# Patient Record
Sex: Male | Born: 1955 | Race: White | Hispanic: No | Marital: Married | Smoking: Never smoker
Health system: Southern US, Community
[De-identification: ages and names within clinical notes are randomized; demographics above are authoritative.]

## PROBLEM LIST (undated history)

## (undated) DIAGNOSIS — I10 Essential (primary) hypertension: Secondary | ICD-10-CM

## (undated) DIAGNOSIS — K219 Gastro-esophageal reflux disease without esophagitis: Secondary | ICD-10-CM

## (undated) DIAGNOSIS — Z978 Presence of other specified devices: Secondary | ICD-10-CM

## (undated) DIAGNOSIS — R339 Retention of urine, unspecified: Secondary | ICD-10-CM

## (undated) DIAGNOSIS — Z87442 Personal history of urinary calculi: Secondary | ICD-10-CM

## (undated) DIAGNOSIS — C61 Malignant neoplasm of prostate: Secondary | ICD-10-CM

## (undated) HISTORY — PX: OTHER SURGICAL HISTORY: SHX169

---

## 2001-03-06 ENCOUNTER — Emergency Department (HOSPITAL_COMMUNITY): Admission: EM | Admit: 2001-03-06 | Discharge: 2001-03-06 | Payer: Self-pay | Admitting: *Deleted

## 2005-05-19 ENCOUNTER — Ambulatory Visit (HOSPITAL_COMMUNITY): Admission: RE | Admit: 2005-05-19 | Discharge: 2005-05-19 | Payer: Self-pay | Admitting: Orthopedic Surgery

## 2005-05-19 ENCOUNTER — Ambulatory Visit (HOSPITAL_BASED_OUTPATIENT_CLINIC_OR_DEPARTMENT_OTHER): Admission: RE | Admit: 2005-05-19 | Discharge: 2005-05-19 | Payer: Self-pay | Admitting: Orthopedic Surgery

## 2007-12-20 ENCOUNTER — Encounter: Admission: RE | Admit: 2007-12-20 | Discharge: 2007-12-20 | Payer: Self-pay | Admitting: Family Medicine

## 2010-04-16 ENCOUNTER — Ambulatory Visit: Payer: Self-pay | Admitting: Diagnostic Radiology

## 2010-04-16 ENCOUNTER — Ambulatory Visit (HOSPITAL_BASED_OUTPATIENT_CLINIC_OR_DEPARTMENT_OTHER): Admission: RE | Admit: 2010-04-16 | Discharge: 2010-04-16 | Payer: Self-pay | Admitting: Family Medicine

## 2011-03-21 NOTE — Op Note (Signed)
NAME:  [name] [name], [name] [name]               ACCOUNT NO.:  [id number]   MEDICAL RECORD NO.:  [id number]          PATIENT TYPE:  AMB   LOCATION:  DSC                          FACILITY:  [facility]   PHYSICIAN:  Harvie Junior, M.D.   DATE OF BIRTH:  Mar 15, 1956   DATE OF PROCEDURE:  05/19/2005  DATE OF DISCHARGE:                                 OPERATIVE REPORT   PREOPERATIVE DIAGNOSIS:  Biceps tendon tear, complete, left.   POSTOPERATIVE DIAGNOSIS:  Biceps tendon tear, complete, left.   PRINCIPAL PROCEDURE:  Repair of left distal biceps tendon with an  EndoButton.   SURGEON:  Harvie Junior, M.D.   ASSISTANT:  Marshia Ly, P.A.   ANESTHESIA:  General.   BRIEF HISTORY:  Mr. [name] [name] is a 55 year old male with a long history of  having torn his left biceps tendon at work.  He ultimately was evaluated and  noted to have a distal biceps tendon tear.  He was seen by an outside  physician, who ordered an MRI which confirmed this.  We had a long talk  about treatment options and ultimately felt this needed to be addressed  soon.  We evaluated him under Workers Comp and told the Workers Insurance underwriter.  We wrote in our note that this needed to be done within a week  to 10 days, but Workers Comp decided to drag their feet for a month and then  ultimately he was brought to the operating room almost five weeks after his  injury.  At that point we knew that there was going to be significant  increased risk of dissection, significant increased risk of neurovascular  compromise and injury, but did feel that his results should be superior with  fixation versus leaving this alone.  He was brought to the operating room  for this procedure.   PROCEDURE:  The patient brought to the operating room and after adequate  anesthesia obtained with a general anesthetic, the patient was positioned  supine on the operating table.  The left arm was prepped and draped the  usual sterile fashion.  Following this, a  curved incision was made through  the armpit to allow easy and full access and exposure of the scarred area so  that we could identify the distal biceps well.  At this point a large scar  mass was identified as well as pseudotendon tracking down to the radial  tuberosity.  There was a portion of this scar tissue which tracked medially  and obviously was of some concern as to exactly what the structure was  tracking medially.  At this point the scar mass was opened slightly and as  we did this, identified the normal tendinous structure which was at this  level.  The scar was then dissected free with a pair of scissors to identify  the tendinous portion as it attached to the biceps, and a gloved finger was  used to free up the biceps and muscle approximately where it had also begun  to scar in.  This pseudotendon was very dense and very much concerning for  having neurovascular structures, although I felt that there were no  identifiable neurovascular structures in this large scar mass.  This portion  of the case was an additional 45 minutes of dissection and concern because  of the delay to surgical intervention, making this case much more difficult  with a much higher risk of neurovascular injury.  At any rate, once we were  able to free the tendon up and as best we could identify the structures,  although it was very difficult in the scar tissue that was there because of  the delay to surgery, we ultimately did a [place]-style stitch, which  addressed the biceps tendon very nicely, no bunching of the tendon, and was  able to pull that down quite well.  At this point, this #2 Fibrewire stitch  was passed through an EndoButton and then back up into the tendon and then  tied distally in the tendon so that there was no knot that needed to go down  into the tunnel.  Attention was turned to the radial tuberosity, where a  quarter-inch drill was used and two holes were made, and the bone bridge  was  connected with a rongeur.  All excess bone was irrigated out thoroughly at  this point.  At this point, the Beath needle puller was used to identify the  location of the radial tuberosity.  At this point, the Beath needle was  advanced through this hole in the radius and out the back and through the  skin.  This was overdrilled with the cannulated drill and then the  EndoButton was advanced with different-colored sutures through this hole  under direct fluoroscopic imaging with the arm flexed up.  The tendon was  advanced through and then the toggle sutures were toggled for the EndoButton  to fit down on the posterior radius.  Once this had been accomplished, the  arm was put through a range of motion.  Easy full extension could be  achieved, and flexion was without any concern.  At this point the wound was  copiously irrigated and suctioned dry.  A final check was made for this  area, but the scar tissue that was there was debrided and obviously at this  point, the skin was closed with 0 and 2-0 Vicryl and a 3-0 Maxon pull-out  suture.  Benzoin and Steri-Strips were applied and the patient was taken to  the recovery room, where he was noted to be in satisfactory condition with a  90 degree splint with the arm in slight supination.  At this point, the  estimated blood loss for the procedure was none.  The complications were  none identified, although again there was obvious concern for difficult  visualization and significant dissection of scar tissue and concern for  neurovascular structures in the front half of the forearm.       JLG/MEDQ  D:  05/19/2005  T:  05/20/2005  Job:  [id number]

## 2013-11-03 DIAGNOSIS — C61 Malignant neoplasm of prostate: Secondary | ICD-10-CM

## 2013-11-03 HISTORY — DX: Malignant neoplasm of prostate: C61

## 2014-05-29 ENCOUNTER — Other Ambulatory Visit: Payer: Self-pay | Admitting: Urology

## 2014-06-30 ENCOUNTER — Encounter (HOSPITAL_COMMUNITY): Payer: Self-pay | Admitting: Pharmacy Technician

## 2014-07-03 NOTE — Patient Instructions (Signed)
[name] [name]  07/03/2014   Your procedure is scheduled on:  07/12/2014    Report to [facility].  Follow the Signs to [place] at    1045    am  Call this number if you have problems the morning of surgery: [phone]   Remember:   Do not eat food or drink liquids after midnight.   Take these medicines the morning of surgery with A SIP OF WATER:    Do not wear jewelry,   Do not wear lotions, powders, or perfumes. , deodorant    Men may shave face and neck.  Do not bring valuables to the hospital.  Contacts, dentures or bridgework may not be worn into surgery.  Leave suitcase in the car. After surgery it may be brought to your room.  For patients admitted to the hospital, checkout time is 11:00 AM the day of  discharge.          Please read over the following fact sheets that you were given: [facility] - Preparing for Surgery Before surgery, you can play an important role.  Because skin is not sterile, your skin needs to be as free of germs as possible.  You can reduce the number of germs on your skin by washing with CHG (chlorahexidine gluconate) soap before surgery.  CHG is an antiseptic cleaner which kills germs and bonds with the skin to continue killing germs even after washing. Please DO NOT use if you have an allergy to CHG or antibacterial soaps.  If your skin becomes reddened/irritated stop using the CHG and inform your nurse when you arrive at Short Stay. Do not shave (including legs and underarms) for at least 48 hours prior to the first CHG shower.  You may shave your face/neck. Please follow these instructions carefully:  1.  Shower with CHG Soap the night before surgery and the  morning of Surgery.  2.  If you choose to wash your hair, wash your hair first as usual with your  normal  shampoo.  3.  After you shampoo, rinse your hair and body thoroughly to remove the  shampoo.                           4.  Use CHG as you would any other liquid soap.   You can apply chg directly  to the skin and wash                       Gently with a scrungie or clean washcloth.  5.  Apply the CHG Soap to your body ONLY FROM THE NECK DOWN.   Do not use on face/ open                           Wound or open sores. Avoid contact with eyes, ears mouth and genitals (private parts).                       Wash face,  Genitals (private parts) with your normal soap.             6.  Wash thoroughly, paying special attention to the area where your surgery  will be performed.  7.  Thoroughly rinse your body with warm water from the neck down.  8.  DO NOT shower/wash with your normal soap after using and  rinsing off  the CHG Soap.                9.  Pat yourself dry with a clean towel.            10.  Wear clean pajamas.            11.  Place clean sheets on your bed the night of your first shower and do not  sleep with pets. Day of Surgery : Do not apply any lotions/deodorants the morning of surgery.  Please wear clean clothes to the hospital/surgery center.  FAILURE TO FOLLOW THESE INSTRUCTIONS MAY RESULT IN THE CANCELLATION OF YOUR SURGERY PATIENT SIGNATURE_________________________________  NURSE SIGNATURE__________________________________  ________________________________________________________________________  WHAT IS A BLOOD TRANSFUSION? Blood Transfusion Information  A transfusion is the replacement of blood or some of its parts. Blood is made up of multiple cells which provide different functions.  Red blood cells carry oxygen and are used for blood loss replacement.  White blood cells fight against infection.  Platelets control bleeding.  Plasma helps clot blood.  Other blood products are available for specialized needs, such as hemophilia or other clotting disorders. BEFORE THE TRANSFUSION  Who gives blood for transfusions?   Healthy volunteers who are fully evaluated to make sure their blood is safe. This is blood bank blood. Transfusion  therapy is the safest it has ever been in the practice of medicine. Before blood is taken from a donor, a complete history is taken to make sure that person has no history of diseases nor engages in risky social behavior (examples are intravenous drug use or sexual activity with multiple partners). The donor's travel history is screened to minimize risk of transmitting infections, such as malaria. The donated blood is tested for signs of infectious diseases, such as HIV and hepatitis. The blood is then tested to be sure it is compatible with you in order to minimize the chance of a transfusion reaction. If you or a relative donates blood, this is often done in anticipation of surgery and is not appropriate for emergency situations. It takes many days to process the donated blood. RISKS AND COMPLICATIONS Although transfusion therapy is very safe and saves many lives, the main dangers of transfusion include:   Getting an infectious disease.  Developing a transfusion reaction. This is an allergic reaction to something in the blood you were given. Every precaution is taken to prevent this. The decision to have a blood transfusion has been considered carefully by your caregiver before blood is given. Blood is not given unless the benefits outweigh the risks. AFTER THE TRANSFUSION  Right after receiving a blood transfusion, you will usually feel much better and more energetic. This is especially true if your red blood cells have gotten low (anemic). The transfusion raises the level of the red blood cells which carry oxygen, and this usually causes an energy increase.  The nurse administering the transfusion will monitor you carefully for complications. HOME CARE INSTRUCTIONS  No special instructions are needed after a transfusion. You may find your energy is better. Speak with your caregiver about any limitations on activity for underlying diseases you may have. SEEK MEDICAL CARE IF:   Your condition is  not improving after your transfusion.  You develop redness or irritation at the intravenous (IV) site. SEEK IMMEDIATE MEDICAL CARE IF:  Any of the following symptoms occur over the next 12 hours:  Shaking chills.  You have a temperature by mouth above 102 F (38.9 C),  not controlled by medicine.  Chest, back, or muscle pain.  People around you feel you are not acting correctly or are confused.  Shortness of breath or difficulty breathing.  Dizziness and fainting.  You get a rash or develop hives.  You have a decrease in urine output.  Your urine turns a dark color or changes to pink, red, or brown. Any of the following symptoms occur over the next 10 days:  You have a temperature by mouth above 102 F (38.9 C), not controlled by medicine.  Shortness of breath.  Weakness after normal activity.  The white part of the eye turns yellow (jaundice).  You have a decrease in the amount of urine or are urinating less often.  Your urine turns a dark color or changes to pink, red, or brown. Document Released: 10/17/2000 Document Revised: 01/12/2012 Document Reviewed: 06/05/2008 ExitCare Patient Information 2014 [place].  _______________________________________________________________________, coughing and deep breathing exercises, leg exercises

## 2014-07-04 ENCOUNTER — Encounter (INDEPENDENT_AMBULATORY_CARE_PROVIDER_SITE_OTHER): Payer: Self-pay

## 2014-07-04 ENCOUNTER — Encounter (HOSPITAL_COMMUNITY): Payer: Self-pay

## 2014-07-04 ENCOUNTER — Encounter (HOSPITAL_COMMUNITY)
Admission: RE | Admit: 2014-07-04 | Discharge: 2014-07-04 | Disposition: A | Discharge status: Home / Self Care | Payer: Federal, State, Local not specified - PPO | Source: Ambulatory Visit | Attending: Urology | Admitting: Urology

## 2014-07-04 DIAGNOSIS — Z01818 Encounter for other preprocedural examination: Secondary | ICD-10-CM | POA: Insufficient documentation

## 2014-07-04 DIAGNOSIS — C61 Malignant neoplasm of prostate: Secondary | ICD-10-CM | POA: Insufficient documentation

## 2014-07-04 HISTORY — DX: Gastro-esophageal reflux disease without esophagitis: K21.9

## 2014-07-04 LAB — BASIC METABOLIC PANEL
ANION GAP: 14 (ref 5–15)
BUN: 13 mg/dL (ref 6–23)
CALCIUM: 9.7 mg/dL (ref 8.4–10.5)
CHLORIDE: 98 meq/L (ref 96–112)
CO2: 26 meq/L (ref 19–32)
CREATININE: 0.91 mg/dL (ref 0.50–1.35)
GFR calc Af Amer: >90 mL/min (ref >90)
GFR calc non Af Amer: >90 mL/min (ref >90)
Glucose, Bld: 105 mg/dL — ABNORMAL HIGH (ref 70–99)
Potassium: 4.4 mEq/L (ref 3.7–5.3)
SODIUM: 138 meq/L (ref 137–147)

## 2014-07-04 LAB — CBC
HEMATOCRIT: 48.9 % (ref 39.0–52.0)
HEMOGLOBIN: 17.6 g/dL — AB (ref 13.0–17.0)
MCH: 32.4 pg (ref 26.0–34.0)
MCHC: 36 g/dL (ref 30.0–36.0)
MCV: 90.1 fL (ref 78.0–100.0)
Platelets: 213 10*3/uL (ref 150–400)
RBC: 5.43 MIL/uL (ref 4.22–5.81)
RDW: 12.6 % (ref 11.5–15.5)
WBC: 5.6 10*3/uL (ref 4.0–10.5)

## 2014-07-04 NOTE — Progress Notes (Signed)
Pt scored 5 on stop bang apnea score tool on preadmit appt on 07/04/2014 [facility]

## 2014-07-12 ENCOUNTER — Inpatient Hospital Stay (HOSPITAL_COMMUNITY): Payer: Federal, State, Local not specified - PPO | Admitting: Anesthesiology

## 2014-07-12 ENCOUNTER — Encounter (HOSPITAL_COMMUNITY): Payer: Federal, State, Local not specified - PPO | Admitting: Anesthesiology

## 2014-07-12 ENCOUNTER — Encounter (HOSPITAL_COMMUNITY)
Admission: RE | Disposition: A | Discharge status: Home / Self Care | Payer: Self-pay | Source: Ambulatory Visit | Attending: Urology

## 2014-07-12 ENCOUNTER — Inpatient Hospital Stay (HOSPITAL_COMMUNITY)
Admission: RE | Admit: 2014-07-12 | Discharge: 2014-07-13 | DRG: 708 | Disposition: A | Discharge status: Home / Self Care | Payer: Federal, State, Local not specified - PPO | Source: Ambulatory Visit | Attending: Urology | Admitting: Urology

## 2014-07-12 ENCOUNTER — Encounter (HOSPITAL_COMMUNITY): Payer: Self-pay | Admitting: Registered Nurse

## 2014-07-12 DIAGNOSIS — Z860100 Personal history of colon polyps, unspecified: Secondary | ICD-10-CM

## 2014-07-12 DIAGNOSIS — C61 Malignant neoplasm of prostate: Principal | ICD-10-CM | POA: Diagnosis present

## 2014-07-12 DIAGNOSIS — K219 Gastro-esophageal reflux disease without esophagitis: Secondary | ICD-10-CM | POA: Diagnosis present

## 2014-07-12 DIAGNOSIS — Z87891 Personal history of nicotine dependence: Secondary | ICD-10-CM | POA: Diagnosis not present

## 2014-07-12 DIAGNOSIS — Z01812 Encounter for preprocedural laboratory examination: Secondary | ICD-10-CM | POA: Diagnosis not present

## 2014-07-12 DIAGNOSIS — N529 Male erectile dysfunction, unspecified: Secondary | ICD-10-CM | POA: Diagnosis present

## 2014-07-12 HISTORY — PX: ROBOT ASSISTED LAPAROSCOPIC RADICAL PROSTATECTOMY: SHX5141

## 2014-07-12 HISTORY — PX: LYMPHADENECTOMY: SHX5960

## 2014-07-12 LAB — TYPE AND SCREEN
ABO/RH(D): O POS
Antibody Screen: NEGATIVE

## 2014-07-12 LAB — ABO/RH: ABO/RH(D): O POS

## 2014-07-12 LAB — HEMOGLOBIN AND HEMATOCRIT, BLOOD
HCT: 43.8 % (ref 39.0–52.0)
Hemoglobin: 15.5 g/dL (ref 13.0–17.0)

## 2014-07-12 SURGERY — ROBOTIC ASSISTED LAPAROSCOPIC RADICAL PROSTATECTOMY
Anesthesia: General

## 2014-07-12 MED ORDER — ROCURONIUM BROMIDE 100 MG/10ML IV SOLN
INTRAVENOUS | Status: DC | PRN
  Administered 2014-07-12: 5 mg via INTRAVENOUS
  Administered 2014-07-12: 20 mg via INTRAVENOUS
  Administered 2014-07-12: 5 mg via INTRAVENOUS
  Administered 2014-07-12: 55 mg via INTRAVENOUS

## 2014-07-12 MED ORDER — HYDROMORPHONE HCL PF 1 MG/ML IJ SOLN
INTRAMUSCULAR | Status: DC | PRN
  Administered 2014-07-12: 0.5 mg via INTRAVENOUS
  Administered 2014-07-12: 1 mg via INTRAVENOUS
  Administered 2014-07-12: 0.5 mg via INTRAVENOUS

## 2014-07-12 MED ORDER — NEOSTIGMINE METHYLSULFATE 10 MG/10ML IV SOLN
INTRAVENOUS | Status: AC
  Filled 2014-07-12: qty 1

## 2014-07-12 MED ORDER — GLYCOPYRROLATE 0.2 MG/ML IJ SOLN
INTRAMUSCULAR | Status: AC
  Filled 2014-07-12: qty 3

## 2014-07-12 MED ORDER — SODIUM CHLORIDE 0.9 % IJ SOLN
INTRAMUSCULAR | Status: AC
  Filled 2014-07-12: qty 10

## 2014-07-12 MED ORDER — NEOSTIGMINE METHYLSULFATE 10 MG/10ML IV SOLN
INTRAVENOUS | Status: DC | PRN
  Administered 2014-07-12: 4 mg via INTRAVENOUS

## 2014-07-12 MED ORDER — STERILE WATER FOR IRRIGATION IR SOLN
Status: DC | PRN
  Administered 2014-07-12: 3000 mL

## 2014-07-12 MED ORDER — SUFENTANIL CITRATE 50 MCG/ML IV SOLN
INTRAVENOUS | Status: AC
  Filled 2014-07-12: qty 1

## 2014-07-12 MED ORDER — ONDANSETRON HCL 4 MG/2ML IJ SOLN
INTRAMUSCULAR | Status: DC | PRN
  Administered 2014-07-12: 4 mg via INTRAVENOUS

## 2014-07-12 MED ORDER — MEPERIDINE HCL 50 MG/ML IJ SOLN: 6.2500 mg | INTRAMUSCULAR | Status: DC | PRN

## 2014-07-12 MED ORDER — HYDROMORPHONE HCL PF 1 MG/ML IJ SOLN
0.2500 mg | INTRAMUSCULAR | Status: DC | PRN
  Administered 2014-07-12 (×2): 0.5 mg via INTRAVENOUS

## 2014-07-12 MED ORDER — MIDAZOLAM HCL 5 MG/5ML IJ SOLN
INTRAMUSCULAR | Status: DC | PRN
  Administered 2014-07-12: 2 mg via INTRAVENOUS

## 2014-07-12 MED ORDER — ACETAMINOPHEN 500 MG PO TABS
1000.0000 mg | ORAL_TABLET | Freq: Four times a day (QID) | ORAL | Status: AC
  Administered 2014-07-12 – 2014-07-13 (×4): 1000 mg via ORAL
  Filled 2014-07-12 (×4): qty 2

## 2014-07-12 MED ORDER — LACTATED RINGERS IV SOLN
INTRAVENOUS | Status: DC
Start: 2014-07-12 — End: 2014-07-12
  Administered 2014-07-12: 1000 mL via INTRAVENOUS

## 2014-07-12 MED ORDER — BUPIVACAINE LIPOSOME 1.3 % IJ SUSP
20.0000 mL | Freq: Once | INTRAMUSCULAR | Status: AC
  Administered 2014-07-12: 20 mL
  Filled 2014-07-12: qty 20

## 2014-07-12 MED ORDER — GLYCOPYRROLATE 0.2 MG/ML IJ SOLN
INTRAMUSCULAR | Status: DC | PRN
  Administered 2014-07-12: 0.6 mg via INTRAVENOUS

## 2014-07-12 MED ORDER — PANTOPRAZOLE SODIUM 40 MG PO TBEC
40.0000 mg | DELAYED_RELEASE_TABLET | Freq: Every day | ORAL | Status: DC
  Administered 2014-07-13: 40 mg via ORAL
  Filled 2014-07-12: qty 1

## 2014-07-12 MED ORDER — CEFAZOLIN SODIUM-DEXTROSE 2-3 GM-% IV SOLR
INTRAVENOUS | Status: AC
  Filled 2014-07-12: qty 50

## 2014-07-12 MED ORDER — LABETALOL HCL 5 MG/ML IV SOLN
INTRAVENOUS | Status: AC
  Filled 2014-07-12: qty 4

## 2014-07-12 MED ORDER — SODIUM CHLORIDE 0.9 % IV BOLUS (SEPSIS)
1000.0000 mL | Freq: Once | INTRAVENOUS | Status: AC
  Administered 2014-07-12: 1000 mL via INTRAVENOUS

## 2014-07-12 MED ORDER — FENTANYL CITRATE 0.05 MG/ML IJ SOLN: INTRAMUSCULAR | Status: DC | PRN

## 2014-07-12 MED ORDER — ONDANSETRON HCL 4 MG/2ML IJ SOLN
INTRAMUSCULAR | Status: AC
  Filled 2014-07-12: qty 2

## 2014-07-12 MED ORDER — HYDROMORPHONE HCL PF 1 MG/ML IJ SOLN: 0.5000 mg | INTRAMUSCULAR | Status: DC | PRN

## 2014-07-12 MED ORDER — SUFENTANIL CITRATE 50 MCG/ML IV SOLN
INTRAVENOUS | Status: DC | PRN
  Administered 2014-07-12 (×4): 10 ug via INTRAVENOUS
  Administered 2014-07-12: 5 ug via INTRAVENOUS
  Administered 2014-07-12: 10 ug via INTRAVENOUS
  Administered 2014-07-12: 15 ug via INTRAVENOUS

## 2014-07-12 MED ORDER — PROPOFOL 10 MG/ML IV BOLUS
INTRAVENOUS | Status: AC
  Filled 2014-07-12: qty 20

## 2014-07-12 MED ORDER — HYDROMORPHONE HCL PF 1 MG/ML IJ SOLN
INTRAMUSCULAR | Status: AC
  Filled 2014-07-12: qty 1

## 2014-07-12 MED ORDER — CIPROFLOXACIN HCL 500 MG PO TABS: 500.0000 mg | ORAL_TABLET | Freq: Two times a day (BID) | ORAL | Status: DC

## 2014-07-12 MED ORDER — DEXAMETHASONE SODIUM PHOSPHATE 10 MG/ML IJ SOLN
INTRAMUSCULAR | Status: DC | PRN
  Administered 2014-07-12: 10 mg via INTRAVENOUS

## 2014-07-12 MED ORDER — PROPOFOL 10 MG/ML IV BOLUS
INTRAVENOUS | Status: DC | PRN
  Administered 2014-07-12: 200 mg via INTRAVENOUS

## 2014-07-12 MED ORDER — LACTATED RINGERS IR SOLN
Status: DC | PRN
  Administered 2014-07-12: 1000 mL

## 2014-07-12 MED ORDER — PROMETHAZINE HCL 25 MG/ML IJ SOLN: 6.2500 mg | INTRAMUSCULAR | Status: DC | PRN

## 2014-07-12 MED ORDER — MIDAZOLAM HCL 2 MG/2ML IJ SOLN
INTRAMUSCULAR | Status: AC
  Filled 2014-07-12: qty 2

## 2014-07-12 MED ORDER — DEXAMETHASONE SODIUM PHOSPHATE 10 MG/ML IJ SOLN
INTRAMUSCULAR | Status: AC
  Filled 2014-07-12: qty 1

## 2014-07-12 MED ORDER — LACTATED RINGERS IV SOLN: INTRAVENOUS | Status: DC

## 2014-07-12 MED ORDER — LABETALOL HCL 5 MG/ML IV SOLN
INTRAVENOUS | Status: DC | PRN
  Administered 2014-07-12 (×2): 5 mg via INTRAVENOUS

## 2014-07-12 MED ORDER — SODIUM CHLORIDE 0.9 % IJ SOLN
INTRAMUSCULAR | Status: DC | PRN
  Administered 2014-07-12: 20 mL via INTRAVENOUS

## 2014-07-12 MED ORDER — LIDOCAINE HCL (CARDIAC) 20 MG/ML IV SOLN
INTRAVENOUS | Status: DC | PRN
  Administered 2014-07-12: 75 mg via INTRAVENOUS
  Administered 2014-07-12: 25 mg via INTRATRACHEAL

## 2014-07-12 MED ORDER — DEXTROSE-NACL 5-0.45 % IV SOLN
INTRAVENOUS | Status: DC
  Administered 2014-07-12 – 2014-07-13 (×2): via INTRAVENOUS

## 2014-07-12 MED ORDER — ROCURONIUM BROMIDE 100 MG/10ML IV SOLN
INTRAVENOUS | Status: AC
  Filled 2014-07-12: qty 1

## 2014-07-12 MED ORDER — LACTATED RINGERS IV SOLN
INTRAVENOUS | Status: DC | PRN
  Administered 2014-07-12 (×2): via INTRAVENOUS

## 2014-07-12 MED ORDER — OMEPRAZOLE MAGNESIUM 20 MG PO TBEC: 20.0000 mg | DELAYED_RELEASE_TABLET | Freq: Every day | ORAL | Status: DC

## 2014-07-12 MED ORDER — HYDROMORPHONE HCL PF 2 MG/ML IJ SOLN
INTRAMUSCULAR | Status: AC
  Filled 2014-07-12: qty 1

## 2014-07-12 MED ORDER — LIDOCAINE HCL (CARDIAC) 20 MG/ML IV SOLN
INTRAVENOUS | Status: AC
  Filled 2014-07-12: qty 5

## 2014-07-12 MED ORDER — OXYCODONE-ACETAMINOPHEN 5-325 MG PO TABS
1.0000 | ORAL_TABLET | ORAL | Status: DC | PRN
  Filled 2014-07-12: qty 1

## 2014-07-12 MED ORDER — SUCCINYLCHOLINE CHLORIDE 20 MG/ML IJ SOLN
INTRAMUSCULAR | Status: DC | PRN
  Administered 2014-07-12: 100 mg via INTRAVENOUS

## 2014-07-12 MED ORDER — HYDROCODONE-ACETAMINOPHEN 5-325 MG PO TABS: 1.0000 | ORAL_TABLET | Freq: Four times a day (QID) | ORAL | Status: DC | PRN

## 2014-07-12 MED ORDER — CEFAZOLIN SODIUM-DEXTROSE 2-3 GM-% IV SOLR
2.0000 g | INTRAVENOUS | Status: AC
  Administered 2014-07-12: 2 g via INTRAVENOUS

## 2014-07-12 SURGICAL SUPPLY — 55 items
ADH SKN CLS APL DERMABOND .7 (GAUZE/BANDAGES/DRESSINGS) ×2
CABLE HIGH FREQUENCY MONO STRZ (ELECTRODE) ×4 IMPLANT
CANISTER SUCTION 2500CC (MISCELLANEOUS) ×2 IMPLANT
CATH FOLEY 2WAY SLVR 18FR 30CC (CATHETERS) ×4 IMPLANT
CATH TIEMANN FOLEY 18FR 5CC (CATHETERS) ×4 IMPLANT
CHLORAPREP W/TINT 26ML (MISCELLANEOUS) ×4 IMPLANT
CLIP LIGATING HEM O LOK PURPLE (MISCELLANEOUS) ×14 IMPLANT
CLOTH BEACON ORANGE TIMEOUT ST (SAFETY) ×4 IMPLANT
CONT SPEC 4OZ CLIKSEAL STRL BL (MISCELLANEOUS) ×2 IMPLANT
COVER SURGICAL LIGHT HANDLE (MISCELLANEOUS) ×4 IMPLANT
COVER TIP SHEARS 8 DVNC (MISCELLANEOUS) ×2 IMPLANT
COVER TIP SHEARS 8MM DA VINCI (MISCELLANEOUS) ×2
CUTTER ECHEON FLEX ENDO 45 340 (ENDOMECHANICALS) ×4 IMPLANT
DECANTER SPIKE VIAL GLASS SM (MISCELLANEOUS) ×2 IMPLANT
DERMABOND ADVANCED (GAUZE/BANDAGES/DRESSINGS) ×2
DERMABOND ADVANCED .7 DNX12 (GAUZE/BANDAGES/DRESSINGS) ×2 IMPLANT
DRSG TEGADERM 4X4.75 (GAUZE/BANDAGES/DRESSINGS) ×6 IMPLANT
DRSG TEGADERM 6X8 (GAUZE/BANDAGES/DRESSINGS) ×4 IMPLANT
ELECT REM PT RETURN 9FT ADLT (ELECTROSURGICAL) ×4
ELECTRODE REM PT RTRN 9FT ADLT (ELECTROSURGICAL) ×2 IMPLANT
GAUZE SPONGE 2X2 8PLY STRL LF (GAUZE/BANDAGES/DRESSINGS) ×2 IMPLANT
GLOVE BIO SURGEON STRL SZ 6.5 (GLOVE) ×3 IMPLANT
GLOVE BIO SURGEONS STRL SZ 6.5 (GLOVE) ×1
GLOVE BIOGEL M STRL SZ7.5 (GLOVE) ×8 IMPLANT
GLOVE BIOGEL PI IND STRL 7.5 (GLOVE) IMPLANT
GLOVE BIOGEL PI INDICATOR 7.5 (GLOVE) ×2
GOWN STRL REUS W/TWL LRG LVL4 (GOWN DISPOSABLE) ×12 IMPLANT
HOLDER FOLEY CATH W/STRAP (MISCELLANEOUS) ×4 IMPLANT
IV LACTATED RINGERS 1000ML (IV SOLUTION) ×2 IMPLANT
KIT ACCESSORY DA VINCI DISP (KITS) ×2
KIT ACCESSORY DVNC DISP (KITS) IMPLANT
KIT PROCEDURE DA VINCI SI (MISCELLANEOUS) ×2
KIT PROCEDURE DVNC SI (MISCELLANEOUS) ×2 IMPLANT
NDL INSUFFLATION 14GA 120MM (NEEDLE) ×2 IMPLANT
NEEDLE INSUFFLATION 14GA 120MM (NEEDLE) ×4 IMPLANT
NEEDLE SPNL 22GX7 SPINOC (NEEDLE) IMPLANT
PACK ROBOT UROLOGY CUSTOM (CUSTOM PROCEDURE TRAY) ×4 IMPLANT
RELOAD GREEN ECHELON 45 (STAPLE) ×4 IMPLANT
SET TUBE IRRIG SUCTION NO TIP (IRRIGATION / IRRIGATOR) ×4 IMPLANT
SOLUTION ELECTROLUBE (MISCELLANEOUS) ×4 IMPLANT
SPONGE GAUZE 2X2 STER 10/PKG (GAUZE/BANDAGES/DRESSINGS) ×2
SPONGE LAP 4X18 X RAY DECT (DISPOSABLE) ×4 IMPLANT
SUT ETHILON 3 0 PS 1 (SUTURE) ×4 IMPLANT
SUT MNCRL AB 4-0 PS2 18 (SUTURE) ×8 IMPLANT
SUT PDS AB 1 CT1 27 (SUTURE) ×8 IMPLANT
SUT VIC AB 2-0 SH 27 (SUTURE) ×4
SUT VIC AB 2-0 SH 27X BRD (SUTURE) ×2 IMPLANT
SUT VICRYL 0 UR6 27IN ABS (SUTURE) ×4 IMPLANT
SUT VLOC BARB 180 ABS3/0GR12 (SUTURE) ×24
SUTURE VLOC BRB 180 ABS3/0GR12 (SUTURE) ×6 IMPLANT
SYR 27GX1/2 1ML LL SAFETY (SYRINGE) ×4 IMPLANT
TOWEL OR 17X26 10 PK STRL BLUE (TOWEL DISPOSABLE) ×4 IMPLANT
TOWEL OR NON WOVEN STRL DISP B (DISPOSABLE) ×4 IMPLANT
TROCAR 12M 150ML BLUNT (TROCAR) ×4 IMPLANT
WATER STERILE IRR 1500ML POUR (IV SOLUTION) ×4 IMPLANT

## 2014-07-12 NOTE — Brief Op Note (Signed)
07/12/2014  4:11 PM  PATIENT:  [name] [name]  58 y.o. male  PRE-OPERATIVE DIAGNOSIS:  PROSTATE CANCER  POST-OPERATIVE DIAGNOSIS:  PROSTATE CANCER  PROCEDURE:  Procedure(s) with comments: ROBOTIC ASSISTED LAPAROSCOPIC RADICAL PROSTATECTOMY , INDOCYANINE GREEN DYE INJECTION (N/A) - 3.5 HRS  PELVIC LYMPH NODE DISSECTION (Bilateral)  SURGEON:  Surgeon(s) and Role:    * Alexis Frock, MD - Primary  PHYSICIAN ASSISTANT:   ASSISTANTS: Felipa Furnace, PA   ANESTHESIA:   general  EBL:  Total I/O In: 1000 [I.V.:1000] Out: 250 [Blood:250]  BLOOD ADMINISTERED:none  DRAINS: 1 - foley, 2 - JP to bulb   LOCAL MEDICATIONS USED:  MARCAINE     SPECIMEN:  Source of Specimen:  1 - prostatectomy, 2 - revised bladder neck margin, 3 - bilateral pelvic lymph nodes, 4 - periprostatic fat, 5 - left pervesical lymph nodes sentinal  DISPOSITION OF SPECIMEN:  PATHOLOGY  COUNTS:  YES  TOURNIQUET:  * No tourniquets in log *  DICTATION: .Other Dictation: Dictation Number [id number]  PLAN OF CARE: Admit to inpatient   PATIENT DISPOSITION:  PACU - hemodynamically stable.   Delay start of Pharmacological VTE agent (>24hrs) due to surgical blood loss or risk of bleeding: yes

## 2014-07-12 NOTE — Anesthesia Postprocedure Evaluation (Signed)
  Anesthesia Post-op Note  Patient: [name] [name]  Procedure(s) Performed: Procedure(s) (LRB): ROBOTIC ASSISTED LAPAROSCOPIC RADICAL PROSTATECTOMY , INDOCYANINE GREEN DYE INJECTION (N/A) PELVIC LYMPH NODE DISSECTION (Bilateral)  Patient Location: PACU  Anesthesia Type: General  Level of Consciousness: awake and alert   Airway and Oxygen Therapy: Patient Spontanous Breathing  Post-op Pain: mild  Post-op Assessment: Post-op Vital signs reviewed, Patient's Cardiovascular Status Stable, Respiratory Function Stable, Patent Airway and No signs of Nausea or vomiting  Last Vitals:  Filed Vitals:   07/12/14 1700  BP: 140/79  Pulse: 67  Temp: 37.1 C  Resp: 13    Post-op Vital Signs: stable   Complications: No apparent anesthesia complications

## 2014-07-12 NOTE — Discharge Instructions (Signed)

## 2014-07-12 NOTE — Anesthesia Preprocedure Evaluation (Signed)

## 2014-07-12 NOTE — H&P (Signed)
[name] [name] is an 58 y.o. male.    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 TRUS 46mL. Biopsy 05/2014 with 4+3=LLB, LLM; 3+4=7 RLB, RMM; 3+3=6 RMB, RMM, LMM. [facility] nomogram predicts 75% chance ECE, 49% 5 year progression free, 22% chance + nodes.  2 - Erectile Dysfunction -  Presently manages wtih Levtira 10mg  prn per PCP with satisfaciton.  PMH sig for colon polyps, GERD. No CV disease. No prior chest / abd surgeries.  Today [name] [name] is seen in f/u above and discuss new prostate cancer diagnosis.   Past Medical History  Diagnosis Date  . GERD (gastroesophageal reflux disease)   . Cancer     prostate    Past Surgical History  Procedure Laterality Date  . Left bicep surgery      reattached  . Mucous seal      removed from lip    No family history on file. Social History:  reports that he has never smoked. He quit smokeless tobacco use about 20 years ago. His smokeless tobacco use included Chew. He reports that he drinks about 1.2 ounces of alcohol per week. He reports that he does not use illicit drugs.  Allergies:  Allergies  Allergen Reactions  . Tetracyclines & Related Rash    No prescriptions prior to admission    No results found for this or any previous visit (from the past 48 hour(s)). No results found.  Review of Systems  Constitutional: Negative.  Negative for fever and chills.  HENT: Negative.   Eyes: Negative.   Respiratory: Negative.   Cardiovascular: Negative.   Gastrointestinal: Negative.  Negative for nausea and vomiting.  Genitourinary: Negative.   Musculoskeletal: Negative.   Skin: Negative.   Neurological: Negative.   Endo/Heme/Allergies: Negative.   Psychiatric/Behavioral: Negative.     There were no vitals taken for this visit. Physical Exam  Constitutional: He is oriented to person, place, and time. He  appears well-developed.  HENT:  Head: Normocephalic.  Eyes: Pupils are equal, round, and reactive to light.  Neck: Normal range of motion.  Cardiovascular: Normal rate.   Respiratory: Effort normal.  GI: Soft.  Genitourinary:  No CVAT  Musculoskeletal: Normal range of motion.  Neurological: He is alert and oriented to person, place, and time.  Skin: Skin is warm and dry.  Psychiatric: He has a normal mood and affect. His behavior is normal. Judgment and thought content normal.     Assessment/Plan  1 - Large Volume Moderate Risk Prostate Cancer -  We rediscussed prostatectomy and specifically robotic prostatectomy with bilateral pelvic lymphadenectomy being the technique that I most commonly perform. I showed the patient on their abdomen the approximately 6 small incision (trocar) sites as well as presumed extraction sites with robotic approach as well as possible open incision sites should open conversion be necessary. We rediscussed peri-operative risks including bleeding, infection, deep vein thrombosis, pulmonary embolism, compartment syndrome, nuropathy / neuropraxia, heart attack, stroke, death, as well as long-term risks such as non-cure / need for additional therapy. We specifically readdressed that the procedure would compromise urinary control leading to stress incontinence which typically resolves with time and pelvic rehabilitation (Kegel's, etc..), but can sometimes be permanent and require additional therapy including surgery. We also specifically readdressed sexual sequellae including significant erectile dysfunction which typically partially resolves with time but can also be permanent and require  additional therapy including surgery.   We rediscussed the typical hospital course including usual 1-2 night hospitalization, discharge with foley catheter in place usually for 1-2 weeks before voiding trial as well as usually 2 week recovery until able to perform most non-strenuous  activity and 6 weeks until able to return to most jobs and more strenuous activity such as exercise.    2 - Erectile Dysfunction - Continue Levitra. We briefly discussed generic sildenefil as an option as well. We also mentioned that ANY active treatment of his prostate cancer will worsen this some.     Milon Dethloff 07/12/2014, 6:42 AM

## 2014-07-12 NOTE — Transfer of Care (Signed)
Immediate Anesthesia Transfer of Care Note  Patient: [name] [name]  Procedure(s) Performed: Procedure(s) with comments: ROBOTIC ASSISTED LAPAROSCOPIC RADICAL PROSTATECTOMY , INDOCYANINE GREEN DYE INJECTION (N/A) - 3.5 HRS  PELVIC LYMPH NODE DISSECTION (Bilateral)  Patient Location: PACU  Anesthesia Type:General  Level of Consciousness: awake, alert , oriented and patient cooperative  Airway & Oxygen Therapy: Patient Spontanous Breathing and Patient connected to face mask oxygen  Post-op Assessment: Report given to PACU RN and Post -op Vital signs reviewed and stable  Post vital signs: Reviewed and stable  Complications: No apparent anesthesia complications

## 2014-07-13 ENCOUNTER — Encounter (HOSPITAL_COMMUNITY): Payer: Self-pay | Admitting: Urology

## 2014-07-13 LAB — HEMOGLOBIN AND HEMATOCRIT, BLOOD
HEMATOCRIT: 40.4 % (ref 39.0–52.0)
Hemoglobin: 14.1 g/dL (ref 13.0–17.0)

## 2014-07-13 LAB — BASIC METABOLIC PANEL
Anion gap: 11 (ref 5–15)
BUN: 10 mg/dL (ref 6–23)
CO2: 23 meq/L (ref 19–32)
CREATININE: 0.89 mg/dL (ref 0.50–1.35)
Calcium: 8.5 mg/dL (ref 8.4–10.5)
Chloride: 97 mEq/L (ref 96–112)
GFR calc non Af Amer: >90 mL/min (ref >90)
GLUCOSE: 147 mg/dL — AB (ref 70–99)
POTASSIUM: 4.1 meq/L (ref 3.7–5.3)
Sodium: 131 mEq/L — ABNORMAL LOW (ref 137–147)

## 2014-07-13 MED ORDER — ONDANSETRON HCL 4 MG/2ML IJ SOLN
4.0000 mg | Freq: Four times a day (QID) | INTRAMUSCULAR | Status: DC | PRN
  Administered 2014-07-13: 4 mg via INTRAVENOUS
  Filled 2014-07-13: qty 2

## 2014-07-13 MED ORDER — SENNOSIDES-DOCUSATE SODIUM 8.6-50 MG PO TABS: 1.0000 | ORAL_TABLET | Freq: Two times a day (BID) | ORAL | Status: DC

## 2014-07-13 NOTE — Progress Notes (Signed)
Patient discharged home with wife, discharge instructions given and explained to patient/wife, demonstrated foley/leg bag management/care at home and they verbalized understanding, patient denies any pain/distress. Surgical incision intact, no sign of infection noted, no other wound noted. Accompanied home by son and wife.

## 2014-07-13 NOTE — Discharge Summary (Signed)
Physician Discharge Summary  Patient ID: [name] [name] MRN: [id number] DOB/AGE: 12-08-1955 58 y.o.  Admit date: 07/12/2014 Discharge date: 07/13/2014  Admission Diagnoses: Prostate Cancer  Discharge Diagnoses:  Active Problems:   Malignant neoplasm of prostate   Discharged Condition: good  Hospital Course:   1 - Moderate Risk Prostate Cancer - s/p robotic prostatectomy 07/12/2014. Path pending. By the afternoon of 9/10 the patient is ambulatory, pain controlled with PO meds, tollerating PO intake, and felt to be adequate for discharge. JP removed prior to discharge as output scant.    Consults: None  Significant Diagnostic Studies: labs: Hgb >12.  Treatments: surgery:  robotic prostatectomy 07/12/2014  Discharge Exam: Blood pressure 148/61, pulse 76, temperature 98.4 F (36.9 C), temperature source Oral, resp. rate 19, height 5\' 8"  (1.727 m), weight 88.451 kg (195 lb), SpO2 100.00%. General appearance: alert, cooperative, appears stated age and wife at bedside Head: Normocephalic, without obvious abnormality, atraumatic Throat: lips, mucosa, and tongue normal; teeth and gums normal Neck: supple, symmetrical, trachea midline Back: symmetric, no curvature. ROM normal. No CVA tenderness. Resp: non-labored on room air Cardio: Nl rate GI: soft, non-tender; bowel sounds normal; no masses,  no organomegaly Male genitalia: normal, foley c/d/i with clear urine Extremities: extremities normal, atraumatic, no cyanosis or edema Pulses: 2+ and symmetric Skin: Skin color, texture, turgor normal. No rashes or lesions Lymph nodes: Cervical, supraclavicular, and axillary nodes normal. Neurologic: Grossly normal Incision/Wound: recent port sites, extraction site c/d/i. JP removed and dry dressing applied.   Disposition: Final discharge disposition not confirmed     Medication List         ciprofloxacin 500 MG tablet  Commonly known as:  CIPRO  Take 1 tablet (500 mg total) by mouth 2  (two) times daily. Start day prior to office visit for foley removal     HYDROcodone-acetaminophen 5-325 MG per tablet  Commonly known as:  NORCO  Take 1-2 tablets by mouth every 6 (six) hours as needed.     omeprazole 20 MG tablet  Commonly known as:  PRILOSEC OTC  Take 20 mg by mouth daily.     senna-docusate 8.6-50 MG per tablet  Commonly known as:  Senokot-S  Take 1 tablet by mouth 2 (two) times daily. While taking pain meds to prevent constipation           Follow-up Information   Follow up with Alexis Frock, MD On 07/20/2014. (9:15)    Specialty:  Urology   Contact information:   [address] [place] [address] [phone]       Signed: Alexis Frock 07/13/2014, 4:12 PM

## 2014-07-13 NOTE — Care Management Note (Signed)
    Page 1 of 1   07/13/2014     2:24:07 PM CARE MANAGEMENT NOTE 07/13/2014  Patient:  [name] [name], [name] [name]   Account Number:  [id number]  Date Initiated:  07/13/2014  Documentation initiated by:  [facility]  Subjective/Objective Assessment:   [address] [place]     Action/Plan:   FROM HOME.   Anticipated DC Date:  07/14/2014   Anticipated DC Plan:  [place]  CM consult      Choice offered to / List presented to:             Status of service:  In process, will continue to follow Medicare Important Message given?   (If response is "NO", the following Medicare IM given date fields will be blank) Date Medicare IM given:   Medicare IM given by:   Date Additional Medicare IM given:   Additional Medicare IM given by:    Discharge Disposition:    Per UR Regulation:  Reviewed for med. necessity/level of care/duration of stay  If discussed at [place] of Stay Meetings, dates discussed:    Comments:  07/13/14 Webster Patrone RN,BSN [facility] [phone] POD#1 LAP RAD PROSTATECTOMY.NO ANTICIPATED D/C NEEDS.

## 2014-07-13 NOTE — Op Note (Signed)
NAMEHARPER, SMOKER NO.:  [id number]  MEDICAL RECORD NO.:  [id number]  LOCATION:  2992                         FACILITY:  [facility]  PHYSICIAN:  Alexis Frock, MD     DATE OF BIRTH:  14-Mar-1956  DATE OF PROCEDURE:  07/12/2014 DATE OF DISCHARGE:                              OPERATIVE REPORT   DIAGNOSIS:  Large volume moderate risk prostate cancer.  PROCEDURES: 1. Robotic-assisted laparoscopic radical prostatectomy. 2. Bilateral pelvic lymphadenectomy. 3. Injection of Indocyanine dye for sentinel lymphangiography.  SURGEON:  Alexis Frock, MD  ASSISTANT:  Leta Baptist, PA-C  ESTIMATED BLOOD LOSS:  250 mL.  SPECIMENS: 1. Radical prostatectomy. 2. Periprostatic fat. 3. Right external iliac lymph nodes. 4. Right obturator lymph nodes. 5. Left external iliac lymph nodes. 6. Left obturator lymph nodes. 7. Left perivesical lymph nodes, sentinel.  FINDINGS: 1. Area of hyperfluorescence lymphatic channels and questionable nodal     material in the left perivesical location, although pelvic and     perivesical lymph nodes afluorescent. 2. Small prostate median lobe.  Bladder neck not requiring     reconstruction.  INDICATION:  Mr. Lichtman is a pleasant 58 year old gentleman with history of elevated PSA.  He was found on workup of this to have a very large volume moderate risk prostate cancer with Gleason 7 disease, has multifocal including a base and lateral orientations.  He also is known to have an 80 g prostate.  Options were discussed for definitive therapy including ablated therapies versus surgical extirpation versus surveillance protocols, and he wished to proceed with surgery with minimally invasive approach.  Informed consent was obtained and placed in the medical record.  PROCEDURE IN DETAIL:  The patient being Dearl Rudden, procedure being robotic prostatectomy was confirmed.  Procedure was carried out.  Time- out was performed.   Intravenous antibiotics were administered.  General endotracheal anesthesia was introduced.  The patient was placed into a low lithotomy position and sterile field was created by prepping and draping the patient's infra-xiphoid abdomen using chlorhexidine gluconate in his penis, perineum, and proximal thighs using iodine x3.  All was performed after clipper shaving and employing the patient stabilization device for steep Trendelenberg repositioning.  He was then placed into a steep Trendelenburg position and found to be stable on the operative table and was suitably draped after protecting the arms.  Next, the high-flow low pressure pneumoperitoneum was obtained using Veress technique in the infraumbilical midline having passed the aspiration and drop test. After Foley catheter had been placed, a 12-mm robotic camera port was placed in this location.  Laparoscopic examination of peritoneal cavity revealed no significant adhesions and no visceral injury.  Additional ports were then placed as follows; right paramedian 8-mm robotic port, right far lateral 12-mm assist port, left paramedian 8-mm robotic port, left far lateral 8-mm robotic port, and a right 5-mm paramedian suction port.  Robot was docked and passed through electronic checks.  Initial attention was directed to the space of Retzius.  Incision was made lateral to the right medial umbilical ligament from the midline towards the area of the internal ring and coursing along the iliac vessels, towards the area of the ureter which  was positively identified. Dissection did not proceed medial to this.  The right lateral bladder was then swept away from the pelvic sidewall towards the area of the endopelvic fascia and mirror image dissection was performed on the left side and anterior attechemnts were taken down using cautery scissors.  This exposed the anterior base of the prostate which was defatted to better expose the bladder neck and  prostate junction.  This fatty tissue was set aside for permanent pathology, labeled periprostatic fat.  An 18-gauge Chiba needle was introduced in the suprapubic location and robotically guided, such that 0.2 mL of Indocyanine green dye were injected into each lobe of the prostate and intervening, suctioning, coagulation to avoid spillage which did not occur.  Next, the endopelvic fascia was carefully incised bilaterally in the lateral process away from the pelvic musculature in a base to apex orientation.  This exposed the area of the dorsal venous complex, which was controlled using vascular stapler, taking great care to avoid membranous urethral injury which did not occur.  At this point, it had been approximately 15 minutes post injection and the pelvis was interrogated under near infrared fluorescence.  Sentinel lymph angiography revealed no sentinel lymph nodes  packets such as the iliacs or obturator lymph nodes.  However, there was a serosal lymphatic channels noted coursing over the left perivesicular area with a questionable node and this was dissected and set aside for permanent pathology, labeled left perivesical lymph node, sentinel. Attention was directed at standard template left pelvic lymphadenectomy, first on the right side.  All fiber fatty tissue in the confines of the right external iliac artery vein and pelvic side wall were carefully mobilized.  Lymphostasis was achieved with cold clips.  This fiber fatty packet was set aside, labeled right external iliac lymph nodes.  Next, all fiber fatty tissue in the confines of the right external iliac vein, obturator nerve, pelvic sidewall was carefully mobilized and lymphostasis was achieved with cold clips.  This set aside, labeled right obturator lymph nodes.  The obturator nerve was inspected following these maneuvers, as of the ureter and found to be uninjured. Similarly, a mirror image lymphadenectomy was performed on  the left side, again at the left external iliac lymph nodes and the left obturator lymph nodes; and the obturator nerve and ureter were inspected following these maneuvers and found to be uninjured.  Attention was then directed to the bladder neck resection.  The bladder neck was identified by moving the Foley catheter back and forth, and this was carefully separated in anterior to posterior direction, trying to avoid excessive caliber of bladder neck, a small median lobe was noted as expected. This was placed on gentle superior traction allowing posterior dissection by incising approximately 7 mm ineriorly and posteriorly of the median lobe and the plane of the Dennonviler was entered and this was carefully dissected towards the area of the vas deferens which identified and dissected for distance of 4 cm ligated, and placed on gentle superior traction.  The bilateral seminal vesicles were also dissected towards the tip and was placed on gentle superior traction.  The plane of Dennonviler was further developed in base to apex orientation towards the apex of the prostate.  This plane was very adherent and required some sharp dissection which was performed very carefully.  This exposed the area of the vascular pedicles bilaterally, first on the left side. Vascular pedicles were controlled by creating columns of tissue and sequentially clipping with cold clips in the  base of the apex orientation.  This set up remained quite thick all the way towards the apex.  Thus, nerve sparing not performed whatsoever on the left side. On the right side, similar technique was used to provide excellent hemostasis to the right vascular pedicle towards the area of the apex. The tissue was much more mobile and partial nerve sparing was performed on the right.  Next, the membranous urethra was identified in the anterior plane and this was coldly transected keeping what appeared to be an adequate membranous  urethral stump, this completely free of the prostatic specimen which was placed into an EndoCatch bag for later retrieval.  Next, digital rectal exam was performed using indicator glove.  Using laparoscopic vision, no evidence of the rectal violation was seen.  Next, posterior resection was performed using a 3-0 V-Loc suture bringing the posterior urethral plate and posterior bladder neck tissue into tension-free apposition.  There was a questionable nodular area on the posterior bladder neck consistent with possible and residual prostate tissue.  Thus, this area was revised with a separate revised bladder neck margin and set aside for permanent Pathology.  Next, mucosa anastomosis was performed using double-armed V-Loc suture from the the 6 o'clock to the 12 o'clock position.  The ureteral orifices were well away from the area of anastomosis and they were positively identified.  A new Foley catheter was then placed per urethra to straight drain, which irrigated quantitatively.  Anterior reconstruction was performed by anchoring the previous anastomotic suture to the previously ligated puboprostatic ligaments.  All sponge and needle counts were correct.  Hemostasis appeared excellent.  Closed suction drain was brought through the previous left lateral most robotic port into the retroperitoneum cavity under laparoscopic vision and the right 12 mm system port was closed with fascia using a Carter-Thomason suture passer and Vicryl under laparoscopic vision.  Robot was then undocked.  Specimen was retrieved by extending the camera port site for total distance approximately 4 cm removing the prostatectomy specimen and setting it aside for permanent Pathology.  This retrieval site was closed with fascia using figure-of-eight PDS x3 followed by reapproximation of Scarpa using Vicryl.  All incision sites were infiltrated with dilute lyophilized Marcaine and closed level of skin using  subcuticular Monocryl followed by Dermabond.  Procedure was then terminated.  The patient tolerated the procedure well.  There were no immediate periprocedural complications.  The patient was taken to postanesthesia care unit in stable condition.          ______________________________ Alexis Frock, MD     TM/MEDQ  D:  07/12/2014  T:  07/12/2014  Job:  [id number]

## 2014-07-13 NOTE — Progress Notes (Signed)
1 Day Post-Op  Subjective:  1 - Moderate Risk Prostate Cancer - s/p robotic prostatectomy 07/12/2014. Path pending.  Today [name] [name] c/o abd soreness and some nausea after percocet on empty stomach. Cr and Hgb excellent. Minimal JP output.   Objective: Vital signs in last 24 hours: Temp:  [97.7 F (36.5 C)-98.8 F (37.1 C)] 98 F (36.7 C) (09/10 0637) Pulse Rate:  [59-73] 73 (09/10 0637) Resp:  [13-20] 20 (09/10 0637) BP: (123-167)/(68-83) 167/79 mmHg (09/10 0637) SpO2:  [95 %-100 %] 100 % (09/10 0637) Weight:  [88.451 kg (195 lb)] 88.451 kg (195 lb) (09/09 1054) Last BM Date: 07/12/14  Intake/Output from previous day: 09/09 0701 - 09/10 0700 In: 5710 [P.O.:960; I.V.:3750; IV Piggyback:1000] Out: 1395 [Urine:975; Drains:170; Blood:250] Intake/Output this shift:    General appearance: alert, cooperative and appears stated age Head: Normocephalic, without obvious abnormality, atraumatic Throat: lips, mucosa, and tongue normal; teeth and gums normal Neck: supple, symmetrical, trachea midline Back: symmetric, no curvature. ROM normal. No CVA tenderness. Resp: non-labored on room air. Cardio: Nl rate GI: some burping. No abd distension.  Male genitalia: normal, foley c/d/i with light pink urine, no clots.  Extremities: extremities normal, atraumatic, no cyanosis or edema Pulses: 2+ and symmetric Skin: Skin color, texture, turgor normal. No rashes or lesions Lymph nodes: Cervical, supraclavicular, and axillary nodes normal. Neurologic: Grossly normal Incision/Wound: Recent pot sites / extraction sites c/d/i. JP with serosanguinous output, scant.  Lab Results:   Recent Labs  07/12/14 1616 07/13/14 0400  HGB 15.5 14.1  HCT 43.8 40.4   BMET  Recent Labs  07/13/14 0400  NA 131*  K 4.1  CL 97  CO2 23  GLUCOSE 147*  BUN 10  CREATININE 0.89  CALCIUM 8.5   PT/INR No results found for this basename: LABPROT, INR,  in the last 72 hours ABG No results found for this  basename: PHART, PCO2, PO2, HCO3,  in the last 72 hours  Studies/Results: No results found.  Anti-infectives: Anti-infectives   Start     Dose/Rate Route Frequency Ordered Stop   07/12/14 1052  ceFAZolin (ANCEF) IVPB 2 g/50 mL premix     2 g 100 mL/hr over 30 Minutes Intravenous 30 min pre-op 07/12/14 1052 07/12/14 1247   07/12/14 0000  ciprofloxacin (CIPRO) 500 MG tablet     500 mg Oral 2 times daily 07/12/14 1614        Assessment/Plan:  1 - Moderate Risk Prostate Cancer - doing well POD 1. Zofran for nausea and encouraged to take PO pain meds with food to avoid nausea. Ambulate. Likely DC tomorrow if continuing to progress.  [facility], Faren Florence 07/13/2014

## 2015-11-19 ENCOUNTER — Ambulatory Visit
Admission: RE | Admit: 2015-11-19 | Discharge: 2015-11-19 | Disposition: A | Discharge status: Home / Self Care | Payer: Federal, State, Local not specified - PPO | Source: Ambulatory Visit | Attending: Radiation Oncology | Admitting: Radiation Oncology

## 2015-11-19 ENCOUNTER — Encounter: Payer: Self-pay | Admitting: Radiation Oncology

## 2015-11-19 VITALS — BP 162/98 | HR 62 | Resp 16 | Ht 68.0 in | Wt 199.2 lb

## 2015-11-19 DIAGNOSIS — Z9889 Other specified postprocedural states: Secondary | ICD-10-CM | POA: Diagnosis not present

## 2015-11-19 DIAGNOSIS — C61 Malignant neoplasm of prostate: Secondary | ICD-10-CM

## 2015-11-19 DIAGNOSIS — Z809 Family history of malignant neoplasm, unspecified: Secondary | ICD-10-CM | POA: Diagnosis not present

## 2015-11-19 DIAGNOSIS — Z87891 Personal history of nicotine dependence: Secondary | ICD-10-CM | POA: Diagnosis not present

## 2015-11-19 DIAGNOSIS — K219 Gastro-esophageal reflux disease without esophagitis: Secondary | ICD-10-CM | POA: Diagnosis not present

## 2015-11-19 DIAGNOSIS — Z510 Encounter for antineoplastic radiation therapy: Secondary | ICD-10-CM | POA: Insufficient documentation

## 2015-11-19 HISTORY — DX: Malignant neoplasm of prostate: C61

## 2015-11-19 NOTE — Progress Notes (Signed)
See progress note under physician encounter. 

## 2015-11-19 NOTE — Addendum Note (Signed)
Encounter addended by: Heywood Footman, RN on: 11/19/2015 12:39 PM<BR>     Documentation filed: Charges VN

## 2015-11-19 NOTE — Progress Notes (Signed)
Radiation Oncology         [phone]) [phone] ________________________________  Initial outpatient Consultation  Name: [name] [name] MRN: [id number]  Date: 11/19/2015  DOB: 1956-04-12  [id number], [name] Main, MD  Alexis Frock, MD   REFERRING PHYSICIAN: Alexis Frock, MD  DIAGNOSIS: 60 y.o. 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     ICD-9-CM ICD-10-CM   1. Malignant neoplasm of prostate ([place]) [name] [name] is a pleasant 60 y.o. gentleman with prostate cancer seen at the request of Dr. Tresa Moore. The patient was originally found to have a screening PSA of 11.17. His original DRE exam is not known. He had a biopsy in July 2015 revealing adenocarcinoma of the prostate with a Gleason score of 4+3, and 7 of the 12 biopsies were involved. His risk category was intermediate, and he ultimately elected resection of the prostate. He underwent a radical prostatectomy with LND on 07/12/14. Final pathology revealed adenocarcinoma of the prostate involving both lobes, Gleason 4+3, no LVSI or extraprostatic extension was present, and margins were negative for disease. He went on to be followed by Dr. Tresa Moore, and had an undetectable PSA until August 2016 when it was .02, it was recommended that this be followed closely to determine if this persisted. On 10/15/15, his PSA was .04. He comes today for consideration of the role of radiation to the prostatic fossa.  The patient reviewed the biopsy results with his urologist and he has kindly been referred today for discussion of potential radiation treatment options.    PREVIOUS RADIATION THERAPY: No  PAST MEDICAL HISTORY:  has a past medical history of GERD (gastroesophageal reflux disease) and Prostate cancer ([place]).    PAST SURGICAL HISTORY: Past Surgical History  Procedure Laterality Date  . Left bicep surgery      reattached  . Mucous seal      removed  from lip  . Robot assisted laparoscopic radical prostatectomy N/A 07/12/2014    Procedure: ROBOTIC ASSISTED LAPAROSCOPIC RADICAL PROSTATECTOMY , INDOCYANINE GREEN DYE INJECTION;  Surgeon: Alexis Frock, MD;  Location: [facility];  Service: Urology;  Laterality: N/A;  3.5 HRS   . Lymphadenectomy Bilateral 07/12/2014    Procedure: PELVIC LYMPH NODE DISSECTION;  Surgeon: Alexis Frock, MD;  Location: [facility];  Service: Urology;  Laterality: Bilateral;    FAMILY HISTORY: family history includes Cancer in his father and paternal grandfather; Heart disease in his mother.  SOCIAL HISTORY:  reports that he has never smoked. He quit smokeless tobacco use about 22 years ago. His smokeless tobacco use included Chew. He reports that he drinks about 1.2 oz of alcohol per week. He reports that he does not use illicit drugs.  ALLERGIES: Tetracyclines & related  MEDICATIONS:  Current Outpatient Prescriptions  Medication Sig Dispense Refill  . loratadine (CLARITIN) 10 MG tablet Take 10 mg by mouth daily.    Marland Kitchen omeprazole (PRILOSEC OTC) 20 MG tablet Take 20 mg by mouth daily.     No current facility-administered medications for this encounter.    REVIEW OF SYSTEMS:  A 15 point review of systems is documented in the electronic medical record. This was obtained by the nursing staff. However, I reviewed this with the patient to discuss relevant findings and make appropriate changes.  A comprehensive review of systems was negative.. The patient completed an IPSS and IIEF questionnaire. The patient has regained bladder control following  surgery. He indicated that his erectile function is unable  to complete sexual activity.  The patient denies weight changes, pain, nausea, and vomiting. He reports ED and stress urinary incontinence.    PHYSICAL EXAM: This patient is in no acute distress.  He is alert and oriented.   height is 5\' 8"  (1.727 m) and weight is 199 lb 3.2 oz (90.357 kg). His blood pressure is 162/98 and his  pulse is 62. His respiration is 16 and oxygen saturation is 100%.  He exhibits no respiratory distress or labored breathing.  He appears neurologically intact.  His mood is pleasant.  His affect is appropriate.    KPS = 100  100 - Normal; no complaints; no evidence of disease. 90   - Able to carry on normal activity; minor signs or symptoms of disease. 80   - Normal activity with effort; some signs or symptoms of disease. 68   - Cares for self; unable to carry on normal activity or to do active work. 60   - Requires occasional assistance, but is able to care for most of his personal needs. 50   - Requires considerable assistance and frequent medical care. 67   - Disabled; requires special care and assistance. 47   - Severely disabled; hospital admission is indicated although death not imminent. 75   - Very sick; hospital admission necessary; active supportive treatment necessary. 10   - Moribund; fatal processes progressing rapidly. 0     - Dead  Karnofsky DA, Abelmann [place], Craver LS and Burchenal [facility] [phone]) The use of the nitrogen mustards in the palliative treatment of carcinoma: with particular reference to bronchogenic carcinoma Cancer 1 634-56   LABORATORY DATA:  Lab Results  Component Value Date   WBC 5.6 07/04/2014   HGB 14.1 07/13/2014   HCT 40.4 07/13/2014   MCV 90.1 07/04/2014   PLT 213 07/04/2014   Lab Results  Component Value Date   NA 131* 07/13/2014   K 4.1 07/13/2014   CL 97 07/13/2014   CO2 23 07/13/2014   No results found for: ALT, AST, GGT, ALKPHOS, BILITOT   RADIOGRAPHY: No results found.    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  Accordingly, he is eligible for salvage radiation treatment to the prostatic fossa vs continued observation.   PLAN:   Today, I talked to the patient and family about the findings and work-up thus far.  We  discussed the natural history of biochemically recurrent prostate cancer and general treatment, highlighting the role of radiotherapy in the management.  We discussed the available radiation techniques, and focused on the details of logistics and delivery.  We reviewed the anticipated acute and late sequelae associated with radiation in this setting.  The patient was encouraged to ask questions that I answered to the best of my ability.  I filled out a patient counseling form during our discussion including treatment diagrams.  We retained a copy for our records.  The patient would like to proceed with radiation and will be scheduled for CT simulation.  I spent 60 minutes minutes face to face with the patient and more than 50% of that time was spent in counseling and/or coordination of care.    ------------------------------------------------  Sheral Apley. Tammi Klippel, M.D.  This document serves as a record of services personally performed by [name], MD. It was created on his  behalf by Jenell Milliner, a trained medical scribe. The creation of this record is based on the scribe's personal observations and the provider's statements to them. This document has been checked and approved by the attending provider.

## 2015-11-19 NOTE — Progress Notes (Signed)
GU Location of Tumor / Histology: castration resistant prostatic adenocarcinoma   If Prostate Cancer, Gleason Score is (4 + 3) and PSA is (0.04) on 10/15/2015. Pre-op PSA 11.93  [name] s/p robotic prostatectomy 07/12/2014.     Past/Anticipated interventions by urology, if any: robotic radical prostatectomy, Levitra, Trimix, pelvic PT  Past/Anticipated interventions by medical oncology, if any: no  Weight changes, if any: no  Bowel/Bladder complaints, if any: ED, Stress urinary incontinence caused by surgery has resolved. Reports nocturia x 1. IPSS 5.  Nausea/Vomiting, if any: no  Pain issues, if any:  no  SAFETY ISSUES:  Prior radiation? no  Pacemaker/ICD? no  Possible current pregnancy? no  Is the patient on methotrexate? no  Current Complaints / other details:  60 year old male. AX: Tetracycline. Works as Therapist, occupational. Married with two sons.

## 2015-11-30 ENCOUNTER — Ambulatory Visit
Admission: RE | Admit: 2015-11-30 | Discharge: 2015-11-30 | Disposition: A | Discharge status: Home / Self Care | Payer: Federal, State, Local not specified - PPO | Source: Ambulatory Visit | Attending: Radiation Oncology | Admitting: Radiation Oncology

## 2015-11-30 DIAGNOSIS — C61 Malignant neoplasm of prostate: Secondary | ICD-10-CM

## 2015-11-30 DIAGNOSIS — Z510 Encounter for antineoplastic radiation therapy: Secondary | ICD-10-CM | POA: Diagnosis not present

## 2015-11-30 NOTE — Progress Notes (Signed)
  Radiation Oncology         [phone]) [phone] ________________________________  Name: [name] [name] MRN: [id number]  Date: 11/30/2015  DOB: [birth year]  SIMULATION AND TREATMENT PLANNING NOTE    ICD-9-CM ICD-10-CM   1. Malignant neoplasm of prostate ([place]) [place]     DIAGNOSIS:  [name] [name] is a pleasant 60 y.o. 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  Identity was confirmed.  All relevant records and images related to the planned course of therapy were reviewed.  The patient freely provided informed written consent to proceed with treatment after reviewing the details related to the planned course of therapy. The consent form was witnessed and verified by the simulation staff.  Then, the patient was set-up in a stable reproducible supine position for radiation therapy.  A vacuum lock pillow device was custom fabricated to position his legs in a reproducible immobilized position.  Then, I performed a urethrogram under sterile conditions to identify the prostatic apex.  CT images were obtained.  Surface markings were placed.  The CT images were loaded into the planning software.  Then the prostate target and avoidance structures including the rectum, bladder, bowel and hips were contoured.  Treatment planning then occurred.  The radiation prescription was entered and confirmed.  A total of 1 complex treatment device was fabricated. I have requested : Intensity Modulated Radiotherapy (IMRT) is medically necessary for this case for the following reason:  Rectal sparing.Marland Kitchen  PLAN:  The patient will receive 68.4 Gy in 38 fractions.  ________________________________  Sheral Apley Tammi Klippel, M.D.  This document serves as a record of services personally performed by [name], MD. It was created on his behalf by Jenell Milliner, a trained  medical scribe. The creation of this record is based on the scribe's personal observations and the provider's statements to them. This document has been checked and approved by the attending provider.

## 2015-12-07 DIAGNOSIS — Z510 Encounter for antineoplastic radiation therapy: Secondary | ICD-10-CM | POA: Diagnosis not present

## 2015-12-10 DIAGNOSIS — Z510 Encounter for antineoplastic radiation therapy: Secondary | ICD-10-CM | POA: Diagnosis not present

## 2015-12-11 ENCOUNTER — Ambulatory Visit
Admission: RE | Admit: 2015-12-11 | Discharge: 2015-12-11 | Disposition: A | Discharge status: Home / Self Care | Payer: Federal, State, Local not specified - PPO | Source: Ambulatory Visit | Attending: Radiation Oncology | Admitting: Radiation Oncology

## 2015-12-11 DIAGNOSIS — Z510 Encounter for antineoplastic radiation therapy: Secondary | ICD-10-CM | POA: Diagnosis not present

## 2015-12-12 ENCOUNTER — Ambulatory Visit
Admission: RE | Admit: 2015-12-12 | Discharge: 2015-12-12 | Disposition: A | Discharge status: Home / Self Care | Payer: Federal, State, Local not specified - PPO | Source: Ambulatory Visit | Attending: Radiation Oncology | Admitting: Radiation Oncology

## 2015-12-12 DIAGNOSIS — Z510 Encounter for antineoplastic radiation therapy: Secondary | ICD-10-CM | POA: Diagnosis not present

## 2015-12-12 DIAGNOSIS — C61 Malignant neoplasm of prostate: Secondary | ICD-10-CM

## 2015-12-12 NOTE — Progress Notes (Signed)
[name] Dykes, RN reports that she did post sim education with the patient today.

## 2015-12-12 NOTE — Progress Notes (Signed)
Pt here for patient teaching.  Pt given Radiation and You booklet and skin care instructions. Pt reports they have not watched the Radiation Therapy Education video on December 12, 2015.  Reviewed areas of pertinence such as diarrhea, fatigue, sexual and fertility changes, skin changes and urinary and bladder changes . Pt able to give teach back of to pat skin, use unscented/gentle soap, use baby wipes, have Imodium on hand, drink plenty of water and sitz bath,avoid applying anything to skin within 4 hours of treatment( skin in treatment area-Plevis and rectal region. Pt demonstrated understanding of information given and will contact nursing with any questions or concerns.     Http://[email or web address]/treatmentinformation/whattoexpect/index - Given Video Link.

## 2015-12-13 ENCOUNTER — Encounter: Payer: Self-pay | Admitting: Radiation Oncology

## 2015-12-13 ENCOUNTER — Ambulatory Visit
Admission: RE | Admit: 2015-12-13 | Discharge: 2015-12-13 | Disposition: A | Discharge status: Home / Self Care | Payer: Federal, State, Local not specified - PPO | Source: Ambulatory Visit | Attending: Radiation Oncology | Admitting: Radiation Oncology

## 2015-12-13 VITALS — BP 170/98 | HR 85 | Temp 98.5°F | Resp 20 | Wt 198.6 lb

## 2015-12-13 DIAGNOSIS — Z510 Encounter for antineoplastic radiation therapy: Secondary | ICD-10-CM | POA: Diagnosis not present

## 2015-12-13 DIAGNOSIS — C61 Malignant neoplasm of prostate: Secondary | ICD-10-CM

## 2015-12-13 NOTE — Progress Notes (Signed)
  Radiation Oncology         [phone]   Name: [name] [name] MRN: [id number]   Date: 12/13/2015  DOB: 04-01-1956   Weekly Radiation Therapy Management    ICD-9-CM ICD-10-CM   1. Malignant neoplasm of prostate (HCC) 185 C61     Current Dose: 5.4 Gy  Planned Dose:  68.4 Gy  Narrative The patient presents for routine under treatment assessment. Weekly rad txsw prostate prostate 3/38 completed, no no hematuria, no dysuria,  Good stream, bowels regular, no pain, but high  B/p high The patient is without complaint. Set-up films were reviewed. The chart was checked.  Physical Findings  weight is 198 lb 9.6 oz (90.084 kg). His oral temperature is 98.5 F (36.9 C). His blood pressure is 170/98 and his pulse is 85. His respiration is 20. . Weight essentially stable.  No significant changes.  Impression The patient is tolerating radiation.  Plan Continue treatment as planned.  PCP to manage BP         Rodman Key A. Tammi Klippel, M.D.

## 2015-12-13 NOTE — Progress Notes (Addendum)
Weekly rad txsw prostate prostate 3/38 completed, no no hematuria, no dysuria,  Good stream, bowels regular, no pain, but high  B/p BP 158/100 mmHg  Pulse 75  Temp(Src) 98.5 F (36.9 C) (Oral)  Resp 20  Wt 198 lb 9.6 oz (90.084 kg) left arm . BP 170/104 mmHg  Pulse 85  Temp(Src) 98.5 F (36.9 C) (Oral)  Resp 20  Wt 198 lb 9.6 oz (90.084 kg) right arm Wt Readings from Last 3 Encounters:  12/13/15 198 lb 9.6 oz (90.084 kg)  11/19/15 199 lb 3.2 oz (90.357 kg)  07/12/14 195 lb (88.451 kg)

## 2015-12-14 ENCOUNTER — Ambulatory Visit
Admission: RE | Admit: 2015-12-14 | Discharge: 2015-12-14 | Disposition: A | Discharge status: Home / Self Care | Payer: Federal, State, Local not specified - PPO | Source: Ambulatory Visit | Attending: Radiation Oncology | Admitting: Radiation Oncology

## 2015-12-14 DIAGNOSIS — Z510 Encounter for antineoplastic radiation therapy: Secondary | ICD-10-CM | POA: Diagnosis not present

## 2015-12-17 ENCOUNTER — Ambulatory Visit
Admission: RE | Admit: 2015-12-17 | Discharge: 2015-12-17 | Disposition: A | Discharge status: Home / Self Care | Payer: Federal, State, Local not specified - PPO | Source: Ambulatory Visit | Attending: Radiation Oncology | Admitting: Radiation Oncology

## 2015-12-17 DIAGNOSIS — Z510 Encounter for antineoplastic radiation therapy: Secondary | ICD-10-CM | POA: Diagnosis not present

## 2015-12-18 ENCOUNTER — Ambulatory Visit
Admission: RE | Admit: 2015-12-18 | Discharge: 2015-12-18 | Disposition: A | Discharge status: Home / Self Care | Payer: Federal, State, Local not specified - PPO | Source: Ambulatory Visit | Attending: Radiation Oncology | Admitting: Radiation Oncology

## 2015-12-18 DIAGNOSIS — Z510 Encounter for antineoplastic radiation therapy: Secondary | ICD-10-CM | POA: Diagnosis not present

## 2015-12-19 ENCOUNTER — Ambulatory Visit
Admission: RE | Admit: 2015-12-19 | Discharge: 2015-12-19 | Disposition: A | Discharge status: Home / Self Care | Payer: Federal, State, Local not specified - PPO | Source: Ambulatory Visit | Attending: Radiation Oncology | Admitting: Radiation Oncology

## 2015-12-19 ENCOUNTER — Telehealth: Payer: Self-pay | Admitting: Radiation Oncology

## 2015-12-19 DIAGNOSIS — Z510 Encounter for antineoplastic radiation therapy: Secondary | ICD-10-CM | POA: Diagnosis not present

## 2015-12-19 NOTE — Telephone Encounter (Signed)
Understand from therapist on L2 patient is requesting a work noted. Phoned patient to inquire. Patient understands to pick up note tomorrow from therapist.

## 2015-12-20 ENCOUNTER — Ambulatory Visit
Admission: RE | Admit: 2015-12-20 | Discharge: 2015-12-20 | Disposition: A | Discharge status: Home / Self Care | Payer: Federal, State, Local not specified - PPO | Source: Ambulatory Visit | Attending: Radiation Oncology | Admitting: Radiation Oncology

## 2015-12-20 DIAGNOSIS — Z510 Encounter for antineoplastic radiation therapy: Secondary | ICD-10-CM | POA: Diagnosis not present

## 2015-12-21 ENCOUNTER — Ambulatory Visit
Admission: RE | Admit: 2015-12-21 | Discharge: 2015-12-21 | Disposition: A | Discharge status: Home / Self Care | Payer: Federal, State, Local not specified - PPO | Source: Ambulatory Visit | Attending: Radiation Oncology | Admitting: Radiation Oncology

## 2015-12-21 VITALS — BP 165/89 | HR 63 | Resp 16 | Wt 198.9 lb

## 2015-12-21 DIAGNOSIS — Z510 Encounter for antineoplastic radiation therapy: Secondary | ICD-10-CM | POA: Diagnosis not present

## 2015-12-21 DIAGNOSIS — C61 Malignant neoplasm of prostate: Secondary | ICD-10-CM

## 2015-12-21 NOTE — Progress Notes (Signed)
  Radiation Oncology         [phone]   Name: [name] [name] MRN: [id number]   Date: 12/21/2015  DOB: [birth year]   Weekly Radiation Therapy Management    ICD-9-CM ICD-10-CM   1. Malignant neoplasm of prostate (HCC) 185 C61     Current Dose: 16.2 Gy  Planned Dose:  68.4 Gy  Narrative The patient presents for routine under treatment assessment. Weight and vitals stable. Denies pain. Denies hematuria or dysuria. Denies incontinence or leakage. Reports a strong steady urine stream. Denies nocturia. Denies diarrhea or fatigue.  The patient is without complaint. Set-up films were reviewed. The chart was checked.  Physical Findings  weight is 198 lb 14.4 oz (90.22 kg). His blood pressure is 165/89 and his pulse is 63. His respiration is 16 and oxygen saturation is 100%. . Weight essentially stable.  No significant changes.  Impression The patient is tolerating radiation.  Plan Continue treatment as planned. Work note given.     Sheral Apley Tammi Klippel, M.D.   This document serves as a record of services personally performed by [name], MD. It was created on his behalf by Derek Mound, a trained medical scribe. The creation of this record is based on the scribe's personal observations and the provider's statements to them. This document has been checked and approved by the attending provider.

## 2015-12-21 NOTE — Progress Notes (Signed)
Weight and vitals stable. Denies pain. Denies hematuria or dysuria. Denies incontinence or leakage. Reports a strong steady urine stream. Denies nocturia. Denies diarrhea or fatigue.   BP 165/89 mmHg  Pulse 63  Resp 16  Wt 198 lb 14.4 oz (90.22 kg)  SpO2 100% Wt Readings from Last 3 Encounters:  12/21/15 198 lb 14.4 oz (90.22 kg)  12/13/15 198 lb 9.6 oz (90.084 kg)  11/19/15 199 lb 3.2 oz (90.357 kg)

## 2015-12-24 ENCOUNTER — Ambulatory Visit
Admission: RE | Admit: 2015-12-24 | Discharge: 2015-12-24 | Disposition: A | Discharge status: Home / Self Care | Payer: Federal, State, Local not specified - PPO | Source: Ambulatory Visit | Attending: Radiation Oncology | Admitting: Radiation Oncology

## 2015-12-24 DIAGNOSIS — Z510 Encounter for antineoplastic radiation therapy: Secondary | ICD-10-CM | POA: Diagnosis not present

## 2015-12-25 ENCOUNTER — Ambulatory Visit
Admission: RE | Admit: 2015-12-25 | Discharge: 2015-12-25 | Disposition: A | Discharge status: Home / Self Care | Payer: Federal, State, Local not specified - PPO | Source: Ambulatory Visit | Attending: Radiation Oncology | Admitting: Radiation Oncology

## 2015-12-25 DIAGNOSIS — Z510 Encounter for antineoplastic radiation therapy: Secondary | ICD-10-CM | POA: Diagnosis not present

## 2015-12-26 ENCOUNTER — Ambulatory Visit
Admission: RE | Admit: 2015-12-26 | Discharge: 2015-12-26 | Disposition: A | Discharge status: Home / Self Care | Payer: Federal, State, Local not specified - PPO | Source: Ambulatory Visit | Attending: Radiation Oncology | Admitting: Radiation Oncology

## 2015-12-26 DIAGNOSIS — Z510 Encounter for antineoplastic radiation therapy: Secondary | ICD-10-CM | POA: Diagnosis not present

## 2015-12-27 ENCOUNTER — Ambulatory Visit
Admission: RE | Admit: 2015-12-27 | Discharge: 2015-12-27 | Disposition: A | Discharge status: Home / Self Care | Payer: Federal, State, Local not specified - PPO | Source: Ambulatory Visit | Attending: Radiation Oncology | Admitting: Radiation Oncology

## 2015-12-27 DIAGNOSIS — Z510 Encounter for antineoplastic radiation therapy: Secondary | ICD-10-CM | POA: Diagnosis not present

## 2015-12-28 ENCOUNTER — Ambulatory Visit
Admission: RE | Admit: 2015-12-28 | Discharge: 2015-12-28 | Disposition: A | Discharge status: Home / Self Care | Payer: Federal, State, Local not specified - PPO | Source: Ambulatory Visit | Attending: Radiation Oncology | Admitting: Radiation Oncology

## 2015-12-28 ENCOUNTER — Encounter: Payer: Self-pay | Admitting: Radiation Oncology

## 2015-12-28 VITALS — BP 146/89 | HR 61 | Temp 98.2°F | Resp 16 | Wt 199.2 lb

## 2015-12-28 DIAGNOSIS — C61 Malignant neoplasm of prostate: Secondary | ICD-10-CM

## 2015-12-28 DIAGNOSIS — Z510 Encounter for antineoplastic radiation therapy: Secondary | ICD-10-CM | POA: Diagnosis not present

## 2015-12-28 NOTE — Progress Notes (Signed)
Weekly rad tx  Prostate 14/38 regular bowels daily, just started having sensation of having tio void  , stram is good, no nocturia or hematuria, appetite good, energy level good 10:17 AM BP 146/89 mmHg  Pulse 61  Temp(Src) 98.2 F (36.8 C) (Oral)  Resp 16  Wt 199 lb 3.2 oz (90.357 kg)  Wt Readings from Last 3 Encounters:  12/28/15 199 lb 3.2 oz (90.357 kg)  12/21/15 198 lb 14.4 oz (90.22 kg)  12/13/15 198 lb 9.6 oz (90.084 kg)

## 2015-12-28 NOTE — Progress Notes (Signed)
  Radiation Oncology         [phone]   Name: [name] [name] MRN: [id number]   Date: 12/28/2015  DOB: 1956-07-22     Weekly Radiation Therapy Management    ICD-9-CM ICD-10-CM   1. Malignant neoplasm of prostate (HCC) 185 C61     Current Dose: 25.2 Gy  Planned Dose:  68.4 Gy  Narrative The patient presents for routine under treatment assessment.  Weekly radiation treatment to Prostate 14/38. Regular bowels daily. Just started having sensation of having to void.  Stream is good. No nocturia or hematuria. Appetite and energy level are good.  The patient is without complaint. Set-up films were reviewed. The chart was checked.  Physical Findings  weight is 199 lb 3.2 oz (90.357 kg). His oral temperature is 98.2 F (36.8 C). His blood pressure is 146/89 and his pulse is 61. His respiration is 16. . Weight essentially stable.  No significant changes.  Impression The patient is tolerating radiation.  Plan Continue treatment as planned.      Sheral Apley Tammi Klippel, M.D.   This document serves as a record of services personally performed by [name], MD. It was created on his behalf by Arlyce Harman, a trained medical scribe. The creation of this record is based on the scribe's personal observations and the provider's statements to them. This document has been checked and approved by the attending provider.

## 2015-12-31 ENCOUNTER — Ambulatory Visit
Admission: RE | Admit: 2015-12-31 | Discharge: 2015-12-31 | Disposition: A | Discharge status: Home / Self Care | Payer: Federal, State, Local not specified - PPO | Source: Ambulatory Visit | Attending: Radiation Oncology | Admitting: Radiation Oncology

## 2015-12-31 DIAGNOSIS — Z510 Encounter for antineoplastic radiation therapy: Secondary | ICD-10-CM | POA: Diagnosis not present

## 2016-01-01 ENCOUNTER — Ambulatory Visit
Admission: RE | Admit: 2016-01-01 | Discharge: 2016-01-01 | Disposition: A | Discharge status: Home / Self Care | Payer: Federal, State, Local not specified - PPO | Source: Ambulatory Visit | Attending: Radiation Oncology | Admitting: Radiation Oncology

## 2016-01-01 DIAGNOSIS — Z510 Encounter for antineoplastic radiation therapy: Secondary | ICD-10-CM | POA: Diagnosis not present

## 2016-01-02 ENCOUNTER — Ambulatory Visit
Admission: RE | Admit: 2016-01-02 | Discharge: 2016-01-02 | Disposition: A | Discharge status: Home / Self Care | Payer: Federal, State, Local not specified - PPO | Source: Ambulatory Visit | Attending: Radiation Oncology | Admitting: Radiation Oncology

## 2016-01-02 DIAGNOSIS — Z510 Encounter for antineoplastic radiation therapy: Secondary | ICD-10-CM | POA: Diagnosis not present

## 2016-01-03 ENCOUNTER — Ambulatory Visit
Admission: RE | Admit: 2016-01-03 | Discharge: 2016-01-03 | Disposition: A | Discharge status: Home / Self Care | Payer: Federal, State, Local not specified - PPO | Source: Ambulatory Visit | Attending: Radiation Oncology | Admitting: Radiation Oncology

## 2016-01-03 VITALS — BP 157/90 | HR 62 | Resp 16 | Wt 199.4 lb

## 2016-01-03 DIAGNOSIS — C61 Malignant neoplasm of prostate: Secondary | ICD-10-CM

## 2016-01-03 DIAGNOSIS — Z510 Encounter for antineoplastic radiation therapy: Secondary | ICD-10-CM | POA: Diagnosis not present

## 2016-01-03 NOTE — Progress Notes (Signed)
  Radiation Oncology         [phone]   Name: [name] [name] MRN: [id number]   Date: 01/03/2016  DOB: 1956/09/24     Weekly Radiation Therapy Management    ICD-9-CM ICD-10-CM   1. Malignant neoplasm of prostate (HCC) 185 C61     Current Dose: 32.4 Gy  Planned Dose:  68.4 Gy  Narrative The patient presents for routine under treatment assessment.  Weight and vitals stable. Denies pain, hematuria, dysuria, incontinence, leakage, nocturia, or diarrhea. Reports a strong steady urine stream. Reports mild fatigue.   The patient is without complaint. Set-up films were reviewed. The chart was checked.  Physical Findings  weight is 199 lb 6.4 oz (90.447 kg). His blood pressure is 157/90 and his pulse is 62. His respiration is 16 and oxygen saturation is 100%. . Weight essentially stable.  No significant changes.  Impression The patient is tolerating radiation.  Plan Continue treatment as planned.      Sheral Apley Tammi Klippel, M.D.  This document serves as a record of services personally performed by [name], MD. It was created on his behalf by Darcus Austin, a trained medical scribe. The creation of this record is based on the scribe's personal observations and the provider's statements to them. This document has been checked and approved by the attending provider.

## 2016-01-03 NOTE — Progress Notes (Signed)
Weight and vitals stable. Denies pain. Denies hematuria or dysuria. Denies incontinence or leakage. Reports a strong steady urine stream. Denies nocturia. Denies diarrhea. Reports mild fatigue.   BP 157/90 mmHg  Pulse 62  Resp 16  Wt 199 lb 6.4 oz (90.447 kg)  SpO2 100% Wt Readings from Last 3 Encounters:  01/03/16 199 lb 6.4 oz (90.447 kg)  12/28/15 199 lb 3.2 oz (90.357 kg)  12/21/15 198 lb 14.4 oz (90.22 kg)

## 2016-01-04 ENCOUNTER — Ambulatory Visit
Admission: RE | Admit: 2016-01-04 | Discharge: 2016-01-04 | Disposition: A | Discharge status: Home / Self Care | Payer: Federal, State, Local not specified - PPO | Source: Ambulatory Visit | Attending: Radiation Oncology | Admitting: Radiation Oncology

## 2016-01-04 DIAGNOSIS — Z510 Encounter for antineoplastic radiation therapy: Secondary | ICD-10-CM | POA: Diagnosis not present

## 2016-01-07 ENCOUNTER — Ambulatory Visit
Admission: RE | Admit: 2016-01-07 | Discharge: 2016-01-07 | Disposition: A | Discharge status: Home / Self Care | Payer: Federal, State, Local not specified - PPO | Source: Ambulatory Visit | Attending: Radiation Oncology | Admitting: Radiation Oncology

## 2016-01-07 DIAGNOSIS — Z510 Encounter for antineoplastic radiation therapy: Secondary | ICD-10-CM | POA: Diagnosis not present

## 2016-01-08 ENCOUNTER — Ambulatory Visit
Admission: RE | Admit: 2016-01-08 | Discharge: 2016-01-08 | Disposition: A | Discharge status: Home / Self Care | Payer: Federal, State, Local not specified - PPO | Source: Ambulatory Visit | Attending: Radiation Oncology | Admitting: Radiation Oncology

## 2016-01-08 DIAGNOSIS — Z510 Encounter for antineoplastic radiation therapy: Secondary | ICD-10-CM | POA: Diagnosis not present

## 2016-01-09 ENCOUNTER — Ambulatory Visit
Admission: RE | Admit: 2016-01-09 | Discharge: 2016-01-09 | Disposition: A | Discharge status: Home / Self Care | Payer: Federal, State, Local not specified - PPO | Source: Ambulatory Visit | Attending: Radiation Oncology | Admitting: Radiation Oncology

## 2016-01-09 DIAGNOSIS — Z510 Encounter for antineoplastic radiation therapy: Secondary | ICD-10-CM | POA: Diagnosis not present

## 2016-01-10 ENCOUNTER — Encounter: Payer: Self-pay | Admitting: Radiation Oncology

## 2016-01-10 ENCOUNTER — Ambulatory Visit
Admission: RE | Admit: 2016-01-10 | Discharge: 2016-01-10 | Disposition: A | Discharge status: Home / Self Care | Payer: Federal, State, Local not specified - PPO | Source: Ambulatory Visit | Attending: Radiation Oncology | Admitting: Radiation Oncology

## 2016-01-10 VITALS — BP 162/89 | HR 62 | Resp 16 | Wt 197.3 lb

## 2016-01-10 DIAGNOSIS — Z510 Encounter for antineoplastic radiation therapy: Secondary | ICD-10-CM | POA: Diagnosis not present

## 2016-01-10 DIAGNOSIS — C61 Malignant neoplasm of prostate: Secondary | ICD-10-CM

## 2016-01-10 NOTE — Progress Notes (Signed)
  Radiation Oncology         [phone]   Name: [name] [name] MRN: [id number]   Date: 01/10/2016  DOB: 29-Jul-1956     Weekly Radiation Therapy Management    ICD-9-CM ICD-10-CM   1. Malignant neoplasm of prostate (HCC) 185 C61     Current Dose: 41.4 Gy  Planned Dose:  68.4 Gy  Narrative The patient presents for routine under treatment assessment.  Weight and vitals stable. Denies pain. Denies hematuria or dysuria. Denies incontinence or leakage. Reports a strong steady urine stream. Denies nocturia. Denies diarrhea. Reports mild fatigue.  The patient is without complaint. Set-up films were reviewed. The chart was checked.  Physical Findings  weight is 197 lb 4.8 oz (89.495 kg). His blood pressure is 162/89 and his pulse is 62. His respiration is 16. . Weight essentially stable.  No significant changes.  Impression The patient is tolerating radiation.  Plan Continue treatment as planned.      Sheral Apley Tammi Klippel, M.D.  This document serves as a record of services personally performed by [name], MD. It was created on his behalf by Arlyce Harman, a trained medical scribe. The creation of this record is based on the scribe's personal observations and the provider's statements to them. This document has been checked and approved by the attending provider.

## 2016-01-10 NOTE — Progress Notes (Signed)
Weight and vitals stable. Denies pain. Denies hematuria or dysuria. Denies incontinence or leakage. Reports a strong steady urine stream. Denies nocturia. Denies diarrhea. Reports mild fatigue.   BP 162/89 mmHg  Pulse 62  Resp 16  Wt 197 lb 4.8 oz (89.495 kg) Wt Readings from Last 3 Encounters:  01/10/16 197 lb 4.8 oz (89.495 kg)  01/03/16 199 lb 6.4 oz (90.447 kg)  12/28/15 199 lb 3.2 oz (90.357 kg)

## 2016-01-11 ENCOUNTER — Ambulatory Visit
Admission: RE | Admit: 2016-01-11 | Discharge: 2016-01-11 | Disposition: A | Discharge status: Home / Self Care | Payer: Federal, State, Local not specified - PPO | Source: Ambulatory Visit | Attending: Radiation Oncology | Admitting: Radiation Oncology

## 2016-01-11 DIAGNOSIS — Z510 Encounter for antineoplastic radiation therapy: Secondary | ICD-10-CM | POA: Diagnosis not present

## 2016-01-14 ENCOUNTER — Ambulatory Visit
Admission: RE | Admit: 2016-01-14 | Discharge: 2016-01-14 | Disposition: A | Discharge status: Home / Self Care | Payer: Federal, State, Local not specified - PPO | Source: Ambulatory Visit | Attending: Radiation Oncology | Admitting: Radiation Oncology

## 2016-01-14 DIAGNOSIS — Z510 Encounter for antineoplastic radiation therapy: Secondary | ICD-10-CM | POA: Diagnosis not present

## 2016-01-15 ENCOUNTER — Ambulatory Visit
Admission: RE | Admit: 2016-01-15 | Discharge: 2016-01-15 | Disposition: A | Discharge status: Home / Self Care | Payer: Federal, State, Local not specified - PPO | Source: Ambulatory Visit | Attending: Radiation Oncology | Admitting: Radiation Oncology

## 2016-01-15 DIAGNOSIS — Z510 Encounter for antineoplastic radiation therapy: Secondary | ICD-10-CM | POA: Diagnosis not present

## 2016-01-16 ENCOUNTER — Ambulatory Visit
Admission: RE | Admit: 2016-01-16 | Discharge: 2016-01-16 | Disposition: A | Discharge status: Home / Self Care | Payer: Federal, State, Local not specified - PPO | Source: Ambulatory Visit | Attending: Radiation Oncology | Admitting: Radiation Oncology

## 2016-01-16 DIAGNOSIS — Z510 Encounter for antineoplastic radiation therapy: Secondary | ICD-10-CM | POA: Diagnosis not present

## 2016-01-17 ENCOUNTER — Ambulatory Visit
Admission: RE | Admit: 2016-01-17 | Discharge: 2016-01-17 | Disposition: A | Discharge status: Home / Self Care | Payer: Federal, State, Local not specified - PPO | Source: Ambulatory Visit | Attending: Radiation Oncology | Admitting: Radiation Oncology

## 2016-01-17 DIAGNOSIS — Z510 Encounter for antineoplastic radiation therapy: Secondary | ICD-10-CM | POA: Diagnosis not present

## 2016-01-18 ENCOUNTER — Ambulatory Visit
Admission: RE | Admit: 2016-01-18 | Discharge: 2016-01-18 | Disposition: A | Discharge status: Home / Self Care | Payer: Federal, State, Local not specified - PPO | Source: Ambulatory Visit | Attending: Radiation Oncology | Admitting: Radiation Oncology

## 2016-01-18 ENCOUNTER — Encounter: Payer: Self-pay | Admitting: Radiation Oncology

## 2016-01-18 VITALS — BP 159/87 | HR 69 | Resp 16 | Wt 195.9 lb

## 2016-01-18 DIAGNOSIS — Z510 Encounter for antineoplastic radiation therapy: Secondary | ICD-10-CM | POA: Diagnosis not present

## 2016-01-18 DIAGNOSIS — C61 Malignant neoplasm of prostate: Secondary | ICD-10-CM

## 2016-01-18 NOTE — Progress Notes (Signed)
Weight and vitals stable. Denies pain. Denies hematuria or dysuria. Denies incontinence or leakage. Reports a strong steady urine stream. Denies nocturia. Denies diarrhea. Reports mild fatigue.   BP 159/87 mmHg  Pulse 69  Resp 16  Wt 195 lb 14.4 oz (88.86 kg)  SpO2 100%  Wt Readings from Last 3 Encounters:  01/18/16 195 lb 14.4 oz (88.86 kg)  01/10/16 197 lb 4.8 oz (89.495 kg)  01/03/16 199 lb 6.4 oz (90.447 kg)

## 2016-01-18 NOTE — Progress Notes (Signed)
  Radiation Oncology         [phone]   Name: [name] [name] MRN: [id number]   Date: 01/18/2016  DOB: [birth year]     Weekly Radiation Therapy Management    ICD-9-CM ICD-10-CM   1. Malignant neoplasm of prostate (HCC) 185 C61     Current Dose: 52.2 Gy  Planned Dose:  68.4 Gy  Narrative The patient presents for routine under treatment assessment.  Weight and vitals stable. Denies pain. Denies hematuria or dysuria. Denies incontinence or leakage. Reports a strong steady urine stream. Denies nocturia. Denies diarrhea. Reports mild fatigue.  The patient is without complaint. Set-up films were reviewed. The chart was checked.  Physical Findings  weight is 195 lb 14.4 oz (88.86 kg). His blood pressure is 159/87 and his pulse is 69. His respiration is 16 and oxygen saturation is 100%. . Weight essentially stable.  No significant changes.  Impression The patient is tolerating radiation.  Plan Continue treatment as planned. I will follow up with him 01/28/2016.     Sheral Apley Tammi Klippel, M.D.    This document serves as a record of services personally performed by [name], MD. It was created on his behalf by Lendon Collar, a trained medical scribe. The creation of this record is based on the scribe's personal observations and the provider's statements to them. This document has been checked and approved by the attending provider.

## 2016-01-21 ENCOUNTER — Ambulatory Visit
Admission: RE | Admit: 2016-01-21 | Discharge: 2016-01-21 | Disposition: A | Discharge status: Home / Self Care | Payer: Federal, State, Local not specified - PPO | Source: Ambulatory Visit | Attending: Radiation Oncology | Admitting: Radiation Oncology

## 2016-01-21 DIAGNOSIS — Z510 Encounter for antineoplastic radiation therapy: Secondary | ICD-10-CM | POA: Diagnosis not present

## 2016-01-22 ENCOUNTER — Ambulatory Visit
Admission: RE | Admit: 2016-01-22 | Discharge: 2016-01-22 | Disposition: A | Discharge status: Home / Self Care | Payer: Federal, State, Local not specified - PPO | Source: Ambulatory Visit | Attending: Radiation Oncology | Admitting: Radiation Oncology

## 2016-01-22 DIAGNOSIS — Z510 Encounter for antineoplastic radiation therapy: Secondary | ICD-10-CM | POA: Diagnosis not present

## 2016-01-23 ENCOUNTER — Ambulatory Visit
Admission: RE | Admit: 2016-01-23 | Discharge: 2016-01-23 | Disposition: A | Discharge status: Home / Self Care | Payer: Federal, State, Local not specified - PPO | Source: Ambulatory Visit | Attending: Radiation Oncology | Admitting: Radiation Oncology

## 2016-01-23 DIAGNOSIS — Z510 Encounter for antineoplastic radiation therapy: Secondary | ICD-10-CM | POA: Diagnosis not present

## 2016-01-24 ENCOUNTER — Ambulatory Visit
Admission: RE | Admit: 2016-01-24 | Discharge: 2016-01-24 | Disposition: A | Discharge status: Home / Self Care | Payer: Federal, State, Local not specified - PPO | Source: Ambulatory Visit | Attending: Radiation Oncology | Admitting: Radiation Oncology

## 2016-01-24 DIAGNOSIS — Z510 Encounter for antineoplastic radiation therapy: Secondary | ICD-10-CM | POA: Diagnosis not present

## 2016-01-25 ENCOUNTER — Encounter: Payer: Self-pay | Admitting: Radiation Oncology

## 2016-01-25 ENCOUNTER — Ambulatory Visit
Admission: RE | Admit: 2016-01-25 | Discharge: 2016-01-25 | Disposition: A | Discharge status: Home / Self Care | Payer: Federal, State, Local not specified - PPO | Source: Ambulatory Visit | Attending: Radiation Oncology | Admitting: Radiation Oncology

## 2016-01-25 VITALS — BP 164/89 | HR 70 | Temp 98.3°F | Ht 68.0 in | Wt 201.2 lb

## 2016-01-25 DIAGNOSIS — Z510 Encounter for antineoplastic radiation therapy: Secondary | ICD-10-CM | POA: Diagnosis not present

## 2016-01-25 DIAGNOSIS — C61 Malignant neoplasm of prostate: Secondary | ICD-10-CM

## 2016-01-25 NOTE — Progress Notes (Signed)
  Radiation Oncology         [phone]   Name: [name] [name] MRN: [id number]   Date: 01/25/2016  DOB: [birth year]     Weekly Radiation Therapy Management    ICD-9-CM ICD-10-CM   1. Malignant neoplasm of prostate (HCC) 185 C61     Current Dose: 61.2 Gy  Planned Dose:  68.4 Gy  Narrative The patient presents for routine under treatment assessment.  Denies new issues. He denies pain, and reports mild fatigue. He is drinking cranberry juice to help lower his blood pressure. He denies any urinary problems. He does report some diarrhea, which he reports has been occuring since his prostate was removed. Attributes diarrhea to adding more fruit to his diet. He does report some increase gas over the past couple of days.   The patient is without complaint. Set-up films were reviewed. The chart was checked.  Physical Findings  height is 5\' 8"  (1.727 m) and weight is 201 lb 3.2 oz (91.264 kg). His temperature is 98.3 F (36.8 C). His blood pressure is 164/89 and his pulse is 70. His oxygen saturation is 99%. Marland Kitchen NAD - well appearing  Impression The patient is tolerating radiation.  Plan Continue treatment as planned.     -----------------------------------  Eppie Gibson, MD    This document serves as a record of services personally performed by Eppie Gibson, MD. It was created on her behalf by Arlyce Harman, a trained medical scribe. The creation of this record is based on the scribe's personal observations and the provider's statements to them. This document has been checked and approved by the attending provider.

## 2016-01-25 NOTE — Progress Notes (Signed)
[name] [name] presents for his 34th fraction of radiation to his Prostate Fossa. He denies pain, and reports mild fatigue. He is drinking cranberry juice to help lower his blood pressure. He denies any urinary problems. He does report some diarrhea, which he reports has been occuring since his prostate was removed. He does report some increase gas over the past couple of days.  BP 164/89 mmHg  Pulse 70  Temp(Src) 98.3 F (36.8 C)  Ht 5\' 8"  (1.727 m)  Wt 201 lb 3.2 oz (91.264 kg)  BMI 30.60 kg/m2  SpO2 99%   Wt Readings from Last 3 Encounters:  01/25/16 201 lb 3.2 oz (91.264 kg)  01/18/16 195 lb 14.4 oz (88.86 kg)  01/10/16 197 lb 4.8 oz (89.495 kg)

## 2016-01-28 ENCOUNTER — Encounter: Payer: Self-pay | Admitting: Radiation Oncology

## 2016-01-28 ENCOUNTER — Ambulatory Visit
Admission: RE | Admit: 2016-01-28 | Discharge: 2016-01-28 | Disposition: A | Discharge status: Home / Self Care | Payer: Federal, State, Local not specified - PPO | Source: Ambulatory Visit | Attending: Radiation Oncology | Admitting: Radiation Oncology

## 2016-01-28 DIAGNOSIS — Z510 Encounter for antineoplastic radiation therapy: Secondary | ICD-10-CM | POA: Diagnosis not present

## 2016-01-29 ENCOUNTER — Ambulatory Visit
Admission: RE | Admit: 2016-01-29 | Discharge: 2016-01-29 | Disposition: A | Discharge status: Home / Self Care | Payer: Federal, State, Local not specified - PPO | Source: Ambulatory Visit | Attending: Radiation Oncology | Admitting: Radiation Oncology

## 2016-01-29 DIAGNOSIS — Z510 Encounter for antineoplastic radiation therapy: Secondary | ICD-10-CM | POA: Diagnosis not present

## 2016-01-30 ENCOUNTER — Ambulatory Visit
Admission: RE | Admit: 2016-01-30 | Discharge: 2016-01-30 | Disposition: A | Discharge status: Home / Self Care | Payer: Federal, State, Local not specified - PPO | Source: Ambulatory Visit | Attending: Radiation Oncology | Admitting: Radiation Oncology

## 2016-01-30 DIAGNOSIS — Z510 Encounter for antineoplastic radiation therapy: Secondary | ICD-10-CM | POA: Diagnosis not present

## 2016-01-31 ENCOUNTER — Encounter: Payer: Self-pay | Admitting: Radiation Oncology

## 2016-01-31 ENCOUNTER — Ambulatory Visit
Admission: RE | Admit: 2016-01-31 | Discharge: 2016-01-31 | Disposition: A | Discharge status: Home / Self Care | Payer: Federal, State, Local not specified - PPO | Source: Ambulatory Visit | Attending: Radiation Oncology | Admitting: Radiation Oncology

## 2016-01-31 VITALS — BP 180/93 | HR 67 | Resp 16 | Wt 200.1 lb

## 2016-01-31 DIAGNOSIS — C61 Malignant neoplasm of prostate: Secondary | ICD-10-CM

## 2016-01-31 DIAGNOSIS — Z510 Encounter for antineoplastic radiation therapy: Secondary | ICD-10-CM | POA: Diagnosis not present

## 2016-01-31 NOTE — Progress Notes (Signed)
  Radiation Oncology         [phone]   Name: [name] [name] MRN: [id number]   Date: 01/31/2016  DOB: 12-07-1955     Weekly Radiation Therapy Management    ICD-9-CM ICD-10-CM   1. Malignant neoplasm of prostate (HCC) 185 C61     Current Dose: 68.4 Gy  Planned Dose:  68.4 Gy  Narrative The patient presents for routine under treatment assessment.  Weight stable. BP elevated. Continues to drink cranberry juice in an attempt to lower his bp. Denies pain. Denies hematuria. Reports mild dysuria. Denies incontinence or leakage. Reports a strong steady urine stream. Denies nocturia. Reports some diarrhea and increased gas over the last several days. Reports mild fatigue. One month follow up appointment card given. Patient understands to contact this RN with future needs.  The patient is without complaint. Set-up films were reviewed. The chart was checked.  Physical Findings  weight is 200 lb 1.6 oz (90.765 kg). His blood pressure is 180/93 and his pulse is 67. His respiration is 16 and oxygen saturation is 100%. . Weight essentially stable.  No significant changes.  Impression The patient is tolerating radiation.  Plan He has completed treatment today. He has a one month follow up appointment 03/06/2016.     Sheral Apley Tammi Klippel, M.D.    This document serves as a record of services personally performed by [name], MD. It was created on his behalf by Lendon Collar, a trained medical scribe. The creation of this record is based on the scribe's personal observations and the provider's statements to them. This document has been checked and approved by the attending provider.

## 2016-01-31 NOTE — Progress Notes (Addendum)
Weight stable. BP elevated. Continues to drink cranberry juice in an attempt to lower his bp. Denies pain. Denies hematuria. Reports mild dysuria. Denies incontinence or leakage. Reports a strong steady urine stream. Denies nocturia. Reports some diarrhea and increased gas over the last several days. Reports mild fatigue. One month follow up appointment card given. Patient understands to contact this RN with future needs.  BP 180/93 mmHg  Pulse 67  Resp 16  Wt 200 lb 1.6 oz (90.765 kg)  SpO2 100% Wt Readings from Last 3 Encounters:  01/31/16 200 lb 1.6 oz (90.765 kg)  01/25/16 201 lb 3.2 oz (91.264 kg)  01/18/16 195 lb 14.4 oz (88.86 kg)

## 2016-02-22 NOTE — Progress Notes (Signed)
  Radiation Oncology         [phone]) [phone] ________________________________  Name: [name] [name] MRN: [id number]  Date: 01/31/2016  DOB: Oct 26, 1956  End of Treatment Note   ICD-9-CM ICD-10-CM    1. Malignant neoplasm of prostate [facility]) [place]     DIAGNOSIS: [name] is a pleasant 60 y.o. 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 Image guidance was performed with megavoltage CT studies prior to each fraction. He was immobilized with a body fix lower extremity mold.  Narrative: The patient tolerated radiation treatment relatively well.  The patient experienced some mild urinary symptoms, diarrhea, and modest fatigue.   Plan: The patient has completed radiation treatment. He will return to radiation oncology clinic for routine followup in one month. I advised him to call or return sooner if he has any questions or concerns related to his recovery or treatment. ________________________________  Sheral Apley. Tammi Klippel, M.D.  This document serves as a record of services personally performed by [name], MD. It was created on his behalf by Arlyce Harman, a trained medical scribe. The creation of this record is based on the scribe's personal observations and the provider's statements to them. This document has been checked and approved by the attending provider.

## 2016-03-06 ENCOUNTER — Ambulatory Visit
Admission: RE | Admit: 2016-03-06 | Discharge: 2016-03-06 | Disposition: A | Discharge status: Home / Self Care | Payer: Federal, State, Local not specified - PPO | Source: Ambulatory Visit | Attending: Radiation Oncology | Admitting: Radiation Oncology

## 2016-03-06 ENCOUNTER — Encounter: Payer: Self-pay | Admitting: Radiation Oncology

## 2016-03-06 VITALS — BP 158/93 | HR 78 | Temp 98.0°F | Resp 18 | Ht 68.0 in | Wt 198.0 lb

## 2016-03-06 DIAGNOSIS — C61 Malignant neoplasm of prostate: Secondary | ICD-10-CM

## 2016-03-06 NOTE — Progress Notes (Signed)
[name] [name] here today for follow up for prostate cancer.  Appetite is good.  Denies fatigue and pain today. no bladder or bowel changes. BP 158/93 mmHg  Pulse 78  Temp(Src) 98 F (36.7 C) (Oral)  Resp 18  Ht 5\' 8"  (1.727 m)  Wt 198 lb (89.812 kg)  BMI 30.11 kg/m2  SpO2 96%

## 2016-03-06 NOTE — Progress Notes (Signed)
Radiation Oncology         [phone]) [phone] ________________________________  Name: [name] [name] MRN: [id number]  Date: 03/06/2016  DOB: 11-30-1955  Follow-Up Visit Note  CC: Orpah Melter, MD  Alexis Frock, MD  Diagnosis:   Stage T2c adenocarcinoma of the prostate with a Gleason's score of 4+3 and a post-prostatectomy rising PSA of 0.05.    ICD-9-CM ICD-10-CM   1. Malignant neoplasm of prostate (HCC) 185 C61     Interval Since Last Radiation: 1 month  Narrative:  The patient returns today for routine follow-up.  He completed his radiotherapy to the prostatic fossa about one month ago. He has follow up with Dr. Tresa Moore in two weeks.   On review of systems, he states he is doing well overall. He denies any urinary dysfunction or bowel concerns. He denies any fatigue, nausea, vomiting, chest pain, shortness of breath, fevers, unintended weight changes, or new joint/bone pains. No other complaints are noted.                              ALLERGIES:  is allergic to tetracyclines & related.  Meds: Current Outpatient Prescriptions  Medication Sig Dispense Refill  . loratadine (CLARITIN) 10 MG tablet Take 10 mg by mouth daily.    Marland Kitchen omeprazole (PRILOSEC OTC) 20 MG tablet Take 20 mg by mouth daily.     No current facility-administered medications for this encounter.    Physical Findings:  height is 5\' 8"  (1.727 m) and weight is 198 lb (89.812 kg). His oral temperature is 98 F (36.7 C). His blood pressure is 158/93 and his pulse is 78. His respiration is 18 and oxygen saturation is 96%.   Pain scale 0/10 In general this is a well appearing caucasian male in no acute distress. He's alert and oriented x4 and appropriate throughout the examination. Cardiopulmonary assessment is negative for acute distress and he exhibits normal effort.    Lab Findings: Lab Results  Component Value Date   WBC 5.6 07/04/2014   HGB 14.1 07/13/2014   HCT 40.4 07/13/2014   PLT 213 07/04/2014    Lab  Results  Component Value Date   NA 131* 07/13/2014   K 4.1 07/13/2014   CO2 23 07/13/2014   GLUCOSE 147* 07/13/2014   BUN 10 07/13/2014   CREATININE 0.89 07/13/2014   CALCIUM 8.5 07/13/2014   ANIONGAP 11 07/13/2014    Radiographic Findings: No results found.  Impression/Plan: 1. History of prostate cancer with rising PSA following prostatectomy. The patient has completed radiotherapy and is doing very well. His treatment related side effects appear to have resolved. We discussed the rationale for close follow up with Dr. Tresa Moore. He did have a PSA which was .09 about one week ago. The patient will continue to follow up with Dr. Tresa Moore and will review this as well at his appointment in two weeks. At this point however, we feel that the change in PSA is likely due to his recent radiotherapy, and that following this over time will indicate the trend of his response to treatment. He states agreement and understanding of this. We will be happy to see him again in the future, however will not schedule any follow up as he will continue with Dr. Tresa Moore. He will call if he has questions regarding his radiotherapy. 2. Survivorship. The patient is not interested in meeting for a face to face encounter. He is however interested in obtaining his  survivorship care plan by mail. I will include our Survivorship coordinator, Mike Craze, NP in this request.     Carola Rhine, PAC

## 2016-12-24 ENCOUNTER — Other Ambulatory Visit: Payer: Self-pay | Admitting: Urology

## 2016-12-24 DIAGNOSIS — C61 Malignant neoplasm of prostate: Secondary | ICD-10-CM

## 2017-01-08 ENCOUNTER — Encounter (HOSPITAL_COMMUNITY)
Admission: RE | Admit: 2017-01-08 | Discharge: 2017-01-08 | Disposition: A | Discharge status: Home / Self Care | Payer: Federal, State, Local not specified - PPO | Source: Ambulatory Visit | Attending: Urology | Admitting: Urology

## 2017-01-08 DIAGNOSIS — C61 Malignant neoplasm of prostate: Secondary | ICD-10-CM | POA: Diagnosis present

## 2018-08-29 IMAGING — CT NM PET NOPR SKULL BASE TO THIGH
7 series · 25 of 25 positions shown · non-contrast
Comparison: None.

CLINICAL DATA: Prostate carcinoma with biochemical recurrence. PSA
equal 0.62. Prostatectomy 5073 (Gleason 7)

EXAM:
NUCLEAR MEDICINE PET SKULL BASE TO THIGH
TECHNIQUE: mCi F-18 Fluciclovine was injected intravenously. Full-ring PET
imaging was performed from the skull base to thigh after the
radiotracer. CT data was obtained and used for attenuation
correction and anatomic localization.

[Series 3: pet sk_thigh ac · axial · 5.0mm · 4.07mm/px · z∈[+452,+1380]mm · 5 of 233 slices shown]
[im 1/233]
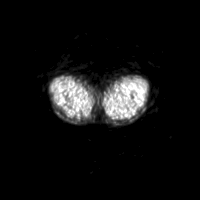
[im 59/233]
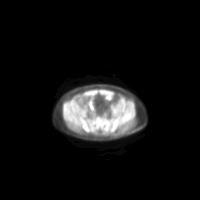
[im 117/233]
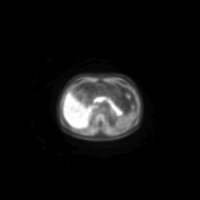
[im 175/233]
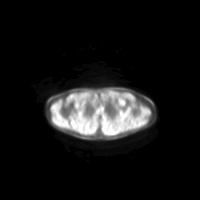
[im 233/233]
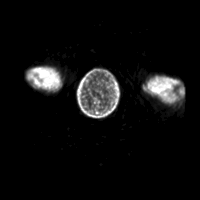

[Series 4: ct sk_thigh 5.0 b31f · axial · 5.0mm · 0.98mm/px · z∈[+452,+1380]mm · 5 of 233 slices shown]
[im 1/233]
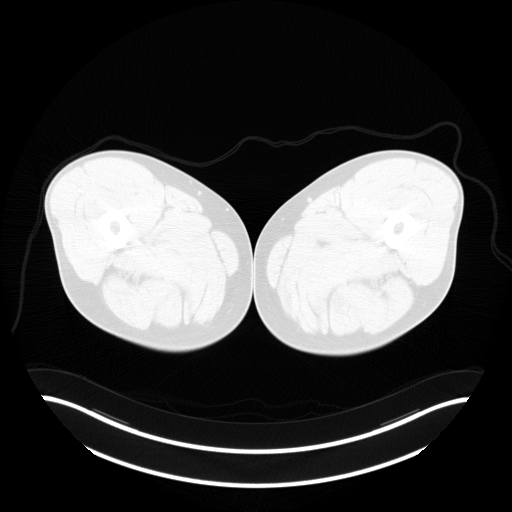
[im 59/233]
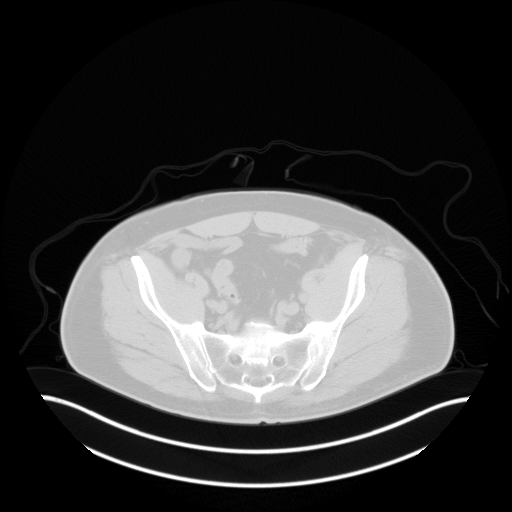
[im 117/233]
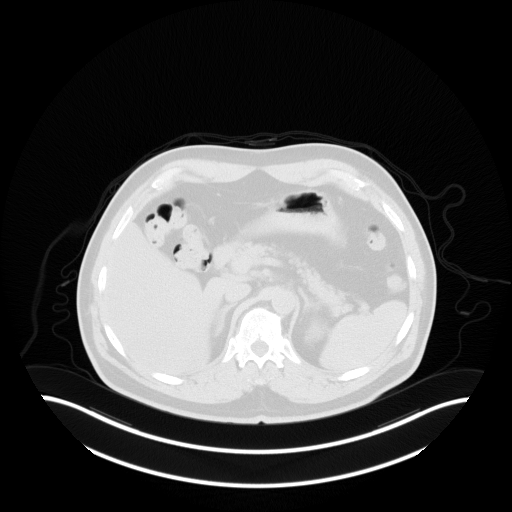
[im 175/233]
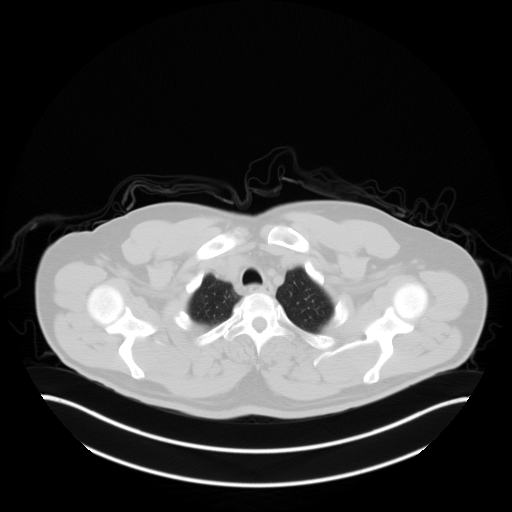
[im 233/233  brain]
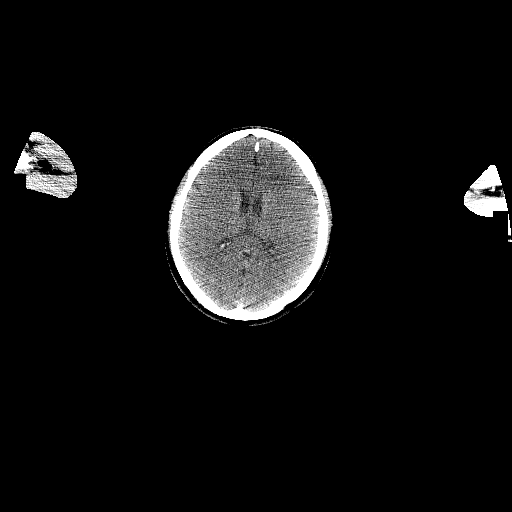

[Series 7: pet sk_thigh nac · axial · 5.0mm · 4.07mm/px · z∈[+452,+1380]mm · 6 of 233 slices shown]
[im 1/233]
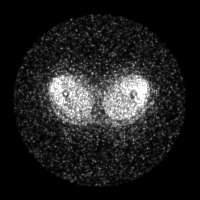
[im 47/233]
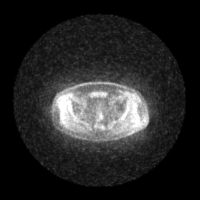
[im 93/233]
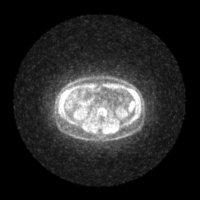
[im 140/233]
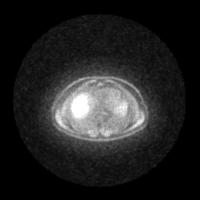
[im 186/233]
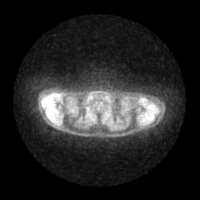
[im 233/233]
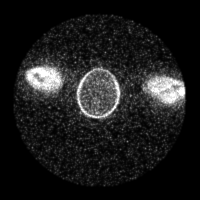

[Series 8: ct sk_thigh 5.0 b70f lung_bone · axial · 5.0mm · 0.82mm/px · z∈[+927,+1199]mm · 2 of 69 slices shown]
[im 1/69  bone]
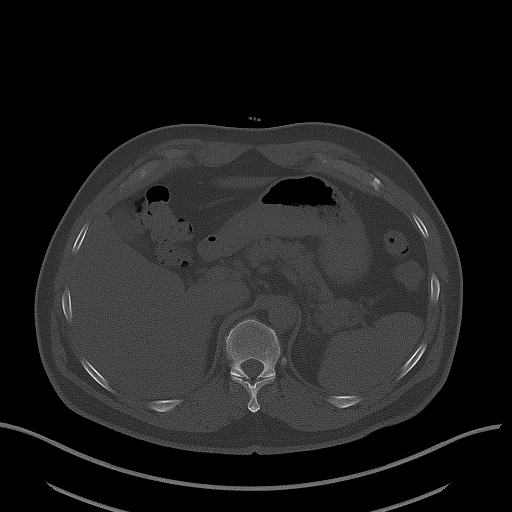
[im 69/69  bone]
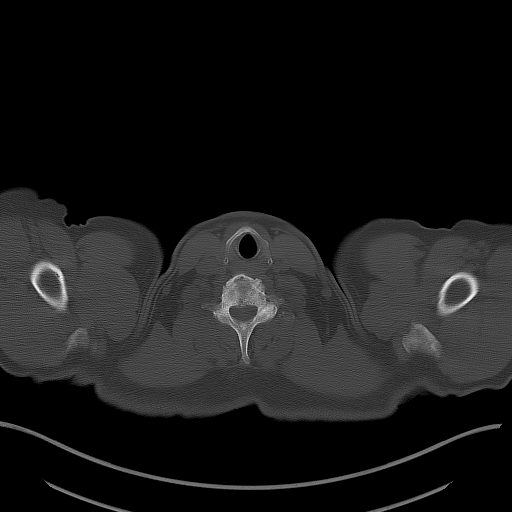

[Series 603: mip collection · coronal · 1.93mm/px · 1 of 32 slices shown]
[im 1/32]
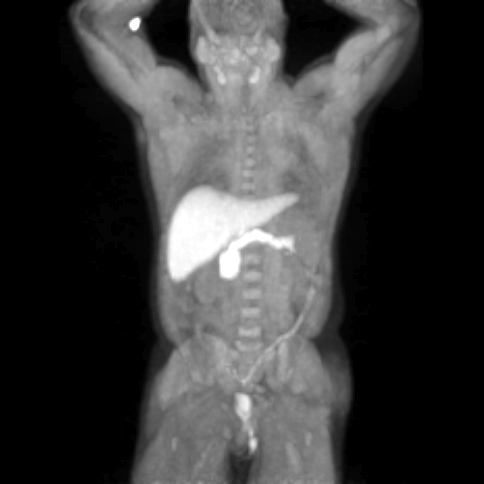

[Series 604: range-ct sk_thigh 5.0 (id)<alpha range> · 5 of 222 slices shown (1 of 2)]
[im 1/222]
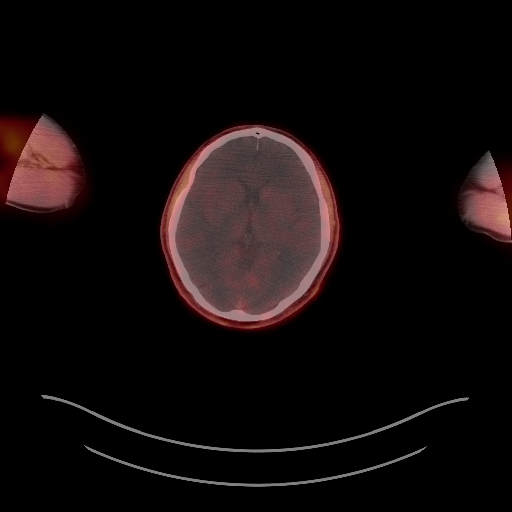
[im 56/222]
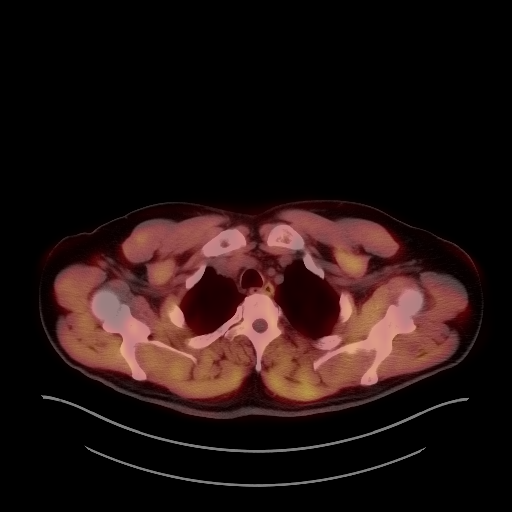
[im 111/222]
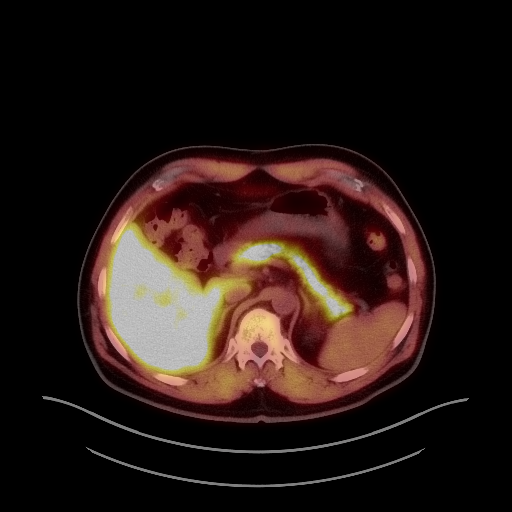
[im 166/222]
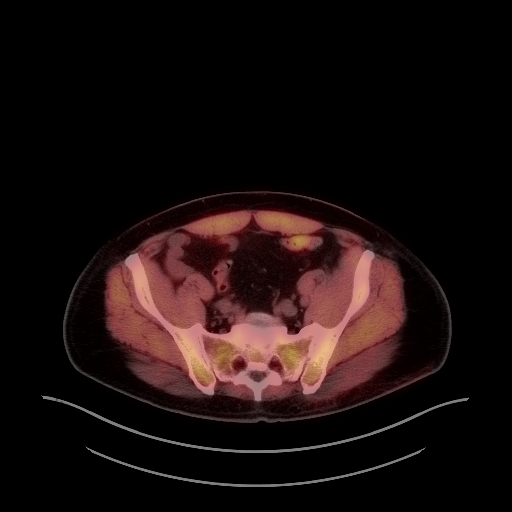
[im 222/222]
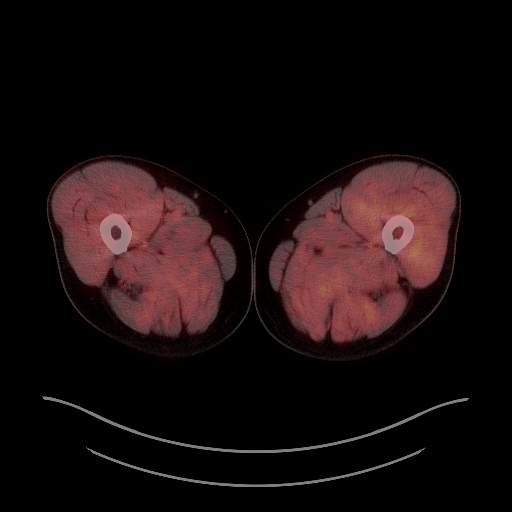

[Series 605: range-ct sk_thigh 5.0 (id)<alpha range> · 1 of 15 slices shown (2 of 2)]
[im 1/15]
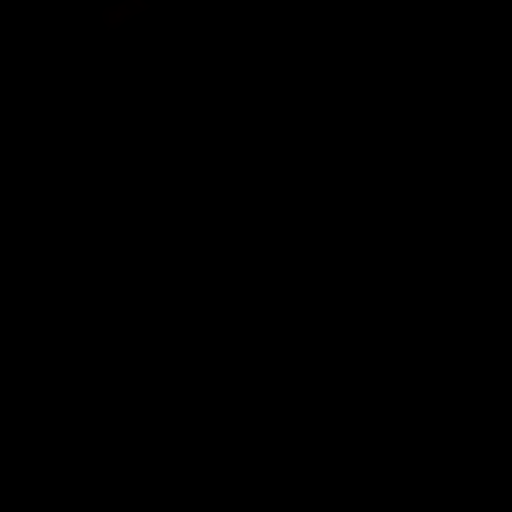

[25 of 25 positions shown; findings below may reference images not displayed]

FINDINGS: NECK

No radiotracer activity in neck lymph nodes.

CHEST

No radiotracer accumulation within mediastinal or hilar lymph nodes.
No suspicious pulmonary nodules on the CT scan.

ABDOMEN/PELVIS

No abnormal radiotracer activity within the liver, pancreas, adrenal
glands, or spleen. No radiotracer accumulation and nodes in the
abdomen or pelvis. Physiologic activity noted in the liver and
pancreas.

No focal activity in the prostate bed.

Sigmoid diverticulosis without evidence diverticulitis.

SKELETON

There is a round sclerotic lesion in the posterior LEFT iliac bone
measuring 9 mm (image 167, series 4) which does not have associated
radiotracer accumulation above background marrow activity. No focal
radiotracer accumulation within the skeleton.
IMPRESSION: 1. No evidence of prostate cancer nodal metastasis in the pelvis or
abdomen.
2. No evidence of local recurrence in prostate bed.
3. No evidence of distant metastatic disease or skeletal metastasis

## 2019-07-30 ENCOUNTER — Other Ambulatory Visit: Payer: Self-pay

## 2019-07-30 ENCOUNTER — Encounter (HOSPITAL_COMMUNITY): Payer: Self-pay | Admitting: *Deleted

## 2019-07-30 ENCOUNTER — Emergency Department (HOSPITAL_COMMUNITY)
Admission: EM | Admit: 2019-07-30 | Discharge: 2019-07-30 | Disposition: A | Discharge status: Home / Self Care | Payer: Federal, State, Local not specified - PPO | Attending: Emergency Medicine | Admitting: Emergency Medicine

## 2019-07-30 DIAGNOSIS — Z8546 Personal history of malignant neoplasm of prostate: Secondary | ICD-10-CM | POA: Diagnosis not present

## 2019-07-30 DIAGNOSIS — R319 Hematuria, unspecified: Secondary | ICD-10-CM | POA: Insufficient documentation

## 2019-07-30 DIAGNOSIS — Z79899 Other long term (current) drug therapy: Secondary | ICD-10-CM | POA: Insufficient documentation

## 2019-07-30 DIAGNOSIS — Z923 Personal history of irradiation: Secondary | ICD-10-CM | POA: Insufficient documentation

## 2019-07-30 DIAGNOSIS — N390 Urinary tract infection, site not specified: Secondary | ICD-10-CM | POA: Diagnosis not present

## 2019-07-30 DIAGNOSIS — R339 Retention of urine, unspecified: Secondary | ICD-10-CM | POA: Diagnosis present

## 2019-07-30 LAB — URINALYSIS, ROUTINE W REFLEX MICROSCOPIC
Bilirubin Urine: NEGATIVE
Glucose, UA: 100 mg/dL — AB
Ketones, ur: 40 mg/dL — AB
Nitrite: POSITIVE — AB
Protein, ur: >300 mg/dL — AB
Specific Gravity, Urine: 1.015 (ref 1.005–1.030)
pH: 7 (ref 5.0–8.0)

## 2019-07-30 LAB — CBC WITH DIFFERENTIAL/PLATELET
Abs Immature Granulocytes: 0.02 10*3/uL (ref 0.00–0.07)
Basophils Absolute: 0.1 10*3/uL (ref 0.0–0.1)
Basophils Relative: 1 %
Eosinophils Absolute: 0.1 10*3/uL (ref 0.0–0.5)
Eosinophils Relative: 1 %
HCT: 42.5 % (ref 39.0–52.0)
Hemoglobin: 15.2 g/dL (ref 13.0–17.0)
Immature Granulocytes: 0 %
Lymphocytes Relative: 12 %
Lymphs Abs: 0.9 10*3/uL (ref 0.7–4.0)
MCH: 33.6 pg (ref 26.0–34.0)
MCHC: 35.8 g/dL (ref 30.0–36.0)
MCV: 94 fL (ref 80.0–100.0)
Monocytes Absolute: 0.5 10*3/uL (ref 0.1–1.0)
Monocytes Relative: 7 %
Neutro Abs: 5.8 10*3/uL (ref 1.7–7.7)
Neutrophils Relative %: 79 %
Platelets: 190 10*3/uL (ref 150–400)
RBC: 4.52 MIL/uL (ref 4.22–5.81)
RDW: 12.1 % (ref 11.5–15.5)
WBC: 7.4 10*3/uL (ref 4.0–10.5)
nRBC: 0 % (ref 0.0–0.2)

## 2019-07-30 LAB — BASIC METABOLIC PANEL
Anion gap: 12 (ref 5–15)
BUN: 13 mg/dL (ref 6–20)
CO2: 22 mmol/L (ref 22–32)
Calcium: 9.2 mg/dL (ref 8.9–10.3)
Chloride: 101 mmol/L (ref 98–111)
Creatinine, Ser: 0.87 mg/dL (ref 0.61–1.24)
GFR calc Af Amer: >60 mL/min (ref >60)
GFR calc non Af Amer: >60 mL/min (ref >60)
Glucose, Bld: 118 mg/dL — ABNORMAL HIGH (ref 70–99)
Potassium: 3.5 mmol/L (ref 3.5–5.1)
Sodium: 135 mmol/L (ref 135–145)

## 2019-07-30 LAB — URINALYSIS, MICROSCOPIC (REFLEX): RBC / HPF: >50 RBC/hpf (ref 0–5)

## 2019-07-30 MED ORDER — CEPHALEXIN 500 MG PO CAPS
500.0000 mg | ORAL_CAPSULE | Freq: Once | ORAL | Status: AC
  Administered 2019-07-30: 23:00:00 500 mg via ORAL
  Filled 2019-07-30: qty 1

## 2019-07-30 MED ORDER — SODIUM CHLORIDE 0.9 % IV BOLUS
1000.0000 mL | Freq: Once | INTRAVENOUS | Status: AC
Start: 2019-07-30 — End: 2019-07-30
  Administered 2019-07-30: 21:00:00 1000 mL via INTRAVENOUS

## 2019-07-30 MED ORDER — LIDOCAINE VISCOUS HCL 2 % MT SOLN
15.0000 mL | Freq: Once | OROMUCOSAL | Status: DC
  Filled 2019-07-30: qty 15

## 2019-07-30 MED ORDER — LIDOCAINE HCL (CARDIAC) PF 100 MG/5ML IV SOSY
PREFILLED_SYRINGE | INTRAVENOUS | Status: AC
  Administered 2019-07-30: 100 mg
  Filled 2019-07-30: qty 5

## 2019-07-30 MED ORDER — CEPHALEXIN 500 MG PO CAPS: 500.0000 mg | ORAL_CAPSULE | Freq: Three times a day (TID) | ORAL | 0 refills | Status: DC

## 2019-07-30 NOTE — ED Triage Notes (Signed)
Pt has had blood in his urine with clots for several months on and off. Saw urology, suppose to have scope done on the 11th. Pt says that he has not urinated since about 1pm when he had blood in his urine.

## 2019-07-30 NOTE — ED Provider Notes (Signed)
[name] DEPT Provider Note   CSN: [id number] Arrival date & time: 07/30/19  1956     History   Chief Complaint Chief Complaint  Patient presents with  . Urinary Retention    HPI [name] [name] is a 63 y.o. male history of reflux, prostate cancer status post prostatectomy and radiation, here presenting with hematuria, urinary retention.  Patient states that he has been having hematuria for the last several months that is intermittent.  Today, he had more hematuria and since 1 PM, he states that he was unable to urinate.  He states that he has severe bladder spasms.  Patient has a history of prostate cancer and had prostatectomy and finished radiation.     The history is provided by the patient.    Past Medical History:  Diagnosis Date  . GERD (gastroesophageal reflux disease)   . Prostate cancer [facility])     Patient Active Problem List   Diagnosis Date Noted  . Malignant neoplasm of prostate ([place]) 07/12/2014    Past Surgical History:  Procedure Laterality Date  . left bicep surgery     reattached  . LYMPHADENECTOMY Bilateral 07/12/2014   Procedure: PELVIC LYMPH NODE DISSECTION;  Surgeon: Alexis Frock, MD;  Location: [facility] ORS;  Service: Urology;  Laterality: Bilateral;  . mucous seal     removed from lip  . ROBOT ASSISTED LAPAROSCOPIC RADICAL PROSTATECTOMY N/A 07/12/2014   Procedure: ROBOTIC ASSISTED LAPAROSCOPIC RADICAL PROSTATECTOMY , INDOCYANINE GREEN DYE INJECTION;  Surgeon: Alexis Frock, MD;  Location: [facility];  Service: Urology;  Laterality: N/A;  3.5 HRS         Home Medications    Prior to Admission medications   Medication Sig Start Date End Date Taking? Authorizing Provider  loratadine (CLARITIN) 10 MG tablet Take 10 mg by mouth daily.    [provider]  omeprazole (PRILOSEC OTC) 20 MG tablet Take 20 mg by mouth daily.    [provider]    Family History Family History  Problem Relation Age of Onset   . Heart disease Mother   . Cancer Father        throat  . Cancer Paternal Grandfather        ? prostate cancer    Social History Social History   Tobacco Use  . Smoking status: Never Smoker  . Smokeless tobacco: Former Systems developer    Types: Chew  Substance Use Topics  . Alcohol use: Yes    Alcohol/week: 2.0 standard drinks    Types: 2 Cans of beer per week    Comment: drinks daily   . Drug use: No     Allergies   Tetracyclines & related   Review of Systems Review of Systems  Genitourinary: Positive for difficulty urinating.  All other systems reviewed and are negative.    Physical Exam Updated Vital Signs BP (!) 175/111 (BP Location: Left Arm)   Pulse 85   Temp 98.2 F (36.8 C) (Oral)   Resp 16   Ht 5\' 9"  (1.753 m)   Wt 88.5 kg   SpO2 97%   BMI 28.80 kg/m   Physical Exam Vitals signs and nursing note reviewed.  HENT:     Head: Normocephalic.     Nose: Nose normal.     Mouth/Throat:     Mouth: Mucous membranes are moist.  Eyes:     Extraocular Movements: Extraocular movements intact.     Pupils: Pupils are equal, round, and reactive to light.  Neck:     Musculoskeletal: Normal range of motion.  Cardiovascular:     Rate and Rhythm: Normal rate and regular rhythm.     Pulses: Normal pulses.     Heart sounds: Normal heart sounds.  Pulmonary:     Effort: Pulmonary effort is normal.     Breath sounds: Normal breath sounds.  Abdominal:     General: Abdomen is flat.     Comments: + suprapubic tenderness, + distended bladder   Musculoskeletal: Normal range of motion.  Skin:    General: Skin is warm.     Capillary Refill: Capillary refill takes less than 2 seconds.  Neurological:     General: No focal deficit present.     Mental Status: He is alert and oriented to person, place, and time.  Psychiatric:        Mood and Affect: Mood normal.        Behavior: Behavior normal.        Thought Content: Thought content normal.      ED Treatments / Results   Labs (all labs ordered are listed, but only abnormal results are displayed) Labs Reviewed  URINE CULTURE  CBC WITH DIFFERENTIAL/PLATELET  URINALYSIS, ROUTINE W REFLEX MICROSCOPIC  BASIC METABOLIC PANEL    EKG None  Radiology No results found.  Procedures BLADDER CATHETERIZATION  Date/Time: 07/30/2019 9:08 PM Performed by: Drenda Freeze, MD Authorized by: Drenda Freeze, MD   Consent:    Consent obtained:  Verbal   Consent given by:  Patient   Risks discussed:  False passage and urethral injury   Alternatives discussed:  No treatment Pre-procedure details:    Procedure purpose:  Diagnostic   Preparation: Patient was prepped and draped in usual sterile fashion   Anesthesia (see MAR for exact dosages):    Anesthesia method:  Topical application   Topical anesthetic:  Lidocaine gel Procedure details:    Provider performed due to:  Complicated insertion   Catheter insertion:  Indwelling   Catheter size:  22 Fr   Bladder irrigation: yes     Number of attempts:  1   Urine characteristics:  Bloody Post-procedure details:    Patient tolerance of procedure:  Tolerated well, no immediate complications   (including critical care time)   Medications Ordered in ED Medications  sodium chloride 0.9 % bolus 1,000 mL (1,000 mLs Intravenous New Bag/Given 07/30/19 2053)  lidocaine (cardiac) 100 mg/80mL (XYLOCAINE) 100 MG/5ML injection 2% (100 mg  Given by Other 07/30/19 2051)     Initial Impression / Assessment and Plan / ED Course  I have reviewed the triage vital signs and the nursing notes.  Pertinent labs & imaging results that were available during my care of the patient were reviewed by me and considered in my medical decision making (see chart for details).       [name] [name] is a 63 y.o. male here with hematuria, urinary retention. Has previous prostate cancer s/p resection and radiation. Post void is 300. Will put in 3 way catheter and irrigate foley. If  hematuria doesn't improve, may need CBI.   10:25 PM Labs unremarkable. UA + blood, ? UTI. He had recent CT that showed possible cystitis. I irrigated foley and now is clear. Will dc home with keflex.    Final Clinical Impressions(s) / ED Diagnoses   Final diagnoses:  None    ED Discharge Orders    None       Drenda Freeze, MD 07/30/19  2226  

## 2019-07-30 NOTE — ED Notes (Signed)
Blader scan showed 25mL. Katie, RN aware.

## 2019-07-30 NOTE — Discharge Instructions (Addendum)
Take keflex three times daily for a week   See urologist this week to remove foley   Return to ER if the foley has clots or not draining.

## 2019-07-30 NOTE — ED Notes (Signed)
Leg bag drainage bag applied.

## 2019-08-01 LAB — URINE CULTURE: Culture: NO GROWTH

## 2019-08-02 ENCOUNTER — Encounter (HOSPITAL_BASED_OUTPATIENT_CLINIC_OR_DEPARTMENT_OTHER): Payer: Self-pay | Admitting: *Deleted

## 2019-08-02 ENCOUNTER — Other Ambulatory Visit: Payer: Self-pay

## 2019-08-02 ENCOUNTER — Other Ambulatory Visit (HOSPITAL_COMMUNITY)
Admission: RE | Admit: 2019-08-02 | Discharge: 2019-08-02 | Disposition: A | Discharge status: Home / Self Care | Payer: Federal, State, Local not specified - PPO | Source: Ambulatory Visit | Attending: Urology | Admitting: Urology

## 2019-08-02 ENCOUNTER — Other Ambulatory Visit: Payer: Self-pay | Admitting: Urology

## 2019-08-02 DIAGNOSIS — Z20828 Contact with and (suspected) exposure to other viral communicable diseases: Secondary | ICD-10-CM | POA: Diagnosis not present

## 2019-08-02 DIAGNOSIS — Z01812 Encounter for preprocedural laboratory examination: Secondary | ICD-10-CM | POA: Insufficient documentation

## 2019-08-02 NOTE — Progress Notes (Signed)
Spoke w/ via phone for pre-op interview--- [facility] needs dos----   ekg  needed         Lab results------bmet, cbc with dif, ua culture done 07-30-2019 epic/chart COVID test ------ 08-02-2019 Arrive at -------530 am 08-05-2019 NPO after ------midnight Medications to take morning of surgery -----cephelaxin, prilosec, venlafaxine Diabetic medication -----n/a Patient Special Instructions ----- Pre-Op special Istructions ----- Patient verbalized understanding of instructions that were given at this phone interview. Patient denies shortness of breath, chest pain, fever, cough a this phone interview.

## 2019-08-03 LAB — NOVEL CORONAVIRUS, NAA (HOSP ORDER, SEND-OUT TO REF LAB; TAT 18-24 HRS): SARS-CoV-2, NAA: NOT DETECTED

## 2019-08-04 ENCOUNTER — Other Ambulatory Visit: Payer: Self-pay | Admitting: Urology

## 2019-08-04 NOTE — Anesthesia Preprocedure Evaluation (Addendum)
Anesthesia Evaluation  Patient identified by MRN, date of birth, ID band Patient awake    Reviewed: Allergy & Precautions, H&P , NPO status , Patient's Chart, lab work & pertinent test results  Airway Mallampati: II  TM Distance: <3 FB Neck ROM: Full    Dental no notable dental hx. (+) Teeth Intact, Dental Advisory Given   Pulmonary neg pulmonary ROS,    Pulmonary exam normal breath sounds clear to auscultation       Cardiovascular hypertension, negative cardio ROS Normal cardiovascular exam Rhythm:Regular Rate:Normal     Neuro/Psych negative neurological ROS  negative psych ROS   GI/Hepatic Neg liver ROS, GERD  Medicated,  Endo/Other  negative endocrine ROS  Renal/GU negative Renal ROS   S/P Radical Prostatectomy-robotic 2015 negative genitourinary   Musculoskeletal negative musculoskeletal ROS (+)   Abdominal   Peds negative pediatric ROS (+)  Hematology negative hematology ROS (+)   Anesthesia Other Findings   Reproductive/Obstetrics negative OB ROS                         Anesthesia Physical  Anesthesia Plan  ASA: II  Anesthesia Plan: General   Post-op Pain Management:    Induction: Intravenous  PONV Risk Score and Plan: 2 and Ondansetron, Dexamethasone and Treatment may vary due to age or medical condition  Airway Management Planned: Oral ETT and LMA  Additional Equipment:   Intra-op Plan:   Post-operative Plan: Extubation in OR  Informed Consent: I have reviewed the patients History and Physical, chart, labs and discussed the procedure including the risks, benefits and alternatives for the proposed anesthesia with the patient or authorized representative who has indicated his/her understanding and acceptance.     Dental advisory given  Plan Discussed with: CRNA, Anesthesiologist and Surgeon  Anesthesia Plan Comments:         Anesthesia Quick Evaluation

## 2019-08-05 ENCOUNTER — Ambulatory Visit (HOSPITAL_BASED_OUTPATIENT_CLINIC_OR_DEPARTMENT_OTHER)
Admission: RE | Admit: 2019-08-05 | Discharge: 2019-08-05 | Disposition: A | Discharge status: Home / Self Care | Payer: Federal, State, Local not specified - PPO | Attending: Urology | Admitting: Urology

## 2019-08-05 ENCOUNTER — Ambulatory Visit (HOSPITAL_BASED_OUTPATIENT_CLINIC_OR_DEPARTMENT_OTHER): Payer: Federal, State, Local not specified - PPO | Admitting: Anesthesiology

## 2019-08-05 ENCOUNTER — Other Ambulatory Visit: Payer: Self-pay

## 2019-08-05 ENCOUNTER — Encounter (HOSPITAL_BASED_OUTPATIENT_CLINIC_OR_DEPARTMENT_OTHER): Payer: Self-pay | Admitting: *Deleted

## 2019-08-05 ENCOUNTER — Encounter (HOSPITAL_BASED_OUTPATIENT_CLINIC_OR_DEPARTMENT_OTHER)
Admission: RE | Disposition: A | Discharge status: Home / Self Care | Payer: Self-pay | Source: Home / Self Care | Attending: Urology

## 2019-08-05 DIAGNOSIS — N210 Calculus in bladder: Secondary | ICD-10-CM | POA: Insufficient documentation

## 2019-08-05 DIAGNOSIS — I10 Essential (primary) hypertension: Secondary | ICD-10-CM | POA: Diagnosis not present

## 2019-08-05 DIAGNOSIS — K219 Gastro-esophageal reflux disease without esophagitis: Secondary | ICD-10-CM | POA: Diagnosis not present

## 2019-08-05 DIAGNOSIS — Z923 Personal history of irradiation: Secondary | ICD-10-CM | POA: Insufficient documentation

## 2019-08-05 DIAGNOSIS — Z79899 Other long term (current) drug therapy: Secondary | ICD-10-CM | POA: Insufficient documentation

## 2019-08-05 DIAGNOSIS — N3041 Irradiation cystitis with hematuria: Secondary | ICD-10-CM | POA: Diagnosis not present

## 2019-08-05 DIAGNOSIS — Z8546 Personal history of malignant neoplasm of prostate: Secondary | ICD-10-CM | POA: Diagnosis not present

## 2019-08-05 HISTORY — DX: Presence of other specified devices: Z97.8

## 2019-08-05 HISTORY — PX: CYSTOSCOPY WITH FULGERATION: SHX6638

## 2019-08-05 HISTORY — DX: Retention of urine, unspecified: R33.9

## 2019-08-05 HISTORY — DX: Personal history of urinary calculi: Z87.442

## 2019-08-05 HISTORY — DX: Essential (primary) hypertension: I10

## 2019-08-05 SURGERY — CYSTOSCOPY, WITH BLADDER FULGURATION
Anesthesia: General

## 2019-08-05 MED ORDER — FENTANYL CITRATE (PF) 100 MCG/2ML IJ SOLN
INTRAMUSCULAR | Status: AC
  Filled 2019-08-05: qty 2

## 2019-08-05 MED ORDER — LACTATED RINGERS IV SOLN
INTRAVENOUS | Status: DC
  Administered 2019-08-05 (×2): via INTRAVENOUS
  Filled 2019-08-05: qty 1000

## 2019-08-05 MED ORDER — ONDANSETRON HCL 4 MG/2ML IJ SOLN
INTRAMUSCULAR | Status: DC | PRN
  Administered 2019-08-05: 4 mg via INTRAVENOUS

## 2019-08-05 MED ORDER — OXYCODONE HCL 5 MG/5ML PO SOLN
5.0000 mg | Freq: Once | ORAL | Status: DC | PRN
  Filled 2019-08-05: qty 5

## 2019-08-05 MED ORDER — FENTANYL CITRATE (PF) 100 MCG/2ML IJ SOLN
25.0000 ug | INTRAMUSCULAR | Status: DC | PRN
  Filled 2019-08-05: qty 1

## 2019-08-05 MED ORDER — ONDANSETRON HCL 4 MG/2ML IJ SOLN
INTRAMUSCULAR | Status: AC
  Filled 2019-08-05: qty 2

## 2019-08-05 MED ORDER — DEXAMETHASONE SODIUM PHOSPHATE 10 MG/ML IJ SOLN
INTRAMUSCULAR | Status: AC
  Filled 2019-08-05: qty 1

## 2019-08-05 MED ORDER — ACETAMINOPHEN 325 MG PO TABS
325.0000 mg | ORAL_TABLET | ORAL | Status: DC | PRN
  Administered 2019-08-05: 1000 mg via ORAL
  Filled 2019-08-05: qty 2

## 2019-08-05 MED ORDER — PROPOFOL 10 MG/ML IV BOLUS
INTRAVENOUS | Status: DC | PRN
  Administered 2019-08-05: 170 mg via INTRAVENOUS

## 2019-08-05 MED ORDER — ACETAMINOPHEN 500 MG PO TABS
ORAL_TABLET | ORAL | Status: AC
  Filled 2019-08-05: qty 2

## 2019-08-05 MED ORDER — GENTAMICIN SULFATE 40 MG/ML IJ SOLN
5.0000 mg/kg | INTRAVENOUS | Status: AC
  Administered 2019-08-05: 380 mg via INTRAVENOUS
  Filled 2019-08-05: qty 9.5

## 2019-08-05 MED ORDER — CEFAZOLIN SODIUM-DEXTROSE 2-4 GM/100ML-% IV SOLN
INTRAVENOUS | Status: AC
  Filled 2019-08-05: qty 100

## 2019-08-05 MED ORDER — TRAMADOL HCL 50 MG PO TABS: 50.0000 mg | ORAL_TABLET | Freq: Four times a day (QID) | ORAL | 0 refills | Status: DC | PRN

## 2019-08-05 MED ORDER — LIDOCAINE 2% (20 MG/ML) 5 ML SYRINGE
INTRAMUSCULAR | Status: AC
  Filled 2019-08-05: qty 5

## 2019-08-05 MED ORDER — KETOROLAC TROMETHAMINE 30 MG/ML IJ SOLN
INTRAMUSCULAR | Status: DC | PRN
  Administered 2019-08-05: 30 mg via INTRAVENOUS

## 2019-08-05 MED ORDER — KETOROLAC TROMETHAMINE 30 MG/ML IJ SOLN
INTRAMUSCULAR | Status: AC
  Filled 2019-08-05: qty 1

## 2019-08-05 MED ORDER — MEPERIDINE HCL 25 MG/ML IJ SOLN
6.2500 mg | INTRAMUSCULAR | Status: DC | PRN
  Filled 2019-08-05: qty 1

## 2019-08-05 MED ORDER — LIDOCAINE 2% (20 MG/ML) 5 ML SYRINGE
INTRAMUSCULAR | Status: DC | PRN
  Administered 2019-08-05: 60 mg via INTRAVENOUS

## 2019-08-05 MED ORDER — MIDAZOLAM HCL 2 MG/2ML IJ SOLN
INTRAMUSCULAR | Status: AC
  Filled 2019-08-05: qty 2

## 2019-08-05 MED ORDER — PROPOFOL 10 MG/ML IV BOLUS
INTRAVENOUS | Status: AC
  Filled 2019-08-05: qty 40

## 2019-08-05 MED ORDER — ONDANSETRON HCL 4 MG/2ML IJ SOLN
4.0000 mg | Freq: Once | INTRAMUSCULAR | Status: DC | PRN
  Filled 2019-08-05: qty 2

## 2019-08-05 MED ORDER — SENNOSIDES-DOCUSATE SODIUM 8.6-50 MG PO TABS: 1.0000 | ORAL_TABLET | Freq: Two times a day (BID) | ORAL | 0 refills | Status: DC

## 2019-08-05 MED ORDER — OXYCODONE HCL 5 MG PO TABS
5.0000 mg | ORAL_TABLET | Freq: Once | ORAL | Status: DC | PRN
  Filled 2019-08-05: qty 1

## 2019-08-05 MED ORDER — DEXAMETHASONE SODIUM PHOSPHATE 10 MG/ML IJ SOLN
INTRAMUSCULAR | Status: DC | PRN
  Administered 2019-08-05: 5 mg via INTRAVENOUS

## 2019-08-05 MED ORDER — MIDAZOLAM HCL 2 MG/2ML IJ SOLN
INTRAMUSCULAR | Status: DC | PRN
  Administered 2019-08-05: 2 mg via INTRAVENOUS

## 2019-08-05 MED ORDER — ACETAMINOPHEN 160 MG/5ML PO SOLN
325.0000 mg | ORAL | Status: DC | PRN
  Filled 2019-08-05: qty 20.3

## 2019-08-05 MED ORDER — FENTANYL CITRATE (PF) 100 MCG/2ML IJ SOLN
INTRAMUSCULAR | Status: DC | PRN
  Administered 2019-08-05 (×3): 50 ug via INTRAVENOUS

## 2019-08-05 SURGICAL SUPPLY — 20 items
BAG DRAIN URO-CYSTO SKYTR STRL (DRAIN) ×1 IMPLANT
BAG DRN UROCATH (DRAIN)
CATH ROBINSON RED A/P 14FR (CATHETERS) IMPLANT
CLOTH BEACON ORANGE TIMEOUT ST (SAFETY) ×3 IMPLANT
ELECT REM PT RETURN 9FT ADLT (ELECTROSURGICAL) ×3
ELECTRODE REM PT RTRN 9FT ADLT (ELECTROSURGICAL) ×1 IMPLANT
GLOVE BIO SURGEON STRL SZ7.5 (GLOVE) ×3 IMPLANT
GOWN STRL REUS W/ TWL LRG LVL3 (GOWN DISPOSABLE) ×3 IMPLANT
GOWN STRL REUS W/TWL LRG LVL3 (GOWN DISPOSABLE) ×9
IV NS IRRIG 3000ML ARTHROMATIC (IV SOLUTION) ×4 IMPLANT
KIT TURNOVER CYSTO (KITS) ×3 IMPLANT
LOOP CUT BIPOLAR 24F LRG (ELECTROSURGICAL) ×2 IMPLANT
MANIFOLD NEPTUNE II (INSTRUMENTS) IMPLANT
NDL HYPO 18GX1.5 BLUNT FILL (NEEDLE) IMPLANT
NEEDLE HYPO 18GX1.5 BLUNT FILL (NEEDLE) IMPLANT
PACK CYSTO (CUSTOM PROCEDURE TRAY) ×3 IMPLANT
SYR 20CC LL (SYRINGE) ×2 IMPLANT
TUBE CONNECTING 12'X1/4 (SUCTIONS) ×1
TUBE CONNECTING 12X1/4 (SUCTIONS) ×1 IMPLANT
WATER STERILE IRR 3000ML UROMA (IV SOLUTION) ×1 IMPLANT

## 2019-08-05 NOTE — Discharge Instructions (Signed)
1 - You may have urinary urgency (bladder spasms) and bloody urine on / off x few days. This is normal.  2 - Call MD or go to ER for fever >102, severe pain / nausea / vomiting not relieved by medications, or acute change in medical status  CYSTOSCOPY HOME CARE INSTRUCTIONS  Activity: Rest for the remainder of the day.  Do not drive or operate equipment today.  You may resume normal activities in one to two days as instructed by your physician.   Meals: Drink plenty of liquids and eat light foods such as gelatin or soup this evening.  You may return to a normal meal plan tomorrow.  Return to Work: You may return to work in one to two days or as instructed by your physician.  Special Instructions / Symptoms: Call your physician if any of these symptoms occur:   -persistent or heavy bleeding  -bleeding which continues after first few urination  -large blood clots that are difficult to pass  -urine stream diminishes or stops completely  -fever equal to or higher than 101 degrees Farenheit.  -cloudy urine with a strong, foul odor  -severe pain  Females should always wipe from front to back after elimination.  You may feel some burning pain when you urinate.  This should disappear with time.  Applying moist heat to the lower abdomen or a hot tub bath may help relieve the pain.    Post Anesthesia Home Care Instructions  Activity: Get plenty of rest for the remainder of the day. A responsible individual must stay with you for 24 hours following the procedure.  For the next 24 hours, DO NOT: -Drive a car -Paediatric nurse -Drink alcoholic beverages -Take any medication unless instructed by your physician -Make any legal decisions or sign important papers.  Meals: Start with liquid foods such as gelatin or soup. Progress to regular foods as tolerated. Avoid greasy, spicy, heavy foods. If nausea and/or vomiting occur, drink only clear liquids until the nausea and/or vomiting subsides.  Call your physician if vomiting continues.  Special Instructions/Symptoms: Your throat may feel dry or sore from the anesthesia or the breathing tube placed in your throat during surgery. If this causes discomfort, gargle with warm salt water. The discomfort should disappear within 24 hours.   May take Ibuprofen, Advil, Motrin, or Aleve after 2:30 PM.

## 2019-08-05 NOTE — Anesthesia Postprocedure Evaluation (Signed)
Anesthesia Post Note  Patient: [name] [name]  Procedure(s) Performed: CYSTOSCOPY CLOT EVACUATION WITH FULGERATION (N/A )     Patient location during evaluation: PACU Anesthesia Type: General Level of consciousness: awake and alert Pain management: pain level controlled Vital Signs Assessment: post-procedure vital signs reviewed and stable Respiratory status: spontaneous breathing, nonlabored ventilation, respiratory function stable and patient connected to nasal cannula oxygen Cardiovascular status: blood pressure returned to baseline and stable Postop Assessment: no apparent nausea or vomiting Anesthetic complications: no    Last Vitals:  Vitals:   08/05/19 0610 08/05/19 0829  BP: (!) 149/84 (!) (P) 164/90  Pulse: 69   Resp: 16   Temp: 36.8 C (P) 36.8 C    Last Pain:  Vitals:   08/05/19 0610  TempSrc: Oral                 Meline Russaw

## 2019-08-05 NOTE — Op Note (Signed)
NAME: [name] [name], HEIS MEDICAL RECORD [id number] ACCOUNT [id number] DATE OF BIRTH:02-14-1956 FACILITY: [facility] LOCATION: [facility] PHYSICIAN:Dru Primeau, MD  OPERATIVE REPORT  DATE OF PROCEDURE:  08/05/2019  PREOPERATIVE DIAGNOSIS:  Gross hematuria, history of locally advanced prostate cancer status post surgery and radiation.  PROCEDURE:  Cystoscopy, clot evacuation, fulguration of bleeders.  ESTIMATED BLOOD LOSS:  Approximately 50 mL of old formed blood, minimal new blood.  FINDINGS: 1.  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 2.  Complete resolution of all bleeding following fulguration. 3.  Very small bladder stone on exposed prior DVC staple.  This was completely removed.  No evidence of additional intraluminal protrusion.  INDICATIONS:  The patient is a very pleasant 63 year old man with history of locally advanced prostate cancer status post surgery, salvage radiation and now on hormone therapy.  He was found on workup of gross hematuria with increasing clots to be in  urinary retention by ER evaluation.  Foley catheter was placed at that time for clot retention.  He had a recent hematuria CT that was unremarkable for upper tract etiologies.  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  Informed consent was then placed in medical record.  DESCRIPTION OF PROCEDURE:  The patient was identified.  Procedure being cystoscopy, clot evacuation, fulguration was confirmed.  Procedure timeout was performed.  Intravenous antibiotics administered.  General anesthesia induced.  The patient was placed  into a low lithotomy position, sterile field was created prepped and draped base of the penis, perineum and proximal thighs using  iodine.  Cystourethroscopy was performed with a 26-French resectoscope sheath with visual obturator.  Inspection of anterior  and posterior urethra did reveal one very small stone approximately 2 mm or so at the anterior 12 o'clock bladder neck with a tiny exposed staple consistent with prior DVC stitch 5 minutes post-radiation changes.  This was dislodged with the  resectoscope loop and irrigated.  There was a small amount of formed clot in the bladder.  This was irrigated approximately 50 mL total.  There was some mild mucosal oozing from several areas of radiation cystitis mostly supratrigonal midline and left  wall.  These were then fulgurated with the resectoscope loop, which resulted in complete hemostasis.  Ureteral orifices were uninvolved and visibly patent.  As we removed the clot today, and the clot was the significant factor driving his retention, it  was not felt that further catheterization would be warranted as that would likely only aggravate his radiation cystitis changes.  As such, the bladder was partially empty per cystoscope.  Procedure terminated.  The patient tolerated the procedure well.   No immediate periprocedural complications.  The patient was taken to the postanesthesia care in stable condition with plan for discharge home.  TN/NUANCE  D:08/05/2019 T:08/05/2019 JOB:[id number]

## 2019-08-05 NOTE — H&P (Signed)
[name] [name] is an 63 y.o. male.    Chief Complaint: Pre-OP Cysto-Clot Evac / Fulgeration  HPI:  1 - Recurrent Prostate Cancer - s/p robotic radical prostatectomy with ICG sentinal + template lymphadenecotmy 07/2014 for [id number] Gleason 7 adenocarcinoma with negative margins. Pre-op PSA 11.93. TRUS 6mL.    Recent Abbreviated Course;   11/2015 PSA 0.05 ==> 68 Gy adjuvant XRT under care [facility]; 03/2016 PSA 0.09; 06/2016 PSA 0.13  01/2017 PSA 0.62 ==> Start [facility] with Lupron 45, F-18 PET-CT without measurable disease; 07/2017 PSA <0.01 Lupron 45;  01/2019 PSA <0.01 / T 14 ==> Lupron 45    2 - Gross Hematuria / Clot Retention - few episodes visible, some with small clots at times of cough or valsalva 07/2019. H/o XRT as adjuvant therapy in prostate cancer. Non smoker / solvent exposure. Hematuria CT w/o upper tract lesions. Cysto pending.   PMH sig for colon polyps, GERD. No CV disease. Only prior surgery prostatectomy.   Today [facility] is seen to proceed with cysto, clot evacuation, fulgeration for diagnostic and theraputic intent in setting of recurrent gross hematuia. NO interval fevers. C19 screen negative.      Past Medical History:  Diagnosis Date  . Foley catheter in place    placed 07-30-2019  . GERD (gastroesophageal reflux disease)   . History of kidney stones   . Hypertension   . Prostate cancer ([place]) 2015  . Urinary retention     Past Surgical History:  Procedure Laterality Date  . left bicep surgery     reattached  . LYMPHADENECTOMY Bilateral 07/12/2014   Procedure: PELVIC LYMPH NODE DISSECTION;  Surgeon: Alexis Frock, MD;  Location: [facility];  Service: Urology;  Laterality: Bilateral;  . mucous seal  age 78   removed from lip  . ROBOT ASSISTED LAPAROSCOPIC RADICAL PROSTATECTOMY N/A 07/12/2014   Procedure: ROBOTIC ASSISTED LAPAROSCOPIC RADICAL PROSTATECTOMY , INDOCYANINE GREEN DYE INJECTION;  Surgeon: Alexis Frock, MD;  Location: [facility];  Service: Urology;  Laterality:  N/A;  3.5 HRS     Family History  Problem Relation Age of Onset  . Heart disease Mother   . Cancer Father        throat  . Cancer Paternal Grandfather        ? prostate cancer   Social History:  reports that he has never smoked. He quit smokeless tobacco use about 25 years ago.  His smokeless tobacco use included chew. He reports current alcohol use of about 2.0 standard drinks of alcohol per week. He reports current drug use. Drug: Marijuana.  Allergies:  Allergies  Allergen Reactions  . Tetracyclines & Related Rash    Medications Prior to Admission  Medication Sig Dispense Refill  . cephALEXin (KEFLEX) 500 MG capsule Take 1 capsule (500 mg total) by mouth 3 (three) times daily. 21 capsule 0  . lisinopril-hydrochlorothiazide (ZESTORETIC) 10-12.5 MG tablet Take 1 tablet by mouth daily.    Marland Kitchen loratadine (CLARITIN) 10 MG tablet Take 10 mg by mouth daily.    Marland Kitchen omeprazole (PRILOSEC OTC) 20 MG tablet Take 20 mg by mouth daily.    Marland Kitchen venlafaxine XR (EFFEXOR-XR) 75 MG 24 hr capsule Take 75 mg by mouth daily with breakfast.      No results found for this or any previous visit (from the past 48 hour(s)). No results found.  Review of Systems  Constitutional: Negative for chills and fever.  Genitourinary: Positive for hematuria.  All other systems reviewed and are negative.  Blood pressure (!) 149/84, pulse 69, temperature 98.2 F (36.8 C), temperature source Oral, resp. rate 16, height 5\' 8"  (1.727 m), weight 89.9 kg. Physical Exam  Constitutional: He appears well-developed.  HENT:  Head: Normocephalic.  Eyes: Pupils are equal, round, and reactive to light.  Neck: Normal range of motion.  Cardiovascular: Normal rate.  Respiratory: Effort normal.  GI: Soft.  Prior scars w/o hernias.   Genitourinary:    Genitourinary Comments: Foley in place with pink unrine. NO CVAT   Neurological: He is alert.  Skin: Skin is warm.  Psychiatric: He has a normal mood and affect.      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 Risks, benefits, alternatives, expected peri-op course discussed previously and reiterated today.   Alexis Frock, MD 08/05/2019, 6:31 AM

## 2019-08-05 NOTE — Anesthesia Procedure Notes (Signed)
Procedure Name: LMA Insertion Date/Time: 08/05/2019 7:33 AM Performed by: Wanita Chamberlain, CRNA Pre-anesthesia Checklist: Patient identified, Emergency Drugs available, Suction available and Patient being monitored Patient Re-evaluated:Patient Re-evaluated prior to induction Oxygen Delivery Method: Circle system utilized Preoxygenation: Pre-oxygenation with 100% oxygen Induction Type: IV induction Ventilation: Mask ventilation without difficulty LMA: LMA inserted LMA Size: 4.0 Number of attempts: 1 Placement Confirmation: breath sounds checked- equal and bilateral,  CO2 detector and positive ETCO2 Tube secured with: Tape Dental Injury: Teeth and Oropharynx as per pre-operative assessment

## 2019-08-05 NOTE — Brief Op Note (Signed)
08/05/2019  8:15 AM  PATIENT:  [name] [name]  63 y.o. male  PRE-OPERATIVE DIAGNOSIS:  GROSS HEMATURIA  POST-OPERATIVE DIAGNOSIS:  GROSS HEMATURIA  PROCEDURE:  Procedure(s): CYSTOSCOPY CLOT EVACUATION WITH FULGERATION (N/A)  SURGEON:  Surgeon(s) and Role:    * Alexis Frock, MD - Primary  PHYSICIAN ASSISTANT:   ASSISTANTS: none   ANESTHESIA:   general  EBL:  minimal   BLOOD ADMINISTERED:none  DRAINS: none   LOCAL MEDICATIONS USED:  NONE  SPECIMEN:  No Specimen  DISPOSITION OF SPECIMEN:  N/A  COUNTS:  YES  TOURNIQUET:  * No tourniquets in log *  DICTATION: .Other Dictation: Dictation Number [id number]  PLAN OF CARE: Discharge to home after PACU  PATIENT DISPOSITION:  PACU - hemodynamically stable.   Delay start of Pharmacological VTE agent (>24hrs) due to surgical blood loss or risk of bleeding: yes

## 2019-08-05 NOTE — Transfer of Care (Signed)
Immediate Anesthesia Transfer of Care Note  Patient: [name] [name]  Procedure(s) Performed: CYSTOSCOPY CLOT EVACUATION WITH FULGERATION (N/A )  Patient Location: PACU  Anesthesia Type:General  Level of Consciousness: awake, alert , oriented and patient cooperative  Airway & Oxygen Therapy: Patient Spontanous Breathing and Patient connected to nasal cannula oxygen  Post-op Assessment: Report given to RN and Post -op Vital signs reviewed and stable  Post vital signs: Reviewed and stable  Last Vitals:  Vitals Value Taken Time  BP    Temp    Pulse    Resp    SpO2      Last Pain:  Vitals:   08/05/19 0610  TempSrc: Oral         Complications: No apparent anesthesia complications

## 2019-08-08 ENCOUNTER — Encounter (HOSPITAL_BASED_OUTPATIENT_CLINIC_OR_DEPARTMENT_OTHER): Payer: Self-pay | Admitting: Urology

## 2019-08-19 ENCOUNTER — Encounter (HOSPITAL_COMMUNITY): Payer: Self-pay | Admitting: Emergency Medicine

## 2019-08-19 ENCOUNTER — Other Ambulatory Visit: Payer: Self-pay

## 2019-08-19 ENCOUNTER — Inpatient Hospital Stay (HOSPITAL_COMMUNITY)
Admission: EM | Admit: 2019-08-19 | Discharge: 2019-08-21 | DRG: 670 | Disposition: A | Discharge status: Home / Self Care | Payer: Federal, State, Local not specified - PPO | Attending: Urology | Admitting: Urology

## 2019-08-19 DIAGNOSIS — R339 Retention of urine, unspecified: Secondary | ICD-10-CM | POA: Diagnosis present

## 2019-08-19 DIAGNOSIS — Z20828 Contact with and (suspected) exposure to other viral communicable diseases: Secondary | ICD-10-CM | POA: Diagnosis present

## 2019-08-19 DIAGNOSIS — K219 Gastro-esophageal reflux disease without esophagitis: Secondary | ICD-10-CM | POA: Diagnosis present

## 2019-08-19 DIAGNOSIS — Y731 Therapeutic (nonsurgical) and rehabilitative gastroenterology and urology devices associated with adverse incidents: Secondary | ICD-10-CM | POA: Diagnosis present

## 2019-08-19 DIAGNOSIS — Z8249 Family history of ischemic heart disease and other diseases of the circulatory system: Secondary | ICD-10-CM

## 2019-08-19 DIAGNOSIS — R319 Hematuria, unspecified: Secondary | ICD-10-CM | POA: Diagnosis present

## 2019-08-19 DIAGNOSIS — Z87891 Personal history of nicotine dependence: Secondary | ICD-10-CM

## 2019-08-19 DIAGNOSIS — Z881 Allergy status to other antibiotic agents status: Secondary | ICD-10-CM

## 2019-08-19 DIAGNOSIS — Y842 Radiological procedure and radiotherapy as the cause of abnormal reaction of the patient, or of later complication, without mention of misadventure at the time of the procedure: Secondary | ICD-10-CM | POA: Diagnosis present

## 2019-08-19 DIAGNOSIS — R310 Gross hematuria: Secondary | ICD-10-CM | POA: Diagnosis not present

## 2019-08-19 DIAGNOSIS — Z8546 Personal history of malignant neoplasm of prostate: Secondary | ICD-10-CM

## 2019-08-19 DIAGNOSIS — I10 Essential (primary) hypertension: Secondary | ICD-10-CM | POA: Diagnosis present

## 2019-08-19 DIAGNOSIS — Z79899 Other long term (current) drug therapy: Secondary | ICD-10-CM

## 2019-08-19 DIAGNOSIS — N3289 Other specified disorders of bladder: Secondary | ICD-10-CM | POA: Diagnosis present

## 2019-08-19 DIAGNOSIS — N3041 Irradiation cystitis with hematuria: Principal | ICD-10-CM | POA: Diagnosis present

## 2019-08-19 DIAGNOSIS — T83031A Leakage of indwelling urethral catheter, initial encounter: Secondary | ICD-10-CM | POA: Diagnosis present

## 2019-08-19 DIAGNOSIS — Z9079 Acquired absence of other genital organ(s): Secondary | ICD-10-CM

## 2019-08-19 DIAGNOSIS — Z87442 Personal history of urinary calculi: Secondary | ICD-10-CM

## 2019-08-19 LAB — CBC
HCT: 26.1 % — ABNORMAL LOW (ref 39.0–52.0)
Hemoglobin: 9.2 g/dL — ABNORMAL LOW (ref 13.0–17.0)
MCH: 33.3 pg (ref 26.0–34.0)
MCHC: 35.2 g/dL (ref 30.0–36.0)
MCV: 94.6 fL (ref 80.0–100.0)
Platelets: 271 10*3/uL (ref 150–400)
RBC: 2.76 MIL/uL — ABNORMAL LOW (ref 4.22–5.81)
RDW: 11.9 % (ref 11.5–15.5)
WBC: 9.6 10*3/uL (ref 4.0–10.5)
nRBC: 0 % (ref 0.0–0.2)

## 2019-08-19 LAB — URINALYSIS, ROUTINE W REFLEX MICROSCOPIC: Hgb urine dipstick: NEGATIVE

## 2019-08-19 LAB — BASIC METABOLIC PANEL
Anion gap: 11 (ref 5–15)
BUN: 11 mg/dL (ref 6–20)
CO2: 22 mmol/L (ref 22–32)
Calcium: 8.5 mg/dL — ABNORMAL LOW (ref 8.9–10.3)
Chloride: 99 mmol/L (ref 98–111)
Creatinine, Ser: 0.87 mg/dL (ref 0.61–1.24)
GFR calc Af Amer: >60 mL/min (ref >60)
GFR calc non Af Amer: >60 mL/min (ref >60)
Glucose, Bld: 102 mg/dL — ABNORMAL HIGH (ref 70–99)
Potassium: 3.4 mmol/L — ABNORMAL LOW (ref 3.5–5.1)
Sodium: 132 mmol/L — ABNORMAL LOW (ref 135–145)

## 2019-08-19 LAB — URINALYSIS, MICROSCOPIC (REFLEX)
Bacteria, UA: NONE SEEN
RBC / HPF: >50 RBC/hpf (ref 0–5)
Squamous Epithelial / HPF: NONE SEEN (ref 0–5)

## 2019-08-19 MED ORDER — SODIUM CHLORIDE 0.9 % IV BOLUS
1000.0000 mL | Freq: Once | INTRAVENOUS | Status: AC
  Administered 2019-08-19: 1000 mL via INTRAVENOUS

## 2019-08-19 MED ORDER — SODIUM CHLORIDE 0.9 % IR SOLN
3000.0000 mL | Status: DC
  Administered 2019-08-19 – 2019-08-21 (×6): 3000 mL

## 2019-08-19 MED ORDER — ONDANSETRON HCL 4 MG/2ML IJ SOLN
4.0000 mg | Freq: Three times a day (TID) | INTRAMUSCULAR | Status: AC | PRN
  Administered 2019-08-20: 4 mg via INTRAVENOUS
  Filled 2019-08-19: qty 2

## 2019-08-19 MED ORDER — HYDROCODONE-ACETAMINOPHEN 5-325 MG PO TABS
1.0000 | ORAL_TABLET | Freq: Four times a day (QID) | ORAL | Status: DC | PRN
  Administered 2019-08-20 (×3): 1 via ORAL
  Filled 2019-08-19 (×3): qty 1

## 2019-08-19 NOTE — ED Provider Notes (Signed)
[facility] DEPT Provider Note   CSN: [id number] Arrival date & time: 08/19/19  1920     History   Chief Complaint Chief Complaint  Patient presents with  . Urinary Retention  . Hemorrhoids    HPI LEVAR KNIERIM is a 63 y.o. male hx HTN, prostate cancer s/p prostatectomy and radiation, here presenting with hematuria and urinary retention.  Patient was seen 2 weeks ago by me and I placed a three-way catheter. Patient then had cystoscopy by Dr. Tresa Moore on 10/2.  Patient then had a 45 French Foley placed.  He states that he has persistent hematuria initially got better .  Several days ago, he developed a fever and went to the office and was prescribed Keflex .  He states that he has worsening hematuria and lots of clots that came out .  He states that today his Foley stopped draining and he has severe bladder spasms and leakage around the Foley catheter.     The history is provided by the patient.    Past Medical History:  Diagnosis Date  . Foley catheter in place    placed 07-30-2019  . GERD (gastroesophageal reflux disease)   . History of kidney stones   . Hypertension   . Prostate cancer ([place]) 2015  . Urinary retention     Patient Active Problem List   Diagnosis Date Noted  . Hematuria 08/19/2019  . Malignant neoplasm of prostate ([place]) 07/12/2014    Past Surgical History:  Procedure Laterality Date  . CYSTOSCOPY WITH FULGERATION N/A 08/05/2019   Procedure: CYSTOSCOPY CLOT EVACUATION WITH FULGERATION;  Surgeon: Alexis Frock, MD;  Location: [facility];  Service: Urology;  Laterality: N/A;  . left bicep surgery     reattached  . LYMPHADENECTOMY Bilateral 07/12/2014   Procedure: PELVIC LYMPH NODE DISSECTION;  Surgeon: Alexis Frock, MD;  Location: [facility];  Service: Urology;  Laterality: Bilateral;  . mucous seal  age 54   removed from lip  . ROBOT ASSISTED LAPAROSCOPIC RADICAL PROSTATECTOMY N/A 07/12/2014   Procedure: ROBOTIC  ASSISTED LAPAROSCOPIC RADICAL PROSTATECTOMY , INDOCYANINE GREEN DYE INJECTION;  Surgeon: Alexis Frock, MD;  Location: [facility];  Service: Urology;  Laterality: N/A;  3.5 HRS         Home Medications    Prior to Admission medications   Medication Sig Start Date End Date Taking? Authorizing Provider  lisinopril-hydrochlorothiazide (ZESTORETIC) 10-12.5 MG tablet Take 1 tablet by mouth daily.   Yes [provider]  loratadine (CLARITIN) 10 MG tablet Take 10 mg by mouth daily.   Yes [provider]  LUPRON DEPOT, 53-MONTH, 45 MG injection Inject 45 mg into the skin every 6 (six) months.  07/15/19  Yes [provider]  omeprazole (PRILOSEC OTC) 20 MG tablet Take 20 mg by mouth daily.   Yes [provider]  venlafaxine XR (EFFEXOR-XR) 75 MG 24 hr capsule Take 75 mg by mouth daily with breakfast.   Yes [provider]  senna-docusate (SENOKOT-S) 8.6-50 MG tablet Take 1 tablet by mouth 2 (two) times daily. While taking strong pain meds to prevent constipation. Patient not taking: Reported on 08/19/2019 08/05/19   Alexis Frock, MD  traMADol (ULTRAM) 50 MG tablet Take 1-2 tablets (50-100 mg total) by mouth every 6 (six) hours as needed for moderate pain or severe pain. Post-operatively Patient not taking: Reported on 08/19/2019 08/05/19   Alexis Frock, MD    Family History Family History  Problem Relation Age of Onset  .  Heart disease Mother   . Cancer Father        throat  . Cancer Paternal Grandfather        ? prostate cancer    Social History Social History   Tobacco Use  . Smoking status: Never Smoker  . Smokeless tobacco: Former Systems developer    Types: Chew  Substance Use Topics  . Alcohol use: Yes    Alcohol/week: 2.0 standard drinks    Types: 2 Cans of beer per week    Comment: drinks daily 2 beers or drinks  . Drug use: Yes    Types: Marijuana    Comment: last used marijuana 2 3 weeks ago as of 08-02-2019     Allergies    Tetracyclines & related   Review of Systems Review of Systems  Genitourinary: Positive for difficulty urinating and hematuria.  All other systems reviewed and are negative.    Physical Exam Updated Vital Signs BP (!) 145/76 (BP Location: Left Arm)   Pulse 88   Temp 98 F (36.7 C) (Oral)   Resp 18   Ht 5\' 8"  (1.727 m)   Wt 84.4 kg   SpO2 100%   BMI 28.28 kg/m   Physical Exam Vitals signs and nursing note reviewed.  HENT:     Head: Normocephalic.     Nose: Nose normal.     Mouth/Throat:     Mouth: Mucous membranes are moist.  Eyes:     Extraocular Movements: Extraocular movements intact.     Pupils: Pupils are equal, round, and reactive to light.  Neck:     Musculoskeletal: Normal range of motion.  Cardiovascular:     Rate and Rhythm: Normal rate and regular rhythm.     Pulses: Normal pulses.     Heart sounds: Normal heart sounds.  Pulmonary:     Effort: Pulmonary effort is normal.  Abdominal:     General: Abdomen is flat.  Genitourinary:    Comments: Gross hematuria and foley with clots in it.  Musculoskeletal: Normal range of motion.  Skin:    General: Skin is warm.     Capillary Refill: Capillary refill takes less than 2 seconds.  Neurological:     General: No focal deficit present.     Mental Status: He is alert.  Psychiatric:        Mood and Affect: Mood normal.        Behavior: Behavior normal.      ED Treatments / Results  Labs (all labs ordered are listed, but only abnormal results are displayed) Labs Reviewed  URINALYSIS, ROUTINE W REFLEX MICROSCOPIC - Abnormal; Notable for the following components:      Result Value   Color, Urine RED (*)    APPearance TURBID (*)    Glucose, UA   (*)    Value: TEST NOT REPORTED DUE TO COLOR INTERFERENCE OF URINE PIGMENT   Bilirubin Urine   (*)    Value: TEST NOT REPORTED DUE TO COLOR INTERFERENCE OF URINE PIGMENT   Ketones, ur   (*)    Value: TEST NOT REPORTED DUE TO COLOR INTERFERENCE OF URINE PIGMENT    Protein, ur   (*)    Value: TEST NOT REPORTED DUE TO COLOR INTERFERENCE OF URINE PIGMENT   Nitrite   (*)    Value: TEST NOT REPORTED DUE TO COLOR INTERFERENCE OF URINE PIGMENT   Leukocytes,Ua   (*)    Value: TEST NOT REPORTED DUE TO COLOR INTERFERENCE OF URINE PIGMENT  All other components within normal limits  BASIC METABOLIC PANEL - Abnormal; Notable for the following components:   Sodium 132 (*)    Potassium 3.4 (*)    Glucose, Bld 102 (*)    Calcium 8.5 (*)    All other components within normal limits  CBC - Abnormal; Notable for the following components:   RBC 2.76 (*)    Hemoglobin 9.2 (*)    HCT 26.1 (*)    All other components within normal limits  URINE CULTURE  SARS CORONAVIRUS 2 (TAT 6-24 HRS)  URINALYSIS, MICROSCOPIC (REFLEX)    EKG None  Radiology No results found.  Procedures BLADDER CATHETERIZATION  Date/Time: 08/19/2019 11:44 PM Performed by: Drenda Freeze, MD Authorized by: Drenda Freeze, MD   Consent:    Consent obtained:  Verbal   Consent given by:  Patient   Alternatives discussed:  No treatment Pre-procedure details:    Procedure purpose:  Diagnostic   Preparation: Patient was prepped and draped in usual sterile fashion   Anesthesia (see MAR for exact dosages):    Anesthesia method:  None Procedure details:    Catheter insertion:  Indwelling   Catheter type:  Triple lumen   Catheter size:  22 Fr   Bladder irrigation: yes     Number of attempts:  1   Urine characteristics:  Bloody Post-procedure details:    Patient tolerance of procedure:  Tolerated well, no immediate complications   (including critical care time)  CRITICAL CARE Performed by: Wandra Arthurs   Total critical care time: 30 minutes  Critical care time was exclusive of separately billable procedures and treating other patients.  Critical care was necessary to treat or prevent imminent or life-threatening deterioration.  Critical care was time spent  personally by me on the following activities: development of treatment plan with patient and/or surrogate as well as nursing, discussions with consultants, evaluation of patient's response to treatment, examination of patient, obtaining history from patient or surrogate, ordering and performing treatments and interventions, ordering and review of laboratory studies, ordering and review of radiographic studies, pulse oximetry and re-evaluation of patient's condition.    Medications Ordered in ED Medications  sodium chloride irrigation 0.9 % 3,000 mL (3,000 mLs Irrigation New Bag/Given 08/19/19 2340)  ondansetron (ZOFRAN) injection 4 mg (has no administration in time range)  HYDROcodone-acetaminophen (NORCO/VICODIN) 5-325 MG per tablet 1 tablet (has no administration in time range)  sodium chloride 0.9 % bolus 1,000 mL (0 mLs Intravenous Stopped 08/19/19 2341)     Initial Impression / Assessment and Plan / ED Course  I have reviewed the triage vital signs and the nursing notes.  Pertinent labs & imaging results that were available during my care of the patient were reviewed by me and considered in my medical decision making (see chart for details).        RODENY DEUSCHLE is a 63 y.o. male here with hematuria.  Has history of prostate cancer and had previous radiation.  Had recent cystoscopy as well and still has catheter in place. I tried irrigating the 16 F catheter and large clots came out. Talked to Dr. Rogue Bussing and he recommend replacing with 58 F 3 way catheter.   10:30 pm After I placed 3 way catheter, I performed manual irrigation and some clots came out. There is gross hematuria that never cleared. Given IVF and patient irrigated catheter again and still had hematuria with no clots. Talked to Dr. Matilde Sprang and will start CBI and  will admit and see patient in morning.   Final Clinical Impressions(s) / ED Diagnoses   Final diagnoses:  Gross hematuria    ED Discharge Orders     None       Drenda Freeze, MD 08/19/19 [phone]

## 2019-08-20 ENCOUNTER — Observation Stay (HOSPITAL_COMMUNITY): Payer: Federal, State, Local not specified - PPO

## 2019-08-20 DIAGNOSIS — N3041 Irradiation cystitis with hematuria: Secondary | ICD-10-CM | POA: Diagnosis present

## 2019-08-20 DIAGNOSIS — Z79899 Other long term (current) drug therapy: Secondary | ICD-10-CM | POA: Diagnosis not present

## 2019-08-20 DIAGNOSIS — R339 Retention of urine, unspecified: Secondary | ICD-10-CM | POA: Diagnosis present

## 2019-08-20 DIAGNOSIS — Z881 Allergy status to other antibiotic agents status: Secondary | ICD-10-CM | POA: Diagnosis not present

## 2019-08-20 DIAGNOSIS — K219 Gastro-esophageal reflux disease without esophagitis: Secondary | ICD-10-CM | POA: Diagnosis present

## 2019-08-20 DIAGNOSIS — I10 Essential (primary) hypertension: Secondary | ICD-10-CM | POA: Diagnosis present

## 2019-08-20 DIAGNOSIS — N3289 Other specified disorders of bladder: Secondary | ICD-10-CM | POA: Diagnosis present

## 2019-08-20 DIAGNOSIS — Z8249 Family history of ischemic heart disease and other diseases of the circulatory system: Secondary | ICD-10-CM | POA: Diagnosis not present

## 2019-08-20 DIAGNOSIS — Z20828 Contact with and (suspected) exposure to other viral communicable diseases: Secondary | ICD-10-CM | POA: Diagnosis present

## 2019-08-20 DIAGNOSIS — Z87891 Personal history of nicotine dependence: Secondary | ICD-10-CM | POA: Diagnosis not present

## 2019-08-20 DIAGNOSIS — T83031A Leakage of indwelling urethral catheter, initial encounter: Secondary | ICD-10-CM | POA: Diagnosis present

## 2019-08-20 DIAGNOSIS — Z87442 Personal history of urinary calculi: Secondary | ICD-10-CM | POA: Diagnosis not present

## 2019-08-20 DIAGNOSIS — Z8546 Personal history of malignant neoplasm of prostate: Secondary | ICD-10-CM | POA: Diagnosis not present

## 2019-08-20 DIAGNOSIS — Y731 Therapeutic (nonsurgical) and rehabilitative gastroenterology and urology devices associated with adverse incidents: Secondary | ICD-10-CM | POA: Diagnosis present

## 2019-08-20 DIAGNOSIS — R310 Gross hematuria: Secondary | ICD-10-CM | POA: Diagnosis present

## 2019-08-20 DIAGNOSIS — Y842 Radiological procedure and radiotherapy as the cause of abnormal reaction of the patient, or of later complication, without mention of misadventure at the time of the procedure: Secondary | ICD-10-CM | POA: Diagnosis present

## 2019-08-20 DIAGNOSIS — Z9079 Acquired absence of other genital organ(s): Secondary | ICD-10-CM | POA: Diagnosis not present

## 2019-08-20 LAB — BASIC METABOLIC PANEL
Anion gap: 9 (ref 5–15)
BUN: 8 mg/dL (ref 6–20)
CO2: 25 mmol/L (ref 22–32)
Calcium: 8.3 mg/dL — ABNORMAL LOW (ref 8.9–10.3)
Chloride: 104 mmol/L (ref 98–111)
Creatinine, Ser: 0.8 mg/dL (ref 0.61–1.24)
GFR calc Af Amer: >60 mL/min (ref >60)
GFR calc non Af Amer: >60 mL/min (ref >60)
Glucose, Bld: 119 mg/dL — ABNORMAL HIGH (ref 70–99)
Potassium: 3.9 mmol/L (ref 3.5–5.1)
Sodium: 138 mmol/L (ref 135–145)

## 2019-08-20 LAB — CBC
HCT: 25.4 % — ABNORMAL LOW (ref 39.0–52.0)
Hemoglobin: 8.9 g/dL — ABNORMAL LOW (ref 13.0–17.0)
MCH: 33.7 pg (ref 26.0–34.0)
MCHC: 35 g/dL (ref 30.0–36.0)
MCV: 96.2 fL (ref 80.0–100.0)
Platelets: 254 10*3/uL (ref 150–400)
RBC: 2.64 MIL/uL — ABNORMAL LOW (ref 4.22–5.81)
RDW: 12 % (ref 11.5–15.5)
WBC: 8.3 10*3/uL (ref 4.0–10.5)
nRBC: 0 % (ref 0.0–0.2)

## 2019-08-20 LAB — HEMOGLOBIN AND HEMATOCRIT, BLOOD
HCT: 25.4 % — ABNORMAL LOW (ref 39.0–52.0)
Hemoglobin: 8.7 g/dL — ABNORMAL LOW (ref 13.0–17.0)

## 2019-08-20 LAB — SARS CORONAVIRUS 2 (TAT 6-24 HRS): SARS Coronavirus 2: NEGATIVE

## 2019-08-20 MED ORDER — SCOPOLAMINE 1 MG/3DAYS TD PT72
1.0000 | MEDICATED_PATCH | TRANSDERMAL | Status: DC
  Filled 2019-08-20: qty 1

## 2019-08-20 MED ORDER — SODIUM CHLORIDE 0.9 % IV SOLN
INTRAVENOUS | Status: DC
  Administered 2019-08-21 (×3): via INTRAVENOUS

## 2019-08-20 MED ORDER — CHLORHEXIDINE GLUCONATE CLOTH 2 % EX PADS
6.0000 | MEDICATED_PAD | Freq: Every day | CUTANEOUS | Status: DC
  Administered 2019-08-20 – 2019-08-21 (×2): 6 via TOPICAL

## 2019-08-20 NOTE — H&P (Signed)
Urology Consult  Referring physician: Shirlyn Goltz Reason for referral: Hematuria  Chief Complaint:  hematuria  History of Present Illness:  Patient well known to Urology Dr. Tresa Moore  With history of prostate cancer.  He has had a prostatectomy  With local recurrence and radiation.  He has had issues with radiation cystitis.  October 2nd he had a mild-to-moderate amount of clot removed from bladder and an erythematous area fulgurated.  He has been seen by Urology recently for recurrent hematuria and was given an antibiotic and is culture was negative.  He developed clot retention and came the emergency room.  The emergency room team did a very good job in inserting 3 way catheter and irrigated him.  They are very comfortable there was no clots but is still quite red so we admitted him with continuous bladder irrigation  Past Medical History:  Diagnosis Date  . Foley catheter in place    placed 07-30-2019  . GERD (gastroesophageal reflux disease)   . History of kidney stones   . Hypertension   . Prostate cancer ([place]) 2015  . Urinary retention    Past Surgical History:  Procedure Laterality Date  . CYSTOSCOPY WITH FULGERATION N/A 08/05/2019   Procedure: CYSTOSCOPY CLOT EVACUATION WITH FULGERATION;  Surgeon: Alexis Frock, MD;  Location: [facility];  Service: Urology;  Laterality: N/A;  . left bicep surgery     reattached  . LYMPHADENECTOMY Bilateral 07/12/2014   Procedure: PELVIC LYMPH NODE DISSECTION;  Surgeon: Alexis Frock, MD;  Location: [facility];  Service: Urology;  Laterality: Bilateral;  . mucous seal  age 63   removed from lip  . ROBOT ASSISTED LAPAROSCOPIC RADICAL PROSTATECTOMY N/A 07/12/2014   Procedure: ROBOTIC ASSISTED LAPAROSCOPIC RADICAL PROSTATECTOMY , INDOCYANINE GREEN DYE INJECTION;  Surgeon: Alexis Frock, MD;  Location: [facility];  Service: Urology;  Laterality: N/A;  3.5 HRS     Medications: I have reviewed the patient's current medications. Allergies:   Allergies  Allergen Reactions  . Tetracyclines & Related Rash    Family History  Problem Relation Age of Onset  . Heart disease Mother   . Cancer Father        throat  . Cancer Paternal Grandfather        ? prostate cancer   Social History:  reports that he has never smoked. He quit smokeless tobacco use about 25 years ago.  His smokeless tobacco use included chew. He reports current alcohol use of about 2.0 standard drinks of alcohol per week. He reports current drug use. Drug: Marijuana.  ROS: All systems are reviewed and negative except as noted.  wrist negative  Physical Exam:  Vital signs in last 24 hours: Temp:  [98 F (36.7 C)-98.7 F (37.1 C)] 98.7 F (37.1 C) (10/17 0514) Pulse Rate:  [70-88] 71 (10/17 0514) Resp:  [16-18] 16 (10/17 0514) BP: (122-145)/(66-94) 132/76 (10/17 0514) SpO2:  [99 %-100 %] 99 % (10/17 0514) Weight:  [84.4 kg] 84.4 kg (10/16 1949)  Cardiovascular: Skin warm; not flushed Respiratory: Breaths quiet; no shortness of breath Abdomen: No masses Neurological: Normal sensation to touch Musculoskeletal: Normal motor function arms and legs Lymphatics: No inguinal adenopathy Skin: No rashes Genitourinaryurine almost clear and male genitalia normal  Laboratory Data:  Results for orders placed or performed during the hospital encounter of 08/19/19 (from the past 72 hour(s))  Urinalysis, Routine w reflex microscopic- may I&O cath if menses     Status: Abnormal   Collection Time: 08/19/19  9:00 PM  Result Value Ref Range   Color, Urine RED (A) YELLOW    Comment: BIOCHEMICALS MAY BE AFFECTED BY COLOR   APPearance TURBID (A) CLEAR   Specific Gravity, Urine  1.005 - 1.030    TEST NOT REPORTED DUE TO COLOR INTERFERENCE OF URINE PIGMENT   pH  5.0 - 8.0    TEST NOT REPORTED DUE TO COLOR INTERFERENCE OF URINE PIGMENT   Glucose, UA (A) NEGATIVE mg/dL    TEST NOT REPORTED DUE TO COLOR INTERFERENCE OF URINE PIGMENT   Hgb urine dipstick NEGATIVE  NEGATIVE   Bilirubin Urine (A) NEGATIVE    TEST NOT REPORTED DUE TO COLOR INTERFERENCE OF URINE PIGMENT   Ketones, ur (A) NEGATIVE mg/dL    TEST NOT REPORTED DUE TO COLOR INTERFERENCE OF URINE PIGMENT   Protein, ur (A) NEGATIVE mg/dL    TEST NOT REPORTED DUE TO COLOR INTERFERENCE OF URINE PIGMENT   Nitrite (A) NEGATIVE    TEST NOT REPORTED DUE TO COLOR INTERFERENCE OF URINE PIGMENT   Leukocytes,Ua (A) NEGATIVE    TEST NOT REPORTED DUE TO COLOR INTERFERENCE OF URINE PIGMENT    Comment: Performed at [facility], [place] [address] [place], [place] [address]  Urinalysis, Microscopic (reflex)     Status: None   Collection Time: 08/19/19  9:00 PM  Result Value Ref Range   RBC / HPF >50 0 - 5 RBC/hpf   WBC, UA 6-10 0 - 5 WBC/hpf   Bacteria, UA NONE SEEN NONE SEEN   Squamous Epithelial / LPF NONE SEEN 0 - 5   Urine-Other MICROSCOPIC EXAM PERFORMED ON UNCONCENTRATED URINE     Comment: Performed at [facility], [place] [address], [place], [place] [id number]  Basic metabolic panel     Status: Abnormal   Collection Time: 08/19/19  9:53 PM  Result Value Ref Range   Sodium 132 (L) 135 - 145 mmol/L   Potassium 3.4 (L) 3.5 - 5.1 mmol/L   Chloride 99 98 - 111 mmol/L   CO2 22 22 - 32 mmol/L   Glucose, Bld 102 (H) 70 - 99 mg/dL   BUN 11 6 - 20 mg/dL   Creatinine, Ser 0.87 0.61 - 1.24 mg/dL   Calcium 8.5 (L) 8.9 - 10.3 mg/dL   GFR calc non Af Amer >60 >60 mL/min   GFR calc Af Amer >60 >60 mL/min   Anion gap 11 5 - 15    Comment: Performed at [facility], [place] [address], [place], [place] [address]  CBC     Status: Abnormal   Collection Time: 08/19/19  9:53 PM  Result Value Ref Range   WBC 9.6 4.0 - 10.5 K/uL   RBC 2.76 (L) 4.22 - 5.81 MIL/uL   Hemoglobin 9.2 (L) 13.0 - 17.0 g/dL   HCT 26.1 (L) 39.0 - 52.0 %   MCV 94.6 80.0 - 100.0 fL   MCH 33.3 26.0 - 34.0 pg   MCHC 35.2 30.0 - 36.0 g/dL   RDW 11.9 11.5 - 15.5 %   Platelets 271 150 - 400  K/uL   nRBC 0.0 0.0 - 0.2 %    Comment: Performed at [facility], [place] [address], [place], [place] [address]   No results found for this or any previous visit (from the past 240 hour(s)). Creatinine: Recent Labs    08/19/19 2153  CREATININE 0.87    Xrays: See report/chart  none  Impression/Assessment:   patient has recurrent gross hematuria.  He has a difficult problem.  His urologist is post about hyperbaric oxygen perhaps  Plan:  Manage with CBI; ? TOV in am  Nereyda Bowler A Jarrad Mclees 08/20/2019, 6:45 AM

## 2019-08-20 NOTE — Progress Notes (Signed)
Attending paged to inform pt had arrived to unit per order. Awaiting response.

## 2019-08-20 NOTE — Progress Notes (Signed)
Patient complains of pressure and pain in bladder. I flushed catheter and pulled out multiple blood clots and repeated this process several times. Patient stated pain was better. On call MD was notified.

## 2019-08-20 NOTE — Progress Notes (Signed)
Noticed that patient had showed up on her rounding list at [facility].  It appears that attending was erroneously listed as Dr. Wendy Poet with the family medicine teaching service.  I have called the patient's nurse and informed her that we are not working at [place] long and this is an error.  We are not rounding on this patient or managing their care.  I have suggested that the check with the emergency department to see who they intended to admit this patient to because it is likely a clerical error in the computer.  Dr. Criss Rosales

## 2019-08-20 NOTE — ED Notes (Signed)
ED TO INPATIENT HANDOFF REPORT  Name/Age/Gender [name] [name] 63 y.o. male  Code Status Code Status History    Date Active Date Inactive Code Status Order ID Comments User Context   07/12/2014 1732 07/13/2014 2002 Full Code [id number]  Debbrah Alar, PA-C Inpatient   Advance Care Planning Activity    Advance Directive Documentation     Most Recent Value  Type of Advance Directive  Healthcare Power of Attorney, Living will  Pre-existing out of facility DNR order (yellow form or pink MOST form)  -  "MOST" Form in Place?  -      Home/SNF/Other Home  Chief Complaint cancer pt; urinary retention  Level of Care/Admitting Diagnosis ED Disposition    ED Disposition Condition [place]: [place] [100102]  Level of Care: Med-Surg [16]  Covid Evaluation: Asymptomatic Screening Protocol (No Symptoms)  Diagnosis: Hematuria [id number]  Admitting Physician: Wendy Poet, TODD D Stacy.Beals  Attending Physician: MCDIARMID, TODD D [[id number]]  PT Class (Do Not Modify): Observation [104]  PT Acc Code (Do Not Modify): Observation [10022]       Medical History Past Medical History:  Diagnosis Date  . Foley catheter in place    placed 07-30-2019  . GERD (gastroesophageal reflux disease)   . History of kidney stones   . Hypertension   . Prostate cancer ([place]) 2015  . Urinary retention     Allergies Allergies  Allergen Reactions  . Tetracyclines & Related Rash    IV Location/Drains/Wounds Patient Lines/Drains/Airways Status   Active Line/Drains/Airways    Name:   Placement date:   Placement time:   Site:   Days:   Peripheral IV 08/19/19 Right Antecubital   08/19/19    2147    Antecubital   1   Urethral Catheter Dylan Rosser Straight-tip 16 Fr.   08/19/19    2004    Straight-tip   1   Incision (Closed) 07/12/14 Abdomen Other (Comment)   07/12/14    1600     1865   Incision (Closed) 07/12/14 Perineum Other (Comment)   07/12/14    1600     1865   Incision (Closed) 08/05/19 Perineum   08/05/19    0800     15   Incision - 6 Ports Abdomen 1: Left;Lateral;Lower 2: Left;Lateral;Upper 3: Umbilicus;Superior 4: Right;Lateral;Lower 5: Right;Medial;Upper 6: Right;Lateral;Upper   07/12/14    1324     1865          Labs/Imaging Results for orders placed or performed during the hospital encounter of 08/19/19 (from the past 48 hour(s))  Urinalysis, Routine w reflex microscopic- may I&O cath if menses     Status: Abnormal   Collection Time: 08/19/19  9:00 PM  Result Value Ref Range   Color, Urine RED (A) YELLOW    Comment: BIOCHEMICALS MAY BE AFFECTED BY COLOR   APPearance TURBID (A) CLEAR   Specific Gravity, Urine  1.005 - 1.030    TEST NOT REPORTED DUE TO COLOR INTERFERENCE OF URINE PIGMENT   pH  5.0 - 8.0    TEST NOT REPORTED DUE TO COLOR INTERFERENCE OF URINE PIGMENT   Glucose, UA (A) NEGATIVE mg/dL    TEST NOT REPORTED DUE TO COLOR INTERFERENCE OF URINE PIGMENT   Hgb urine dipstick NEGATIVE NEGATIVE   Bilirubin Urine (A) NEGATIVE    TEST NOT REPORTED DUE TO COLOR INTERFERENCE OF URINE PIGMENT   Ketones, ur (A) NEGATIVE mg/dL    TEST NOT REPORTED  DUE TO COLOR INTERFERENCE OF URINE PIGMENT   Protein, ur (A) NEGATIVE mg/dL    TEST NOT REPORTED DUE TO COLOR INTERFERENCE OF URINE PIGMENT   Nitrite (A) NEGATIVE    TEST NOT REPORTED DUE TO COLOR INTERFERENCE OF URINE PIGMENT   Leukocytes,Ua (A) NEGATIVE    TEST NOT REPORTED DUE TO COLOR INTERFERENCE OF URINE PIGMENT    Comment: Performed at [facility], [place] [address], [place], [place] [address]  Urinalysis, Microscopic (reflex)     Status: None   Collection Time: 08/19/19  9:00 PM  Result Value Ref Range   RBC / HPF >50 0 - 5 RBC/hpf   WBC, UA 6-10 0 - 5 WBC/hpf   Bacteria, UA NONE SEEN NONE SEEN   Squamous Epithelial / LPF NONE SEEN 0 - 5   Urine-Other MICROSCOPIC EXAM PERFORMED ON UNCONCENTRATED URINE     Comment: Performed at [facility],  [place] [address] [place], [place] [id number]  Basic metabolic panel     Status: Abnormal   Collection Time: 08/19/19  9:53 PM  Result Value Ref Range   Sodium 132 (L) 135 - 145 mmol/L   Potassium 3.4 (L) 3.5 - 5.1 mmol/L   Chloride 99 98 - 111 mmol/L   CO2 22 22 - 32 mmol/L   Glucose, Bld 102 (H) 70 - 99 mg/dL   BUN 11 6 - 20 mg/dL   Creatinine, Ser 0.87 0.61 - 1.24 mg/dL   Calcium 8.5 (L) 8.9 - 10.3 mg/dL   GFR calc non Af Amer >60 >60 mL/min   GFR calc Af Amer >60 >60 mL/min   Anion gap 11 5 - 15    Comment: Performed at [facility], [place] [address] [place], [place] [address]  CBC     Status: Abnormal   Collection Time: 08/19/19  9:53 PM  Result Value Ref Range   WBC 9.6 4.0 - 10.5 K/uL   RBC 2.76 (L) 4.22 - 5.81 MIL/uL   Hemoglobin 9.2 (L) 13.0 - 17.0 g/dL   HCT 26.1 (L) 39.0 - 52.0 %   MCV 94.6 80.0 - 100.0 fL   MCH 33.3 26.0 - 34.0 pg   MCHC 35.2 30.0 - 36.0 g/dL   RDW 11.9 11.5 - 15.5 %   Platelets 271 150 - 400 K/uL   nRBC 0.0 0.0 - 0.2 %    Comment: Performed at [facility], [place] [address] [place], [place] [address]   No results found.  Pending Labs Unresulted Labs (From admission, onward)    Start     Ordered   08/19/19 2243  SARS CORONAVIRUS 2 (TAT 6-24 HRS) Nasopharyngeal Nasopharyngeal Swab  (Asymptomatic/Tier 2 Patients Labs)  Once,   STAT    Question Answer Comment  Is this test for diagnosis or screening Screening   Symptomatic for COVID-19 as defined by CDC No   Hospitalized for COVID-19 No   Admitted to ICU for COVID-19 No   Previously tested for COVID-19 Yes   Resident in a congregate (group) care setting No   Employed in healthcare setting No      08/19/19 2242   08/19/19 1949  Urine C&S  Once,   STAT     08/19/19 1948          Vitals/Pain Today's Vitals   08/20/19 0019 08/20/19 0228 08/20/19 0230 08/20/19 0232  BP: 123/69 122/66 (!) 140/94   Pulse: 74 70 70   Resp: 18  16   Temp:      TempSrc:       SpO2: 99% 99% 100%   Weight:      Height:      PainSc:    5     Isolation Precautions No active isolations  Medications Medications  sodium chloride irrigation 0.9 % 3,000 mL (3,000 mLs Irrigation New Bag/Given 08/19/19 2340)  ondansetron (ZOFRAN) injection 4 mg (4 mg Intravenous Given 08/20/19 0016)  HYDROcodone-acetaminophen (NORCO/VICODIN) 5-325 MG per tablet 1 tablet (1 tablet Oral Given 08/20/19 0015)  sodium chloride 0.9 % bolus 1,000 mL (0 mLs Intravenous Stopped 08/19/19 2341)    Mobility walks

## 2019-08-20 NOTE — ED Notes (Signed)
CBI running to a color of pale pink no clots noted

## 2019-08-20 NOTE — Progress Notes (Signed)
As I was making evening rounds nurse happened tip age that the CBI was not draining well and patient had some bladder pain.  Urine was red in the tubing.    Procedure: I tried to irrigate the 22 [place] three-way catheter but it would not irrigate.  The bladder was moderately distended.  I recommended the  Patient change 2 22 French 3 way hematuria catheter and he elected to proceed.  The balloon was deflated on the 20/2 [place] 3 way and it was removed.  There were some clots in the tip.  He was prepped and draped in the usual sterile fashion.  A 22 French 3 way hematuria catheter was placed with 10 cc in the balloon and seated at the bladder neck.  I then irrigated what appeared to be old clot.  I irrigated him to clear and was very light pink.  He was placed back on CBI.  His abdomen was soft and nontender , with a soft bladder.  Had equal return of the irrigation.    Assessment: Gross hematuria likely from hemorrhagic cystitis- does not appear to be active bleeding.    Plan:   - serial H&H  - NPO after midnight in case he needs OR tomorrow  - wean CBI and void trial tomorrow if clear

## 2019-08-21 ENCOUNTER — Inpatient Hospital Stay (HOSPITAL_COMMUNITY): Payer: Federal, State, Local not specified - PPO | Admitting: Anesthesiology

## 2019-08-21 ENCOUNTER — Encounter (HOSPITAL_COMMUNITY): Payer: Self-pay | Admitting: *Deleted

## 2019-08-21 ENCOUNTER — Encounter (HOSPITAL_COMMUNITY)
Admission: EM | Disposition: A | Discharge status: Home / Self Care | Payer: Self-pay | Source: Home / Self Care | Attending: Urology

## 2019-08-21 HISTORY — PX: CYSTOSCOPY: SHX5120

## 2019-08-21 LAB — URINE CULTURE: Culture: NO GROWTH

## 2019-08-21 LAB — BASIC METABOLIC PANEL
Anion gap: 8 (ref 5–15)
BUN: 8 mg/dL (ref 6–20)
CO2: 24 mmol/L (ref 22–32)
Calcium: 7.8 mg/dL — ABNORMAL LOW (ref 8.9–10.3)
Chloride: 103 mmol/L (ref 98–111)
Creatinine, Ser: 0.88 mg/dL (ref 0.61–1.24)
GFR calc Af Amer: >60 mL/min (ref >60)
GFR calc non Af Amer: >60 mL/min (ref >60)
Glucose, Bld: 112 mg/dL — ABNORMAL HIGH (ref 70–99)
Potassium: 3.8 mmol/L (ref 3.5–5.1)
Sodium: 135 mmol/L (ref 135–145)

## 2019-08-21 LAB — HEMOGLOBIN AND HEMATOCRIT, BLOOD
HCT: 24.2 % — ABNORMAL LOW (ref 39.0–52.0)
Hemoglobin: 8 g/dL — ABNORMAL LOW (ref 13.0–17.0)

## 2019-08-21 LAB — SURGICAL PCR SCREEN
MRSA, PCR: NEGATIVE
Staphylococcus aureus: NEGATIVE

## 2019-08-21 SURGERY — CYSTOSCOPY
Anesthesia: General | Site: Bladder

## 2019-08-21 MED ORDER — SODIUM CHLORIDE 0.9 % IV SOLN
1.0000 g | INTRAVENOUS | Status: DC
  Administered 2019-08-21: 1 g via INTRAVENOUS
  Filled 2019-08-21: qty 1

## 2019-08-21 MED ORDER — HYDROMORPHONE HCL 1 MG/ML IJ SOLN: 0.2500 mg | INTRAMUSCULAR | Status: DC | PRN

## 2019-08-21 MED ORDER — MIDAZOLAM HCL 5 MG/5ML IJ SOLN
INTRAMUSCULAR | Status: DC | PRN
  Administered 2019-08-21: 2 mg via INTRAVENOUS

## 2019-08-21 MED ORDER — LIDOCAINE 2% (20 MG/ML) 5 ML SYRINGE
INTRAMUSCULAR | Status: DC | PRN
  Administered 2019-08-21: 75 mg via INTRAVENOUS

## 2019-08-21 MED ORDER — LIDOCAINE HCL URETHRAL/MUCOSAL 2 % EX GEL
CUTANEOUS | Status: AC
  Filled 2019-08-21: qty 5

## 2019-08-21 MED ORDER — LIDOCAINE HCL URETHRAL/MUCOSAL 2 % EX GEL
CUTANEOUS | Status: DC | PRN
  Administered 2019-08-21: 1

## 2019-08-21 MED ORDER — PROPOFOL 10 MG/ML IV BOLUS
INTRAVENOUS | Status: AC
  Filled 2019-08-21: qty 20

## 2019-08-21 MED ORDER — FENTANYL CITRATE (PF) 100 MCG/2ML IJ SOLN
INTRAMUSCULAR | Status: DC | PRN
  Administered 2019-08-21 (×4): 50 ug via INTRAVENOUS

## 2019-08-21 MED ORDER — CEPHALEXIN 500 MG PO CAPS: 500.0000 mg | ORAL_CAPSULE | Freq: Every day | ORAL | 0 refills | Status: AC

## 2019-08-21 MED ORDER — STERILE WATER FOR IRRIGATION IR SOLN
Status: DC | PRN
  Administered 2019-08-21: 3000 mL

## 2019-08-21 MED ORDER — PROMETHAZINE HCL 25 MG/ML IJ SOLN: 6.2500 mg | INTRAMUSCULAR | Status: DC | PRN

## 2019-08-21 MED ORDER — OXYCODONE HCL 5 MG PO TABS: 5.0000 mg | ORAL_TABLET | Freq: Once | ORAL | Status: DC | PRN

## 2019-08-21 MED ORDER — SODIUM CHLORIDE 0.9 % IR SOLN
Status: DC | PRN
  Administered 2019-08-21: 6000 mL

## 2019-08-21 MED ORDER — FENTANYL CITRATE (PF) 100 MCG/2ML IJ SOLN
INTRAMUSCULAR | Status: AC
  Filled 2019-08-21: qty 2

## 2019-08-21 MED ORDER — LIDOCAINE 2% (20 MG/ML) 5 ML SYRINGE
INTRAMUSCULAR | Status: AC
  Filled 2019-08-21: qty 5

## 2019-08-21 MED ORDER — MIDAZOLAM HCL 2 MG/2ML IJ SOLN
INTRAMUSCULAR | Status: AC
  Filled 2019-08-21: qty 2

## 2019-08-21 MED ORDER — OXYCODONE HCL 5 MG/5ML PO SOLN: 5.0000 mg | Freq: Once | ORAL | Status: DC | PRN

## 2019-08-21 MED ORDER — ONDANSETRON HCL 4 MG/2ML IJ SOLN
INTRAMUSCULAR | Status: DC | PRN
  Administered 2019-08-21: 4 mg via INTRAVENOUS

## 2019-08-21 MED ORDER — PROPOFOL 10 MG/ML IV BOLUS
INTRAVENOUS | Status: DC | PRN
  Administered 2019-08-21: 200 mg via INTRAVENOUS

## 2019-08-21 SURGICAL SUPPLY — 16 items
BAG URO CATCHER STRL LF (MISCELLANEOUS) ×3 IMPLANT
BASKET ZERO TIP NITINOL 2.4FR (BASKET) IMPLANT
BSKT STON RTRVL ZERO TP 2.4FR (BASKET)
CATH FOLEY 2WAY SLVR  5CC 18FR (CATHETERS) ×2
CATH FOLEY 2WAY SLVR 5CC 18FR (CATHETERS) IMPLANT
CATH INTERMIT  6FR 70CM (CATHETERS) IMPLANT
CLOTH BEACON ORANGE TIMEOUT ST (SAFETY) ×3 IMPLANT
GLOVE BIOGEL M STRL SZ7.5 (GLOVE) ×3 IMPLANT
GOWN STRL REUS W/TWL XL LVL3 (GOWN DISPOSABLE) ×3 IMPLANT
GUIDEWIRE ANG ZIPWIRE 038X150 (WIRE) IMPLANT
GUIDEWIRE STR DUAL SENSOR (WIRE) ×3 IMPLANT
KIT TURNOVER KIT A (KITS) IMPLANT
MANIFOLD NEPTUNE II (INSTRUMENTS) ×3 IMPLANT
PACK CYSTO (CUSTOM PROCEDURE TRAY) ×3 IMPLANT
TUBING CONNECTING 10 (TUBING) ×2 IMPLANT
TUBING CONNECTING 10' (TUBING) ×1

## 2019-08-21 NOTE — Discharge Instructions (Signed)
Indwelling Urinary Catheter Care, Adult An indwelling urinary catheter is a thin tube that is put into your bladder. The tube helps to drain pee (urine) out of your body. The tube goes in through your urethra. Your urethra is where pee comes out of your body. Your pee will come out through the catheter, then it will go into a bag (drainage bag). Take good care of your catheter so it will work well.  Removal of foley - you can take foley out at home Wednesday AM (as instructed, deflate balloon) or call office to come in for foley removal in next 3-5 day.   How to wear your catheter and bag Supplies needed  Sticky tape (adhesive tape) or a leg strap.  Alcohol wipe or soap and water (if you use tape).  A clean towel (if you use tape).  Large overnight bag.  Smaller bag (leg bag). Wearing your catheter Attach your catheter to your leg with tape or a leg strap.  Make sure the catheter is not pulled tight.  If a leg strap gets wet, take it off and put on a dry strap.  If you use tape to hold the bag on your leg: 1. Use an alcohol wipe or soap and water to wash your skin where the tape made it sticky before. 2. Use a clean towel to pat-dry that skin. 3. Use new tape to make the bag stay on your leg. Wearing your bags You should have been given a large overnight bag.  You may wear the overnight bag in the day or night.  Always have the overnight bag lower than your bladder.  Do not let the bag touch the floor.  Before you go to sleep, put a clean plastic bag in a wastebasket. Then hang the overnight bag inside the wastebasket. You should also have a smaller leg bag that fits under your clothes.  Always wear the leg bag below your knee.  Do not wear your leg bag at night. How to care for your skin and catheter Supplies needed  A clean washcloth.  Water and mild soap.  A clean towel. Caring for your skin and catheter      Clean the skin around your catheter every  day: ? Wash your hands with soap and water. ? Wet a clean washcloth in warm water and mild soap. ? Clean the skin around your urethra. ? If you are male: ? Gently spread the folds of skin around your vagina (labia). ? With the washcloth in your other hand, wipe the inner side of your labia on each side. Wipe from front to back. ? If you are male: ? Pull back any skin that covers the end of your penis (foreskin). ? With the washcloth in your other hand, wipe your penis in small circles. Start wiping at the tip of your penis, then move away from the catheter. ? Move the foreskin back in place, if needed. ? With your free hand, hold the catheter close to where it goes into your body. ? Keep holding the catheter during cleaning so it does not get pulled out. ? With the washcloth in your other hand, clean the catheter. ? Only wipe downward on the catheter. ? Do not wipe upward toward your body. Doing this may push germs into your urethra and cause infection. ? Use a clean towel to pat-dry the catheter and the skin around it. Make sure to wipe off all soap. ? Wash your hands with soap  and water.  Shower every day. Do not take baths.  Do not use cream, ointment, or lotion on the area where the catheter goes into your body, unless your doctor tells you to.  Do not use powders, sprays, or lotions on your genital area.  Check your skin around the catheter every day for signs of infection. Check for: ? Redness, swelling, or pain. ? Fluid or blood. ? Warmth. ? Pus or a bad smell. How to empty the bag Supplies needed  Rubbing alcohol.  Gauze pad or cotton ball.  Tape or a leg strap. Emptying the bag Pour the pee out of your bag when it is ?- full, or at least 2-3 times a day. Do this for your overnight bag and your leg bag. 1. Wash your hands with soap and water. 2. Separate (detach) the bag from your leg. 3. Hold the bag over the toilet or a clean pail. Keep the bag lower than your  hips and bladder. This is so the pee (urine) does not go back into the tube. 4. Open the pour spout. It is at the bottom of the bag. 5. Empty the pee into the toilet or pail. Do not let the pour spout touch any surface. 6. Put rubbing alcohol on a gauze pad or cotton ball. 7. Use the gauze pad or cotton ball to clean the pour spout. 8. Close the pour spout. 9. Attach the bag to your leg with tape or a leg strap. 10. Wash your hands with soap and water. Follow instructions for cleaning the drainage bag:  From the product maker.  As told by your doctor. How to change the bag Supplies needed  Alcohol wipes.  A clean bag.  Tape or a leg strap. Changing the bag Replace your bag when it starts to leak, smell bad, or look dirty. 1. Wash your hands with soap and water. 2. Separate the dirty bag from your leg. 3. Pinch the catheter with your fingers so that pee does not spill out. 4. Separate the catheter tube from the bag tube where these tubes connect (at the connection valve). Do not let the tubes touch any surface. 5. Clean the end of the catheter tube with an alcohol wipe. Use a different alcohol wipe to clean the end of the bag tube. 6. Connect the catheter tube to the tube of the clean bag. 7. Attach the clean bag to your leg with tape or a leg strap. Do not make the bag tight on your leg. 8. Wash your hands with soap and water. General rules   Never pull on your catheter. Never try to take it out. Doing that can hurt you.  Always wash your hands before and after you touch your catheter or bag. Use a mild, fragrance-free soap. If you do not have soap and water, use hand sanitizer.  Always make sure there are no twists or bends (kinks) in the catheter tube.  Always make sure there are no leaks in the catheter or bag.  Drink enough fluid to keep your pee pale yellow.  Do not take baths, swim, or use a hot tub.  If you are male, wipe from front to back after you poop (have  a bowel movement). Contact a doctor if:  Your pee is cloudy.  Your pee smells worse than usual.  Your catheter gets clogged.  Your catheter leaks.  Your bladder feels full. Get help right away if:  You have redness, swelling, or pain where  the catheter goes into your body.  You have fluid, blood, pus, or a bad smell coming from the area where the catheter goes into your body.  Your skin feels warm where the catheter goes into your body.  You have a fever.  You have pain in your: ? Belly (abdomen). ? Legs. ? Lower back. ? Bladder.  You see blood in the catheter.  Your pee is pink or red.  You feel sick to your stomach (nauseous).  You throw up (vomit).  You have chills.  Your pee is not draining into the bag.  Your catheter gets pulled out. Summary  An indwelling urinary catheter is a thin tube that is placed into the bladder to help drain pee (urine) out of the body.  The catheter is placed into the part of the body that drains pee from the bladder (urethra).  Taking good care of your catheter will keep it working properly and help prevent problems.  Always wash your hands before and after touching your catheter or bag.  Never pull on your catheter or try to take it out. This information is not intended to replace advice given to you by your health care provider. Make sure you discuss any questions you have with your health care provider. Document Released: 02/14/2013 Document Revised: 02/11/2019 Document Reviewed: 06/05/2017 Elsevier Patient Education  2020 [employer].

## 2019-08-21 NOTE — Anesthesia Preprocedure Evaluation (Signed)
Anesthesia Evaluation  Patient identified by MRN, date of birth, ID band Patient awake    Reviewed: Allergy & Precautions, H&P , NPO status , Patient's Chart, lab work & pertinent test results  Airway Mallampati: II  TM Distance: <3 FB Neck ROM: Full    Dental no notable dental hx. (+) Teeth Intact, Dental Advisory Given   Pulmonary neg pulmonary ROS,    Pulmonary exam normal breath sounds clear to auscultation       Cardiovascular hypertension, Pt. on medications negative cardio ROS Normal cardiovascular exam Rhythm:Regular Rate:Normal     Neuro/Psych negative neurological ROS  negative psych ROS   GI/Hepatic Neg liver ROS, GERD  Medicated,  Endo/Other  negative endocrine ROS  Renal/GU negative Renal ROS   S/P Radical Prostatectomy-robotic 2015 negative genitourinary   Musculoskeletal negative musculoskeletal ROS (+)   Abdominal   Peds negative pediatric ROS (+)  Hematology negative hematology ROS (+)   Anesthesia Other Findings   Reproductive/Obstetrics negative OB ROS                             Anesthesia Physical  Anesthesia Plan  ASA: II and emergent  Anesthesia Plan: General   Post-op Pain Management:    Induction: Intravenous  PONV Risk Score and Plan: 2 and Ondansetron, Treatment may vary due to age or medical condition and Midazolam  Airway Management Planned: LMA  Additional Equipment:   Intra-op Plan:   Post-operative Plan: Extubation in OR  Informed Consent: I have reviewed the patients History and Physical, chart, labs and discussed the procedure including the risks, benefits and alternatives for the proposed anesthesia with the patient or authorized representative who has indicated his/her understanding and acceptance.     Dental advisory given  Plan Discussed with: CRNA, Anesthesiologist and Surgeon  Anesthesia Plan Comments:          Anesthesia Quick Evaluation

## 2019-08-21 NOTE — Discharge Summary (Signed)
Physician Discharge Summary  Patient ID: [name] [name] MRN: [id number] DOB/AGE: [birth year] 63 y.o.  Admit date: 08/19/2019 Discharge date: 08/21/2019  Admission Diagnoses: Hemorrhagic cystitis, gross hematuria, clot retention  Discharge Diagnoses:  Active Problems:   Hematuria   Discharged Condition: good  Hospital Course: [name] [name] was admitted with recurrent gross hematuria and clot retention.  He was started on CBI.  The CBI failed and he was changed from a 75 [place] three-way catheter to a 22 [place] hematuria catheter.  Despite this he continued to develop clots or have residual clot in the bladder and his urine did not clear on CBI.  He was taken to the operating room where two active vessels were bleeding on the left side of the bladder and he underwent clot evacuation with bladder fulguration.  This was similar to cystoscopy on 08/05/2019 with Dr. Tresa [name].  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  Because of the ragged nests of the bladder and the urethra I left an 14 French catheter to gravity drainage.  He was stable postoperatively with clear urine and was discharged home on hospital day #2 and postop day #0.  He can remove the catheter in 3 to 5 days or come to the office for catheter removal.  He was instructed on how to remove the catheter.  I will keep him on a nightly cephalexin.  Consults: None  Significant Diagnostic Studies: none   Treatments: surgery: Cystoscopy with clot evacuation and bladder fulguration  Discharge Exam: Blood pressure 117/71, pulse 80, temperature 98.2 F (36.8 C), temperature source Oral, resp. rate 16, height 5\' 8"  (1.727 m), weight 84.4 kg, SpO2 100 %. Patient seen in PACU-no acute distress Cardiovascular-regular rate and rhythm Lungs-normal respiratory effort and depth GU-Foley catheter in place with clear urine  Disposition: Discharge disposition: 01-Home or Self Care        Allergies as  of 08/21/2019      Reactions   Tetracyclines & Related Rash      Medication List    STOP taking these medications   senna-docusate 8.6-50 MG tablet Commonly known as: Senokot-S   traMADol 50 MG tablet Commonly known as: Ultram     TAKE these medications   cephALEXin 500 MG capsule Commonly known as: KEFLEX Take 1 capsule (500 mg total) by mouth at bedtime for 5 days.   lisinopril-hydrochlorothiazide 10-12.5 MG tablet Commonly known as: ZESTORETIC Take 1 tablet by mouth daily.   loratadine 10 MG tablet Commonly known as: CLARITIN Take 10 mg by mouth daily.   Lupron Depot (59-Month) 45 MG injection Generic drug: Leuprolide Acetate (6 Month) Inject 45 mg into the skin every 6 (six) months.   omeprazole 20 MG tablet Commonly known as: PRILOSEC OTC Take 20 mg by mouth daily.   venlafaxine XR 75 MG 24 hr capsule Commonly known as: EFFEXOR-XR Take 75 mg by mouth daily with breakfast.      Follow-up Information    Schedule an appointment as soon as possible for a visit with [name] Frock, MD.   Specialty: Urology Contact information: [place]  [phone]           Signed: Festus [name] 08/21/2019, 5:54 PM

## 2019-08-21 NOTE — Transfer of Care (Signed)
Immediate Anesthesia Transfer of Care Note  Patient: [name] [name]  Procedure(s) Performed: CYSTOSCOPY/CLOT EVACUATION AND FULGURATION 2-5 CENTIMETERS (N/A Bladder)  Patient Location: PACU  Anesthesia Type:General  Level of Consciousness: drowsy and responds to stimulation  Airway & Oxygen Therapy: Patient Spontanous Breathing and Patient connected to face mask oxygen  Post-op Assessment: Report given to RN and Post -op Vital signs reviewed and stable  Post vital signs: Reviewed and stable  Last Vitals:  Vitals Value Taken Time  BP    Temp    Pulse    Resp    SpO2      Last Pain:  Vitals:   08/21/19 0758  TempSrc:   PainSc: 0-No pain      Patients Stated Pain Goal: 2 ([id number])  Complications: No apparent anesthesia complications

## 2019-08-21 NOTE — Op Note (Signed)
Preoperative diagnosis: Hemorrhagic cystitis, gross hematuria, clot retention Postoperative diagnosis: Same  Procedure: Cystoscopy with bladder fulguration 2 to 5 cm  Surgeon: Junious Silk  Anesthesia: General  Indication for procedure: [name] [name] is a 63 year old white male with a history of radiation for prostate cancer with recent fulguration for hemorrhagic cystitis.  He had recurrent gross hematuria and clot retention and has failed catheter placement and CBI.  He continues to have gross hematuria with clots.  Findings: On cystoscopy the urethra was quite ragged throughout the penile and membranous urethra from multiple catheters.  In the bladder there was a good bit of clot.  About 300 cc of clot evacuated.  There were 2 active hemorrhaging bleeders.  One on the left bladder wall at the edge of a pale area of prior fulguration.  Another on the left but closer to the bladder neck.  There to other areas at the right bladder neck closer to the urethra and left inferior bladder neck closer to the urethra but these may have been from scope passage.  There were 2 other areas left posterior and left inferior that had red clot on the wall that may have been bleeding.  I swept the clot off these areas and lightly fulgurated.  And all about 3 cm was fulgurated. After fulguration of these areas there was no active bleeding.  Hemostasis was excellent even at low pressure.  Therefore the bladder was filled slightly and the scope removed.  Lidocaine jelly was instilled per urethra and an 13 [place] two-way catheter was passed.  Drainage was clear.  I irrigated the catheter and irrigation was clear.  I given the fulguration of the bladder, recent catheters and ragged nests of the urethra I thought it best to leave a Foley for a few days.  Complications: None  Blood loss: Minimal  Specimens: None  Drains: 18 French catheter  Disposition: Patient stable to PACU.  I sent a message to the office to have him  return in the next 3 to 5 days for catheter removal.  I will keep him on a nightly cephalexin.

## 2019-08-21 NOTE — Anesthesia Procedure Notes (Signed)
Procedure Name: LMA Insertion Date/Time: 08/21/2019 12:03 PM Performed by: Lollie Sails, CRNA Pre-anesthesia Checklist: Patient identified, Emergency Drugs available, Suction available, Patient being monitored and Timeout performed Patient Re-evaluated:Patient Re-evaluated prior to induction Oxygen Delivery Method: Circle system utilized Preoxygenation: Pre-oxygenation with 100% oxygen Induction Type: IV induction Ventilation: Mask ventilation without difficulty LMA: LMA inserted LMA Size: 5.0 Number of attempts: 1 Placement Confirmation: positive ETCO2 and breath sounds checked- equal and bilateral Tube secured with: Tape Dental Injury: Teeth and Oropharynx as per pre-operative assessment

## 2019-08-21 NOTE — Anesthesia Postprocedure Evaluation (Signed)
Anesthesia Post Note  Patient: SEKOU ATTEBERY  Procedure(s) Performed: CYSTOSCOPY/CLOT EVACUATION AND FULGURATION 2-5 CENTIMETERS (N/A Bladder)     Patient location during evaluation: PACU Anesthesia Type: General Level of consciousness: awake and alert Pain management: pain level controlled Vital Signs Assessment: post-procedure vital signs reviewed and stable Respiratory status: spontaneous breathing, nonlabored ventilation and respiratory function stable Cardiovascular status: blood pressure returned to baseline and stable Postop Assessment: no apparent nausea or vomiting Anesthetic complications: no    Last Vitals:  Vitals:   08/21/19 1300 08/21/19 1315  BP: (!) 116/56 113/61  Pulse: 69   Resp: 13   Temp:    SpO2: 100%     Last Pain:  Vitals:   08/21/19 1315  TempSrc:   PainSc: 0-No pain                 Lynda Rainwater

## 2019-08-21 NOTE — Progress Notes (Signed)
Subjective: Patient reports   No complaints.  No bladder pain.  His CBI ran out and he had some red urine and clots last night.  He was transferred to Urology floor.  From 1533 to   1405.   His CBI was running on a moderate drip in light pain.  I turned the CBI off and came back about an hour later and it was red, too red to turn the CBI off.  Objective: Vital signs in last 24 hours: Temp:  [98.3 F (36.8 C)-99.3 F (37.4 C)] 99.1 F (37.3 C) (10/18 0621) Pulse Rate:  [71-76] 74 (10/18 0621) Resp:  [16-18] 16 (10/18 0621) BP: (103-138)/(60-72) 103/60 (10/18 0621) SpO2:  [98 %] 98 % (10/18 0621)  Intake/Output from previous day: 10/17 0701 - 10/18 0700 In: 26575.4 [P.O.:360; I.V.:715.4] Out: 34220 [Urine:34220] Intake/Output this shift: Total I/O In: 3000 [Other:3000] Out: 1500 [Urine:1500]  Physical Exam:    No acute distress  alert and oriented  abdomen soft and nontender  Foley catheter in place with red urine  no calf pain or swelling  Lab Results: Recent Labs    08/20/19 0706 08/20/19 1912 08/21/19 0523  HGB 8.9* 8.7* 8.0*  HCT 25.4* 25.4* 24.2*   BMET Recent Labs    08/20/19 0706 08/21/19 0523  NA 138 135  K 3.9 3.8  CL 104 103  CO2 25 24  GLUCOSE 119* 112*  BUN 8 8  CREATININE 0.80 0.88  CALCIUM 8.3* 7.8*   No results for input(s): LABPT, INR in the last 72 hours. No results for input(s): LABURIN in the last 72 hours. Results for orders placed or performed during the hospital encounter of 08/19/19  Urine C&S     Status: None   Collection Time: 08/19/19  9:00 PM   Specimen: Urine, Random  Result Value Ref Range Status   Specimen Description   Final    URINE, RANDOM Performed at [facility] [address], [place], [place] [address]    Special Requests   Final    NONE Performed at [facility], [place] [address], [place], [place] [address]    Culture   Final    NO GROWTH Performed at [facility], [place] [address] [place], [place] [address]    Report Status 08/21/2019 FINAL  Final  SARS CORONAVIRUS 2 (TAT 6-24 HRS) Nasopharyngeal Nasopharyngeal Swab     Status: None   Collection Time: 08/19/19 10:43 PM   Specimen: Nasopharyngeal Swab  Result Value Ref Range Status   SARS Coronavirus 2 NEGATIVE NEGATIVE Final    Comment: (NOTE) SARS-CoV-2 target nucleic acids are NOT DETECTED. The SARS-CoV-2 RNA is generally detectable in upper and lower respiratory specimens during the acute phase of infection. Negative results do not preclude SARS-CoV-2 infection, do not rule out co-infections with other pathogens, and should not be used as the sole basis for treatment or other patient management decisions. Negative results must be combined with clinical observations, patient history, and epidemiological information. The expected result is Negative. Fact Sheet for Patients: SugarRoll.be Fact Sheet for Healthcare Providers: https://www.woods-mathews.com/ This test is not yet approved or cleared by the [place] FDA and  has been authorized for detection and/or diagnosis of SARS-CoV-2 by FDA under an Emergency Use Authorization (EUA). This EUA will remain  in effect (meaning this test can be used) for the duration of the COVID-19 declaration under Section 56 4(b)(1) of the Act, 21 U.S.C. section 360bbb-3(b)(1), unless the authorization  is terminated or revoked sooner. Performed at [facility], [place] [address] [place], [place] [address]     Studies/Results: US Pelvis Limited (transabdominal Only)  Result Date: 08/20/2019 CLINICAL DATA:  Gross hematuria. EXAM: LIMITED ULTRASOUND OF PELVIS TECHNIQUE: Limited transabdominal ultrasound examination of the pelvis was performed. COMPARISON:  None. FINDINGS: Foley catheter seen in the decompressed bladder. Rounded increased echogenicity superior to the Foley catheter balloon is identified.  IMPRESSION: 1. Foley catheter balloon is seen in the decompressed bladder. 2. Rounded increased echogenicity superior to the Foley catheter balloon could simply represent blood products given history of gross hematuria. This study cannot exclude a mass in the bladder. Electronically Signed   By: Dorise Bullion III M.D   On: 08/20/2019 12:59    Assessment/Plan:   gross hematuria- prior findings on Dr. Zettie Pho cystoscopy: FINDINGS: 1.  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 2.  Complete resolution of all bleeding following fulguration. 3.  Very small bladder stone on exposed prior DVC staple.  This was completely removed.  No evidence of additional intraluminal protrusion.    Today, patient continues to require CBI and manual irrigation for clots.  Discussed with the patient and his wife he could have a new bleed, 1 of the old sites bleeding again or some old residual clot.  Difficult to tell but he dropped his hemoglobin again.  We went over the nature risks benefits and alternatives to cystoscopy with  Fulguration, possible biopsy or TURBT (not likely as Dr. Tresa Moore did not see any neoplastic changes). We discussed side effects of the proposed treatment, the likelihood of the patient achieving the goals of the procedure, and any potential problems that might occur during the procedure or recuperation.   We discussed possibility bleeding recurrence in the future. All questions answered. We discussed alternatives such as continued CBI and other CBI additives such as carboprost. All questions answered and he elected to proceed to the OR.   LOS: 1 day   Festus Aloe 08/21/2019, 10:31 AM

## 2019-08-22 ENCOUNTER — Encounter (HOSPITAL_COMMUNITY): Payer: Self-pay | Admitting: Urology

## 2019-09-12 ENCOUNTER — Emergency Department (HOSPITAL_COMMUNITY)
Admission: EM | Admit: 2019-09-12 | Discharge: 2019-09-13 | Disposition: A | Discharge status: Home / Self Care | Payer: Federal, State, Local not specified - PPO | Source: Home / Self Care | Attending: Emergency Medicine | Admitting: Emergency Medicine

## 2019-09-12 ENCOUNTER — Encounter (HOSPITAL_COMMUNITY): Payer: Self-pay

## 2019-09-12 ENCOUNTER — Other Ambulatory Visit: Payer: Self-pay

## 2019-09-12 DIAGNOSIS — Z9079 Acquired absence of other genital organ(s): Secondary | ICD-10-CM | POA: Diagnosis not present

## 2019-09-12 DIAGNOSIS — F1210 Cannabis abuse, uncomplicated: Secondary | ICD-10-CM | POA: Insufficient documentation

## 2019-09-12 DIAGNOSIS — K219 Gastro-esophageal reflux disease without esophagitis: Secondary | ICD-10-CM | POA: Diagnosis not present

## 2019-09-12 DIAGNOSIS — N3041 Irradiation cystitis with hematuria: Secondary | ICD-10-CM | POA: Diagnosis not present

## 2019-09-12 DIAGNOSIS — R310 Gross hematuria: Secondary | ICD-10-CM | POA: Insufficient documentation

## 2019-09-12 DIAGNOSIS — D649 Anemia, unspecified: Secondary | ICD-10-CM | POA: Insufficient documentation

## 2019-09-12 DIAGNOSIS — Z8546 Personal history of malignant neoplasm of prostate: Secondary | ICD-10-CM | POA: Insufficient documentation

## 2019-09-12 DIAGNOSIS — Z79899 Other long term (current) drug therapy: Secondary | ICD-10-CM | POA: Insufficient documentation

## 2019-09-12 DIAGNOSIS — I10 Essential (primary) hypertension: Secondary | ICD-10-CM | POA: Diagnosis not present

## 2019-09-12 DIAGNOSIS — I700 Atherosclerosis of aorta: Secondary | ICD-10-CM | POA: Diagnosis not present

## 2019-09-12 DIAGNOSIS — E871 Hypo-osmolality and hyponatremia: Secondary | ICD-10-CM

## 2019-09-12 DIAGNOSIS — R338 Other retention of urine: Secondary | ICD-10-CM

## 2019-09-12 DIAGNOSIS — C61 Malignant neoplasm of prostate: Secondary | ICD-10-CM | POA: Diagnosis not present

## 2019-09-12 DIAGNOSIS — Z87442 Personal history of urinary calculi: Secondary | ICD-10-CM | POA: Diagnosis not present

## 2019-09-12 DIAGNOSIS — Z881 Allergy status to other antibiotic agents status: Secondary | ICD-10-CM | POA: Diagnosis not present

## 2019-09-12 DIAGNOSIS — Z20828 Contact with and (suspected) exposure to other viral communicable diseases: Secondary | ICD-10-CM | POA: Insufficient documentation

## 2019-09-12 LAB — CBC WITH DIFFERENTIAL/PLATELET
Abs Immature Granulocytes: 0.09 10*3/uL — ABNORMAL HIGH (ref 0.00–0.07)
Basophils Absolute: 0 10*3/uL (ref 0.0–0.1)
Basophils Relative: 0 %
Eosinophils Absolute: 0 10*3/uL (ref 0.0–0.5)
Eosinophils Relative: 0 %
HCT: 23.1 % — ABNORMAL LOW (ref 39.0–52.0)
Hemoglobin: 7.5 g/dL — ABNORMAL LOW (ref 13.0–17.0)
Immature Granulocytes: 1 %
Lymphocytes Relative: 11 %
Lymphs Abs: 1.1 10*3/uL (ref 0.7–4.0)
MCH: 28.8 pg (ref 26.0–34.0)
MCHC: 32.5 g/dL (ref 30.0–36.0)
MCV: 88.8 fL (ref 80.0–100.0)
Monocytes Absolute: 0.9 10*3/uL (ref 0.1–1.0)
Monocytes Relative: 9 %
Neutro Abs: 7.9 10*3/uL — ABNORMAL HIGH (ref 1.7–7.7)
Neutrophils Relative %: 79 %
Platelets: 385 10*3/uL (ref 150–400)
RBC: 2.6 MIL/uL — ABNORMAL LOW (ref 4.22–5.81)
RDW: 14 % (ref 11.5–15.5)
WBC: 10 10*3/uL (ref 4.0–10.5)
nRBC: 0 % (ref 0.0–0.2)

## 2019-09-12 MED ORDER — SODIUM CHLORIDE 0.9 % IR SOLN: 3000.0000 mL | Status: DC

## 2019-09-12 MED ORDER — LIDOCAINE HCL URETHRAL/MUCOSAL 2 % EX GEL
1.0000 "application " | Freq: Once | CUTANEOUS | Status: DC | PRN
  Filled 2019-09-12: qty 5

## 2019-09-12 NOTE — ED Provider Notes (Signed)
[facility] DEPT Provider Note   CSN: [id number] Arrival date & time: 09/12/19  2016    History   Chief Complaint Chief Complaint  Patient presents with   Urinary Retention    HPI [name] [name] is a 63 y.o. male.   The history is provided by the patient.  He has history of hypertension, prostate cancer with radiation cystitis and comes in for inability to urinate.  He has been having gross hematuria with passing clots on an ongoing basis, and had been admitted to the hospital twice for cystoscopy with fulguration of bleeding sites.  He has been having difficulty urinating since early this morning.  He has passed some clots, but has not felt like he has emptied his bladder completely.  He denies abdominal pain or flank pain.  He denies fever, chills, sweats.  He denies nausea or vomiting.  He is a scheduled to start hyperbaric oxygen therapy tomorrow.  Past Medical History:  Diagnosis Date   Foley catheter in place    placed 07-30-2019   GERD (gastroesophageal reflux disease)    History of kidney stones    Hypertension    Prostate cancer ([place]) 2015   Urinary retention     Patient Active Problem List   Diagnosis Date Noted   Hematuria 08/19/2019   Malignant neoplasm of prostate ([place]) 07/12/2014    Past Surgical History:  Procedure Laterality Date   CYSTOSCOPY N/A 08/21/2019   Procedure: CYSTOSCOPY/CLOT EVACUATION AND FULGURATION 2-5 CENTIMETERS;  Surgeon: Festus Aloe, MD;  Location: [facility];  Service: Urology;  Laterality: N/A;   CYSTOSCOPY WITH FULGERATION N/A 08/05/2019   Procedure: CYSTOSCOPY CLOT EVACUATION WITH FULGERATION;  Surgeon: Alexis Frock, MD;  Location: [facility];  Service: Urology;  Laterality: N/A;   left bicep surgery     reattached   LYMPHADENECTOMY Bilateral 07/12/2014   Procedure: PELVIC LYMPH NODE DISSECTION;  Surgeon: Alexis Frock, MD;  Location: [facility];  Service: Urology;   Laterality: Bilateral;   mucous seal  age 39   removed from lip   [place] N/A 07/12/2014   Procedure: ROBOTIC ASSISTED LAPAROSCOPIC RADICAL PROSTATECTOMY , INDOCYANINE GREEN DYE INJECTION;  Surgeon: Alexis Frock, MD;  Location: [facility];  Service: Urology;  Laterality: N/A;  3.5 HRS         Home Medications    Prior to Admission medications   Medication Sig Start Date End Date Taking? Authorizing Provider  lisinopril-hydrochlorothiazide (ZESTORETIC) 10-12.5 MG tablet Take 1 tablet by mouth daily.    [provider]  loratadine (CLARITIN) 10 MG tablet Take 10 mg by mouth daily.    [provider]  LUPRON DEPOT, 57-MONTH, 45 MG injection Inject 45 mg into the skin every 6 (six) months.  07/15/19   [provider]  omeprazole (PRILOSEC OTC) 20 MG tablet Take 20 mg by mouth daily.    [provider]  venlafaxine XR (EFFEXOR-XR) 75 MG 24 hr capsule Take 75 mg by mouth daily with breakfast.    [provider]    Family History Family History  Problem Relation Age of Onset   Heart disease Mother    Cancer Father        throat   Cancer Paternal Grandfather        ? prostate cancer    Social History Social History   Tobacco Use   Smoking status: Never Smoker   Smokeless tobacco: Former Systems developer    Types: Loss adjuster, chartered  Substance Use Topics   Alcohol use: Yes    Alcohol/week: 2.0 standard drinks    Types: 2 Cans of beer per week    Comment: drinks daily 2 beers or drinks   Drug use: Yes    Types: Marijuana    Comment: last used marijuana 2 3 weeks ago as of 08-02-2019     Allergies   Tetracyclines & related   Review of Systems Review of Systems  All other systems reviewed and are negative.    Physical Exam Updated Vital Signs BP (!) 173/87 (BP Location: Left Arm)    Pulse 89    Temp 97.9 F (36.6 C) (Oral)    Resp 20    SpO2 100%   Physical Exam Vitals signs and nursing note reviewed.     63 year old male, appears uncomfortable, but is in no acute distress. Vital signs are significant for elevated blood pressure. Oxygen saturation is 100%, which is normal. Head is normocephalic and atraumatic. PERRLA, EOMI. Oropharynx is clear. Neck is nontender and supple without adenopathy or JVD. Back is nontender and there is no CVA tenderness. Lungs are clear without rales, wheezes, or rhonchi. Chest is nontender. Heart has regular rate and rhythm without murmur. Abdomen is soft, nontender with distended bladder.  There are no other masses or hepatosplenomegaly and peristalsis is normoactive. Extremities have no cyanosis or edema, full range of motion is present. Skin is warm and dry without rash. Neurologic: Mental status is normal, cranial nerves are intact, there are no motor or sensory deficits.  ED Treatments / Results  Labs (all labs ordered are listed, but only abnormal results are displayed) Labs Reviewed  URINALYSIS, [place] PANEL  CBC    EKG None  Radiology No results found.  Procedures Procedures   Medications Ordered in ED Medications  lidocaine (XYLOCAINE) 2 % jelly 1 application (has no administration in time range)     Initial Impression / Assessment and Plan / ED Course  I have reviewed the triage vital signs and the nursing notes.  Pertinent labs & imaging results that were available during my care of the patient were reviewed by me and considered in my medical decision making (see chart for details).  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  Old records are reviewed confirming 2 recent hospitalizations with cystoscopy to treat bladder hemorrhage.  Given this history, I feel it is unlikely that bladder irrigation will be successful, but will start with continuous bladder irrigation.  COVID-19 screen is obtained.  Unable to place 24 [place] Foley catheter.  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  Discussed with Dr. Lovena Neighbours, on-call for urology who requests that we place a 69 French three-way catheter or, if unable to place time, place a 20 [place] three-way catheter.  33 French catheter is barely able to be passed beyond the proximal urethral stricture and will not advanced even to the level of the prostate.  48 French catheter was able to be passed successfully holding dark red blood with clots.  Unfortunately, 20 [place] three-way cath Foley catheter was not available.  We will proceed with bladder irrigation.  Hemoglobin has come back at 7.5, which is a 0.5 g drop from October 18.  With bladder irrigation, drainage has improved to a light pink.  This is probably the best that will be achieved since he has had ongoing problems with bleeding over  the span of greater than a month.  He was observed in there was no worsening of his drainage.  He was felt to be stable for discharge.  The remainder of labs showed mild hyponatremia which is not felt to be clinically significant, normal renal function.  He has an appointment at the hyperbaric oxygen center today, and he is to keep that appointment.  He is referred back to urology for follow-up.  I am concerned that he will have increased bleeding and will occlude his catheter.  He is advised to return should that happen.  Final Clinical Impressions(s) / ED Diagnoses   Final diagnoses:  Gross hematuria  Acute urinary retention  Normochromic normocytic anemia  Hyponatremia    ED Discharge Orders    None       Delora Fuel, MD [id number] [phone]

## 2019-09-12 NOTE — ED Notes (Signed)
Bladder scan completed. Estimated between 400-484mL

## 2019-09-13 ENCOUNTER — Encounter (HOSPITAL_BASED_OUTPATIENT_CLINIC_OR_DEPARTMENT_OTHER): Payer: Federal, State, Local not specified - PPO | Attending: Internal Medicine | Admitting: Internal Medicine

## 2019-09-13 ENCOUNTER — Ambulatory Visit (HOSPITAL_COMMUNITY)
Admission: RE | Admit: 2019-09-13 | Discharge: 2019-09-13 | Disposition: A | Discharge status: Home / Self Care | Payer: Federal, State, Local not specified - PPO | Source: Ambulatory Visit | Attending: Internal Medicine | Admitting: Internal Medicine

## 2019-09-13 ENCOUNTER — Observation Stay (HOSPITAL_COMMUNITY)
Admission: EM | Admit: 2019-09-13 | Discharge: 2019-09-15 | Disposition: A | Discharge status: Home / Self Care | Payer: Federal, State, Local not specified - PPO | Attending: Internal Medicine | Admitting: Internal Medicine

## 2019-09-13 ENCOUNTER — Other Ambulatory Visit: Payer: Self-pay

## 2019-09-13 ENCOUNTER — Encounter (HOSPITAL_COMMUNITY): Payer: Self-pay | Admitting: Emergency Medicine

## 2019-09-13 ENCOUNTER — Other Ambulatory Visit (HOSPITAL_COMMUNITY): Payer: Self-pay | Admitting: Internal Medicine

## 2019-09-13 DIAGNOSIS — Z8546 Personal history of malignant neoplasm of prostate: Secondary | ICD-10-CM | POA: Insufficient documentation

## 2019-09-13 DIAGNOSIS — K219 Gastro-esophageal reflux disease without esophagitis: Secondary | ICD-10-CM | POA: Insufficient documentation

## 2019-09-13 DIAGNOSIS — D649 Anemia, unspecified: Principal | ICD-10-CM | POA: Insufficient documentation

## 2019-09-13 DIAGNOSIS — Z79899 Other long term (current) drug therapy: Secondary | ICD-10-CM | POA: Insufficient documentation

## 2019-09-13 DIAGNOSIS — I700 Atherosclerosis of aorta: Secondary | ICD-10-CM | POA: Insufficient documentation

## 2019-09-13 DIAGNOSIS — Z881 Allergy status to other antibiotic agents status: Secondary | ICD-10-CM | POA: Insufficient documentation

## 2019-09-13 DIAGNOSIS — Z20828 Contact with and (suspected) exposure to other viral communicable diseases: Secondary | ICD-10-CM | POA: Insufficient documentation

## 2019-09-13 DIAGNOSIS — R310 Gross hematuria: Secondary | ICD-10-CM | POA: Insufficient documentation

## 2019-09-13 DIAGNOSIS — Z9289 Personal history of other medical treatment: Secondary | ICD-10-CM

## 2019-09-13 DIAGNOSIS — N3041 Irradiation cystitis with hematuria: Secondary | ICD-10-CM | POA: Diagnosis not present

## 2019-09-13 DIAGNOSIS — Z87442 Personal history of urinary calculi: Secondary | ICD-10-CM | POA: Insufficient documentation

## 2019-09-13 DIAGNOSIS — R319 Hematuria, unspecified: Secondary | ICD-10-CM | POA: Diagnosis present

## 2019-09-13 DIAGNOSIS — Z9079 Acquired absence of other genital organ(s): Secondary | ICD-10-CM | POA: Insufficient documentation

## 2019-09-13 DIAGNOSIS — C61 Malignant neoplasm of prostate: Secondary | ICD-10-CM | POA: Insufficient documentation

## 2019-09-13 DIAGNOSIS — I10 Essential (primary) hypertension: Secondary | ICD-10-CM | POA: Insufficient documentation

## 2019-09-13 LAB — BASIC METABOLIC PANEL
Anion gap: 16 — ABNORMAL HIGH (ref 5–15)
BUN: 16 mg/dL (ref 8–23)
CO2: 18 mmol/L — ABNORMAL LOW (ref 22–32)
Calcium: 9.1 mg/dL (ref 8.9–10.3)
Chloride: 95 mmol/L — ABNORMAL LOW (ref 98–111)
Creatinine, Ser: 1.11 mg/dL (ref 0.61–1.24)
GFR calc Af Amer: >60 mL/min (ref >60)
GFR calc non Af Amer: >60 mL/min (ref >60)
Glucose, Bld: 133 mg/dL — ABNORMAL HIGH (ref 70–99)
Potassium: 3.5 mmol/L (ref 3.5–5.1)
Sodium: 129 mmol/L — ABNORMAL LOW (ref 135–145)

## 2019-09-13 LAB — SARS CORONAVIRUS 2 (TAT 6-24 HRS): SARS Coronavirus 2: NEGATIVE

## 2019-09-13 MED ORDER — MORPHINE SULFATE (PF) 4 MG/ML IV SOLN
4.0000 mg | Freq: Once | INTRAVENOUS | Status: AC
  Administered 2019-09-13: 01:00:00 4 mg via INTRAVENOUS
  Filled 2019-09-13: qty 1

## 2019-09-13 MED ORDER — ONDANSETRON HCL 4 MG/2ML IJ SOLN
4.0000 mg | Freq: Once | INTRAMUSCULAR | Status: AC
  Administered 2019-09-13: 4 mg via INTRAVENOUS
  Filled 2019-09-13: qty 2

## 2019-09-13 NOTE — ED Notes (Signed)
Per prior nurse Levada Dy, 1000 cc NS irrigated through catheter manually due to inability to obtain 3 way cath

## 2019-09-13 NOTE — Discharge Instructions (Signed)
Drink plenty of fluids.  Return if your catheter stops draining.

## 2019-09-13 NOTE — ED Notes (Signed)
Pt now draining light pink/ clear from his foley.

## 2019-09-13 NOTE — ED Notes (Signed)
Irrigated bladder with 1000 mL 0.9% sterile saline

## 2019-09-13 NOTE — ED Notes (Signed)
Bladder scan 330 ml

## 2019-09-13 NOTE — ED Notes (Signed)
Dr. Roxanne Mins aware about inability to get urine sample due to bladder irrigation

## 2019-09-13 NOTE — ED Notes (Signed)
Pts foley cath irrigated manually prior to discharge, no clots noted during irrigation

## 2019-09-13 NOTE — ED Triage Notes (Signed)
Patient reports foley clogged since 1600. States foley placed last night for hematuria.

## 2019-09-14 ENCOUNTER — Encounter (HOSPITAL_COMMUNITY): Payer: Self-pay | Admitting: Internal Medicine

## 2019-09-14 DIAGNOSIS — I10 Essential (primary) hypertension: Secondary | ICD-10-CM | POA: Diagnosis present

## 2019-09-14 DIAGNOSIS — D649 Anemia, unspecified: Secondary | ICD-10-CM | POA: Diagnosis present

## 2019-09-14 DIAGNOSIS — K219 Gastro-esophageal reflux disease without esophagitis: Secondary | ICD-10-CM | POA: Diagnosis present

## 2019-09-14 LAB — CBC WITH DIFFERENTIAL/PLATELET
Abs Immature Granulocytes: 0.03 10*3/uL (ref 0.00–0.07)
Basophils Absolute: 0 10*3/uL (ref 0.0–0.1)
Basophils Relative: 0 %
Eosinophils Absolute: 0 10*3/uL (ref 0.0–0.5)
Eosinophils Relative: 0 %
HCT: 20.7 % — ABNORMAL LOW (ref 39.0–52.0)
Hemoglobin: 6.2 g/dL — CL (ref 13.0–17.0)
Immature Granulocytes: 0 %
Lymphocytes Relative: 13 %
Lymphs Abs: 1 10*3/uL (ref 0.7–4.0)
MCH: 27.8 pg (ref 26.0–34.0)
MCHC: 30 g/dL (ref 30.0–36.0)
MCV: 92.8 fL (ref 80.0–100.0)
Monocytes Absolute: 0.8 10*3/uL (ref 0.1–1.0)
Monocytes Relative: 10 %
Neutro Abs: 5.8 10*3/uL (ref 1.7–7.7)
Neutrophils Relative %: 77 %
Platelets: 264 10*3/uL (ref 150–400)
RBC: 2.23 MIL/uL — ABNORMAL LOW (ref 4.22–5.81)
RDW: 14.4 % (ref 11.5–15.5)
WBC: 7.6 10*3/uL (ref 4.0–10.5)
nRBC: 0 % (ref 0.0–0.2)

## 2019-09-14 LAB — BASIC METABOLIC PANEL
Anion gap: 9 (ref 5–15)
BUN: 20 mg/dL (ref 8–23)
CO2: 22 mmol/L (ref 22–32)
Calcium: 8.7 mg/dL — ABNORMAL LOW (ref 8.9–10.3)
Chloride: 102 mmol/L (ref 98–111)
Creatinine, Ser: 0.99 mg/dL (ref 0.61–1.24)
GFR calc Af Amer: >60 mL/min (ref >60)
GFR calc non Af Amer: >60 mL/min (ref >60)
Glucose, Bld: 103 mg/dL — ABNORMAL HIGH (ref 70–99)
Potassium: 3.6 mmol/L (ref 3.5–5.1)
Sodium: 133 mmol/L — ABNORMAL LOW (ref 135–145)

## 2019-09-14 LAB — HEPATIC FUNCTION PANEL
ALT: 24 U/L (ref 0–44)
AST: 28 U/L (ref 15–41)
Albumin: 3.8 g/dL (ref 3.5–5.0)
Alkaline Phosphatase: 61 U/L (ref 38–126)
Bilirubin, Direct: 0.1 mg/dL (ref 0.0–0.2)
Indirect Bilirubin: 0.4 mg/dL (ref 0.3–0.9)
Total Bilirubin: 0.5 mg/dL (ref 0.3–1.2)
Total Protein: 6.4 g/dL — ABNORMAL LOW (ref 6.5–8.1)

## 2019-09-14 LAB — POCT I-STAT, CHEM 8
BUN: 18 mg/dL (ref 8–23)
Calcium, Ion: 1.15 mmol/L (ref 1.15–1.40)
Chloride: 99 mmol/L (ref 98–111)
Creatinine, Ser: 1 mg/dL (ref 0.61–1.24)
Glucose, Bld: 99 mg/dL (ref 70–99)
HCT: 18 % — ABNORMAL LOW (ref 39.0–52.0)
Hemoglobin: 6.1 g/dL — CL (ref 13.0–17.0)
Potassium: 3.8 mmol/L (ref 3.5–5.1)
Sodium: 134 mmol/L — ABNORMAL LOW (ref 135–145)
TCO2: 22 mmol/L (ref 22–32)

## 2019-09-14 LAB — CBC
HCT: 19.6 % — ABNORMAL LOW (ref 39.0–52.0)
Hemoglobin: 5.7 g/dL — CL (ref 13.0–17.0)
MCH: 27.1 pg (ref 26.0–34.0)
MCHC: 29.1 g/dL — ABNORMAL LOW (ref 30.0–36.0)
MCV: 93.3 fL (ref 80.0–100.0)
Platelets: 247 10*3/uL (ref 150–400)
RBC: 2.1 MIL/uL — ABNORMAL LOW (ref 4.22–5.81)
RDW: 14.3 % (ref 11.5–15.5)
WBC: 7.5 10*3/uL (ref 4.0–10.5)
nRBC: 0 % (ref 0.0–0.2)

## 2019-09-14 LAB — TYPE AND SCREEN
ABO/RH(D): O POS
Antibody Screen: NEGATIVE

## 2019-09-14 LAB — PREPARE RBC (CROSSMATCH)

## 2019-09-14 LAB — HEMOGLOBIN AND HEMATOCRIT, BLOOD
HCT: 27.9 % — ABNORMAL LOW (ref 39.0–52.0)
Hemoglobin: 8.7 g/dL — ABNORMAL LOW (ref 13.0–17.0)

## 2019-09-14 LAB — HIV ANTIBODY (ROUTINE TESTING W REFLEX): HIV Screen 4th Generation wRfx: NONREACTIVE

## 2019-09-14 LAB — MAGNESIUM: Magnesium: 2.3 mg/dL (ref 1.7–2.4)

## 2019-09-14 LAB — PHOSPHORUS: Phosphorus: 3.4 mg/dL (ref 2.5–4.6)

## 2019-09-14 MED ORDER — LISINOPRIL 10 MG PO TABS
10.0000 mg | ORAL_TABLET | Freq: Every day | ORAL | Status: DC
  Filled 2019-09-14 (×2): qty 1

## 2019-09-14 MED ORDER — CHLORHEXIDINE GLUCONATE CLOTH 2 % EX PADS
6.0000 | MEDICATED_PAD | Freq: Every day | CUTANEOUS | Status: DC
  Administered 2019-09-14 – 2019-09-15 (×2): 6 via TOPICAL

## 2019-09-14 MED ORDER — HYDROCHLOROTHIAZIDE 12.5 MG PO CAPS
12.5000 mg | ORAL_CAPSULE | Freq: Every day | ORAL | Status: DC
  Filled 2019-09-14 (×2): qty 1

## 2019-09-14 MED ORDER — SODIUM CHLORIDE 0.9 % IV SOLN
10.0000 mL/h | Freq: Once | INTRAVENOUS | Status: AC
  Administered 2019-09-14: 02:00:00 10 mL/h via INTRAVENOUS

## 2019-09-14 MED ORDER — LORATADINE 10 MG PO TABS
10.0000 mg | ORAL_TABLET | Freq: Every day | ORAL | Status: DC
  Administered 2019-09-14 – 2019-09-15 (×2): 10 mg via ORAL
  Filled 2019-09-14 (×2): qty 1

## 2019-09-14 MED ORDER — PROCHLORPERAZINE EDISYLATE 10 MG/2ML IJ SOLN: 5.0000 mg | INTRAMUSCULAR | Status: DC | PRN

## 2019-09-14 MED ORDER — PANTOPRAZOLE SODIUM 40 MG PO TBEC
40.0000 mg | DELAYED_RELEASE_TABLET | Freq: Every day | ORAL | Status: DC
  Administered 2019-09-14 – 2019-09-15 (×2): 40 mg via ORAL
  Filled 2019-09-14 (×2): qty 1

## 2019-09-14 MED ORDER — ACETAMINOPHEN 325 MG PO TABS: 650.0000 mg | ORAL_TABLET | Freq: Four times a day (QID) | ORAL | Status: DC | PRN

## 2019-09-14 MED ORDER — OMEPRAZOLE MAGNESIUM 20 MG PO TBEC: 20.0000 mg | DELAYED_RELEASE_TABLET | Freq: Every day | ORAL | Status: DC

## 2019-09-14 MED ORDER — ACETAMINOPHEN 650 MG RE SUPP: 650.0000 mg | Freq: Four times a day (QID) | RECTAL | Status: DC | PRN

## 2019-09-14 MED ORDER — VENLAFAXINE HCL ER 75 MG PO CP24
75.0000 mg | ORAL_CAPSULE | Freq: Every day | ORAL | Status: DC
  Administered 2019-09-14 – 2019-09-15 (×2): 75 mg via ORAL
  Filled 2019-09-14 (×2): qty 1

## 2019-09-14 MED ORDER — LISINOPRIL-HYDROCHLOROTHIAZIDE 10-12.5 MG PO TABS: 1.0000 | ORAL_TABLET | Freq: Every day | ORAL | Status: DC

## 2019-09-14 NOTE — Progress Notes (Signed)
Spoke with DJ at wound care center and told him the manufacturer of the pt catheter he stated that he would be fine doing hyperbaric treatment with the type of catheter that he has.

## 2019-09-14 NOTE — Progress Notes (Signed)
[name] [name] is a 63 y.o. male with medical history significant GERD, urolithiasis, hypertension, prostate cancer, history of urinary retention who is coming to the emergency department due to having a clogged Foley catheter, found to have gross hematuria and hemoglobin of 5.7. 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  He is a known patient with urology with multiple recent visits due to gross hematuria.  Urology was consulted.  They are planning to start him on hyperbaric oxygen. They want to monitor hemoglobin today and then discharge early morning tomorrow morning so they can start him on hyperbaric oxygen therapy tomorrow as an outpatient.  Posttransfusion hemoglobin was 8.7. Patient was feeling better with no complaints when seen this morning.  -We will check CBC in the morning again and then discharge him so he can start his hyperbaric oxygen therapy for recurrent hematuria as an outpatient.

## 2019-09-14 NOTE — Consult Note (Signed)
Urology Consult  Referring physician: D Olevia Bowens Reason for referral: Hematuria  Chief Complaint: Hematuria  History of Present Illness: Patient well known to Urology Dr. Tresa Moore  With history of prostate cancer.  He has had a prostatectomy  With local recurrence and radiation.  He has had issues with radiation cystitis.  October 2nd he had a mild-to-moderate amount of clot removed from bladder and an erythematous area fulgurated.  He has been seen by Urology recently for recurrent hematuria and was given an antibiotic and is culture was negative. Patient had another irrigation with to light area is fulgurated by my partner October 18  He developed clot retention and came the emergency room.  The emergency room team did a very good job in inserting 3 way catheter and irrigated him.  They are very comfortable there was no clots but is still quite red so we admitted him with continuous bladder irrigation   patient came in last night with clot retention and now has a hemoglobin of just over 5.  He was transfused 2 units of blood.  He has started on hyperbaric oxygen.   They could not get in his 3 way catheter apparently  Patient was actually home with a catheter when he came in last night and could not be irrigated.  They changed it and irrigated mild to moderate clots last night based on history but none since he feels comfortable  By nursing catheter draining well  Modifying factors: There are no other modifying factors  Associated signs and symptoms: There are no other associated signs and symptoms Aggravating and relieving factors: There are no other aggravating or relieving factors Severity: Moderate Duration: Persistent   Past Medical History:  Diagnosis Date  . Foley catheter in place    placed 07-30-2019  . GERD (gastroesophageal reflux disease)   . History of kidney stones   . Hypertension   . Prostate cancer ([place]) 2015  . Urinary retention    Past Surgical History:  Procedure  Laterality Date  . CYSTOSCOPY N/A 08/21/2019   Procedure: CYSTOSCOPY/CLOT EVACUATION AND FULGURATION 2-5 CENTIMETERS;  Surgeon: Festus Aloe, MD;  [facility];  Service: Urology;  Laterality: N/A;  . CYSTOSCOPY WITH FULGERATION N/A 08/05/2019   Procedure: CYSTOSCOPY CLOT EVACUATION WITH FULGERATION;  Surgeon: Alexis Frock, MD;  Location: [facility];  Service: Urology;  Laterality: N/A;  . left bicep surgery     reattached  . LYMPHADENECTOMY Bilateral 07/12/2014   Procedure: PELVIC LYMPH NODE DISSECTION;  Surgeon: Alexis Frock, MD;  Location[facility];  Service: Urology;  Laterality: Bilateral;  . mucous seal  age 58   removed from lip  . ROBOT ASSISTED LAPAROSCOPIC RADICAL PROSTATECTOMY N/A 07/12/2014   Procedure: ROBOTIC ASSISTED LAPAROSCOPIC RADICAL PROSTATECTOMY , INDOCYANINE GREEN DYE INJECTION;  Surgeon: Alexis Frock, MD;  [facility];  Service: Urology;  Laterality: N/A;  3.5 HRS     Medications: I have reviewed the patient's current medications. Allergies:  Allergies  Allergen Reactions  . Tetracyclines & Related Rash    Family History  Problem Relation Age of Onset  . Heart disease Mother   . Cancer Father        throat  . Cancer Paternal Grandfather        ? prostate cancer   Social History:  reports that he has never smoked. He quit smokeless tobacco use about 25 years ago.  His smokeless tobacco use included chew. He reports current alcohol use of about 2.0 standard drinks  of alcohol per week. He reports current drug use. Drug: Marijuana.  ROS: All systems are reviewed and negative except as noted.   Physical Exam:  Vital signs in last 24 hours: Temp:  [98.5 F (36.9 C)-99.7 F (37.6 C)] 98.5 F (36.9 C) (11/11 1030) Pulse Rate:  [68-101] 74 (11/11 1030) Resp:  [16-20] 16 (11/11 1030) BP: (106-143)/(52-79) 106/57 (11/11 1030) SpO2:  [96 %-100 %] 100 % (11/11 1030) Weight:  [84.4 kg] 84.4 kg (11/10 2232)  Cardiovascular: Skin  warm; not flushed Respiratory: Breaths quiet; no shortness of breath Abdomen: No masses Neurological: Normal sensation to touch Musculoskeletal: Normal motor function arms and legs Lymphatics: No inguinal adenopathy Skin: No rashes Genitourinary: 20 French catheter draining well with some dark urine in tubing but otherwise draining well no large clots  Laboratory Data:  Results for orders placed or performed during the hospital encounter of 09/13/19 (from the past 72 hour(s))  I-STAT, chem 8     Status: Abnormal   Collection Time: 09/14/19 12:29 AM  Result Value Ref Range   Sodium 134 (L) 135 - 145 mmol/L   Potassium 3.8 3.5 - 5.1 mmol/L   Chloride 99 98 - 111 mmol/L   BUN 18 8 - 23 mg/dL   Creatinine, Ser 1.00 0.61 - 1.24 mg/dL   Glucose, Bld 99 70 - 99 mg/dL   Calcium, Ion 1.15 1.15 - 1.40 mmol/L   TCO2 22 22 - 32 mmol/L   Hemoglobin 6.1 (LL) 13.0 - 17.0 g/dL   HCT 18.0 (L) 39.0 - 52.0 %   Comment NOTIFIED PHYSICIAN   Basic metabolic panel     Status: Abnormal   Collection Time: 09/14/19  1:14 AM  Result Value Ref Range   Sodium 133 (L) 135 - 145 mmol/L   Potassium 3.6 3.5 - 5.1 mmol/L   Chloride 102 98 - 111 mmol/L   CO2 22 22 - 32 mmol/L   Glucose, Bld 103 (H) 70 - 99 mg/dL   BUN 20 8 - 23 mg/dL   Creatinine, Ser 0.99 0.61 - 1.24 mg/dL   Calcium 8.7 (L) 8.9 - 10.3 mg/dL   GFR calc non Af Amer >60 >60 mL/min   GFR calc Af Amer >60 >60 mL/min   Anion gap 9 5 - 15    Comment: Performed at [facility], [place] [address] [place],  [address]  CBC with Differential     Status: Abnormal   Collection Time: 09/14/19  1:14 AM  Result Value Ref Range   WBC 7.6 4.0 - 10.5 K/uL   RBC 2.23 (L) 4.22 - 5.81 MIL/uL   Hemoglobin 6.2 (LL) 13.0 - 17.0 g/dL    Comment: REPEATED TO VERIFY THIS CRITICAL RESULT HAS VERIFIED AND BEEN CALLED TO RN R RAMANDO BY ALEXIS Cearfoss ON 11 11 2020 AT 0219, AND HAS BEEN READ BACK.     HCT 20.7 (L) 39.0 - 52.0 %   MCV  92.8 80.0 - 100.0 fL   MCH 27.8 26.0 - 34.0 pg   MCHC 30.0 30.0 - 36.0 g/dL   RDW 14.4 11.5 - 15.5 %   Platelets 264 150 - 400 K/uL   nRBC 0.0 0.0 - 0.2 %   Neutrophils Relative % 77 %   Neutro Abs 5.8 1.7 - 7.7 K/uL   Lymphocytes Relative 13 %   Lymphs Abs 1.0 0.7 - 4.0 K/uL   Monocytes Relative 10 %   Monocytes Absolute 0.8 0.1 - 1.0 K/uL   Eosinophils  Relative 0 %   Eosinophils Absolute 0.0 0.0 - 0.5 K/uL   Basophils Relative 0 %   Basophils Absolute 0.0 0.0 - 0.1 K/uL   Immature Granulocytes 0 %   Abs Immature Granulocytes 0.03 0.00 - 0.07 K/uL    Comment: Performed at [facility], [place] [address], [place], [place] [address]  Type and screen [place]     Status: None (Preliminary result)   Collection Time: 09/14/19  1:14 AM  Result Value Ref Range   ABO/RH(D) O POS    Antibody Screen NEG    Sample Expiration 09/17/2019,2359    Unit Number [id number]    Blood Component Type RBC LR PHER1    Unit division 00    Status of Unit ISSUED    Transfusion Status OK TO TRANSFUSE    Crossmatch Result      Compatible Performed at [facility], [place] [address] [place], [place] [address]    Unit Number [id number]    Blood Component Type RED CELLS,LR    Unit division 00    Status of Unit ISSUED    Transfusion Status OK TO TRANSFUSE    Crossmatch Result Compatible   Prepare RBC     Status: None   Collection Time: 09/14/19  1:14 AM  Result Value Ref Range   Order Confirmation      ORDER PROCESSED BY BLOOD BANK Performed at [facility], [place] [address], [place], [place] [address]   Magnesium     Status: None   Collection Time: 09/14/19  1:14 AM  Result Value Ref Range   Magnesium 2.3 1.7 - 2.4 mg/dL    Comment: Performed at [facility], [place] [address] [place], [place] [address]  Phosphorus     Status: None   Collection Time: 09/14/19  1:14 AM  Result Value Ref Range    Phosphorus 3.4 2.5 - 4.6 mg/dL    Comment: Performed at [facility], [place] [address], [place], [place] [address]  Hepatic function panel     Status: Abnormal   Collection Time: 09/14/19  1:14 AM  Result Value Ref Range   Total Protein 6.4 (L) 6.5 - 8.1 g/dL   Albumin 3.8 3.5 - 5.0 g/dL   AST 28 15 - 41 U/L   ALT 24 0 - 44 U/L   Alkaline Phosphatase 61 38 - 126 U/L   Total Bilirubin 0.5 0.3 - 1.2 mg/dL   Bilirubin, Direct 0.1 0.0 - 0.2 mg/dL   Indirect Bilirubin 0.4 0.3 - 0.9 mg/dL    Comment: Performed at [facility], [place] [address] [place], [place] [address]  HIV Antibody (routine testing w rflx)     Status: None   Collection Time: 09/14/19  2:43 AM  Result Value Ref Range   HIV Screen 4th Generation wRfx NON REACTIVE NON REACTIVE    Comment: Performed at [place] [address], [place] [address]  CBC     Status: Abnormal   Collection Time: 09/14/19  2:43 AM  Result Value Ref Range   WBC 7.5 4.0 - 10.5 K/uL   RBC 2.10 (L) 4.22 - 5.81 MIL/uL   Hemoglobin 5.7 (LL) 13.0 - 17.0 g/dL    Comment: REPEATED TO VERIFY CRITICAL VALUE NOTED.  VALUE IS CONSISTENT WITH PREVIOUSLY REPORTED AND CALLED VALUE.    HCT 19.6 (L) 39.0 - 52.0 %   MCV 93.3 80.0 - 100.0 fL  MCH 27.1 26.0 - 34.0 pg   MCHC 29.1 (L) 30.0 - 36.0 g/dL   RDW 14.3 11.5 - 15.5 %   Platelets 247 150 - 400 K/uL   nRBC 0.0 0.0 - 0.2 %    Comment: Performed at [facility], [place] [address], [place], [place] [address]   Recent Results (from the past 240 hour(s))  SARS CORONAVIRUS 2 (TAT 6-24 HRS) Nasopharyngeal Nasopharyngeal Swab     Status: None   Collection Time: 09/12/19 11:18 PM   Specimen: Nasopharyngeal Swab  Result Value Ref Range Status   SARS Coronavirus 2 NEGATIVE NEGATIVE Final    Comment: (NOTE) SARS-CoV-2 target nucleic acids are NOT DETECTED. The SARS-CoV-2 RNA is generally detectable in upper and lower respiratory specimens  during the acute phase of infection. Negative results do not preclude SARS-CoV-2 infection, do not rule out co-infections with other pathogens, and should not be used as the sole basis for treatment or other patient management decisions. Negative results must be combined with clinical observations, patient history, and epidemiological information. The expected result is Negative. Fact Sheet for Patients: SugarRoll.be Fact Sheet for Healthcare Providers: https://www.woods-mathews.com/ This test is not yet approved or cleared by the [place] FDA and  has been authorized for detection and/or diagnosis of SARS-CoV-2 by FDA under an Emergency Use Authorization (EUA). This EUA will remain  in effect (meaning this test can be used) for the duration of the COVID-19 declaration under Section 56 4(b)(1) of the Act, 21 U.S.C. section 360bbb-3(b)(1), unless the authorization is terminated or revoked sooner. Performed at [facility], [place] [address] [place], [place] [address]    Creatinine: Recent Labs    09/12/19 2314 09/14/19 0029 09/14/19 0114  CREATININE 1.11 1.00 0.99    Xrays: See report/chart   Impression/Assessment:  I irrigated the catheter for mild clots.  His abdomen was very flat before and after.  Very minimal clots and modest hematuria.  Plan:  The plan is to transfuse the patient and make certain his hemoglobin is reasonably stable.  He is to have hyperbaric oxygen tomorrow at 1 PM but must be an outpatient.  We called over.  We will check on the type a catheter because some manufacturers apparently the hyperbaric may be an issue.  The nursing staff will call over.  I recommend hopefully for the patient to be discharged home first thing in the morning with the catheter and to follow-up with Dr. Tresa Moore in hyperbaric  Silvio Sausedo A Kiaja Shorty 09/14/2019, 12:20 PM

## 2019-09-14 NOTE — ED Provider Notes (Signed)
[facility] DEPT Provider Note   CSN: [id number] Arrival date & time: 09/13/19  2214     History   Chief Complaint Chief Complaint  Patient presents with  . Hematuria    HPI [name] [name] is a 63 y.o. male.     HPI Multiple medical issues, most notably radiation cystitis, indwelling catheter presents with inability to urinate. Notably, patient seen here yesterday, overnight, had irrigation of his catheter, and on discharge had production. However, about 5 hours prior to my evaluation the patient noticed that he had no more urine production. He has had some lower abdominal discomfort, no other pain, including chest discomfort, no dyspnea or fever, no vomiting, no diarrhea. Today, since yesterday's ED visit he has seen his urologist, has an appointment scheduled for 1 week from now, and is currently being evaluated for consideration of hyperbaric therapy for his radiation cystitis.  Past Medical History:  Diagnosis Date  . Foley catheter in place    placed 07-30-2019  . GERD (gastroesophageal reflux disease)   . History of kidney stones   . Hypertension   . Prostate cancer ([place]) 2015  . Urinary retention     Patient Active Problem List   Diagnosis Date Noted  . Hematuria 08/19/2019  . Malignant neoplasm of prostate ([place]) 07/12/2014    Past Surgical History:  Procedure Laterality Date  . CYSTOSCOPY N/A 08/21/2019   Procedure: CYSTOSCOPY/CLOT EVACUATION AND FULGURATION 2-5 CENTIMETERS;  Surgeon: Festus Aloe, MD;  Location: [facility];  Service: Urology;  Laterality: N/A;  . CYSTOSCOPY WITH FULGERATION N/A 08/05/2019   Procedure: CYSTOSCOPY CLOT EVACUATION WITH FULGERATION;  Surgeon: Alexis Frock, MD;  Location: [facility];  Service: Urology;  Laterality: N/A;  . left bicep surgery     reattached  . LYMPHADENECTOMY Bilateral 07/12/2014   Procedure: PELVIC LYMPH NODE DISSECTION;  Surgeon: Alexis Frock, MD;  Location:  [facility];  Service: Urology;  Laterality: Bilateral;  . mucous seal  age 29   removed from lip  . ROBOT ASSISTED LAPAROSCOPIC RADICAL PROSTATECTOMY N/A 07/12/2014   Procedure: ROBOTIC ASSISTED LAPAROSCOPIC RADICAL PROSTATECTOMY , INDOCYANINE GREEN DYE INJECTION;  Surgeon: Alexis Frock, MD;  Location: [facility];  Service: Urology;  Laterality: N/A;  3.5 HRS         Home Medications    Prior to Admission medications   Medication Sig Start Date End Date Taking? Authorizing Provider  LEUPROLIDE ACETATE, 6 MONTH, 45 MG injection Inject 45 mg into the skin See admin instructions. Every 6 months 08/23/19  Yes [provider]  lisinopril-hydrochlorothiazide (ZESTORETIC) 10-12.5 MG tablet Take 1 tablet by mouth daily.   Yes [provider]  loratadine (CLARITIN) 10 MG tablet Take 10 mg by mouth daily.   Yes [provider]  omeprazole (PRILOSEC OTC) 20 MG tablet Take 20 mg by mouth daily.   Yes [provider]  venlafaxine XR (EFFEXOR-XR) 75 MG 24 hr capsule Take 75 mg by mouth daily with breakfast.   Yes [provider]    Family History Family History  Problem Relation Age of Onset  . Heart disease Mother   . Cancer Father        throat  . Cancer Paternal Grandfather        ? prostate cancer    Social History Social History   Tobacco Use  . Smoking status: Never Smoker  . Smokeless tobacco: Former Systems developer    Types: Chew  Substance Use Topics  . Alcohol  use: Yes    Alcohol/week: 2.0 standard drinks    Types: 2 Cans of beer per week    Comment: drinks daily 2 beers or drinks  . Drug use: Yes    Types: Marijuana    Comment: last used marijuana 2 3 weeks ago as of 08-02-2019     Allergies   Tetracyclines & related   Review of Systems Review of Systems  Constitutional:       Per HPI, otherwise negative  HENT:       Per HPI, otherwise negative  Respiratory:       Per HPI, otherwise negative  Cardiovascular:       Per HPI, otherwise  negative  Gastrointestinal: Negative for vomiting.  Endocrine:       Negative aside from HPI  Genitourinary:       Neg aside from HPI   Musculoskeletal:       Per HPI, otherwise negative  Skin: Negative.   Allergic/Immunologic: Positive for immunocompromised state.  Neurological: Negative for syncope.     Physical Exam Updated Vital Signs BP (!) 120/59   Pulse 79   Temp 98.9 F (37.2 C) (Oral)   Resp 18   Ht 5\' 8"  (1.727 m)   Wt 84.4 kg   SpO2 98%   BMI 28.28 kg/m   Physical Exam Vitals signs and nursing note reviewed.  Constitutional:      General: He is not in acute distress.    Appearance: He is well-developed.  HENT:     Head: Normocephalic and atraumatic.  Eyes:     Conjunctiva/sclera: Conjunctivae normal.  Cardiovascular:     Rate and Rhythm: Normal rate and regular rhythm.  Pulmonary:     Effort: Pulmonary effort is normal. No respiratory distress.     Breath sounds: No stridor.  Abdominal:     General: There is no distension.  Genitourinary:    Comments: Unremarkable aside from Foley catheter exiting, no tenderness.  Urine in bag is minimal initially, but after irrigation is grossly bloody, then clear. Skin:    General: Skin is warm and dry.  Neurological:     Mental Status: He is alert and oriented to person, place, and time.      ED Treatments / Results  Labs (all labs ordered are listed, but only abnormal results are displayed) Labs Reviewed  BASIC METABOLIC PANEL  CBC WITH DIFFERENTIAL/PLATELET  MAGNESIUM  PHOSPHORUS  HEPATIC FUNCTION PANEL  I-STAT CHEM 8, ED  TYPE AND SCREEN  PREPARE RBC (CROSSMATCH)    Radiology Dg Chest 2 View  Result Date: 09/13/2019 CLINICAL DATA:  Radiation cystitis. Evaluation for hyperbaric oxygen therapy. EXAM: CHEST - 2 VIEW COMPARISON:  None. FINDINGS: The heart size and mediastinal contours are within normal limits. Aortic atherosclerosis. Both lungs are clear. The visualized skeletal structures are  unremarkable. IMPRESSION: No active cardiopulmonary disease. Electronically Signed   By: Marlaine Hind M.D.   On: 09/13/2019 16:43    Procedures Procedures (including critical care time)  Medications Ordered in ED Medications  0.9 %  sodium chloride infusion (has no administration in time range)     Initial Impression / Assessment and Plan / ED Course  I have reviewed the triage vital signs and the nursing notes.  Pertinent labs & imaging results that were available during my care of the patient were reviewed by me and considered in my medical decision making (see chart for details).    Patient's initial vital signs unremarkable, but given concern  for ongoing bleeding, after discussion with his wife who joined him after my initial evaluation, patient had labs performed, initial labs notable for hemoglobin 6.1. This is a notable decrease compared to yesterday's value of greater than 7. With this consideration, the patient had type and screen, transfusion is being prepared. He and his wife are aware of all findings.    This generally well-appearing male, though with a history of prior prostate malignancy, and now with known radiation cystitis presents with hematuria, critically abnormal hemoglobin value. Patient is awake and alert, but with concern for ongoing bleeding, patient will require transfusion, admission for further monitoring, management. Covid test sent yesterday, results not yet available.  Final Clinical Impressions(s) / ED Diagnoses   Final diagnoses:  Symptomatic anemia  Gross hematuria   CRITICAL CARE Performed by: Carmin Muskrat Total critical care time: 40 minutes Critical care time was exclusive of separately billable procedures and treating other patients. Critical care was necessary to treat or prevent imminent or life-threatening deterioration. Critical care was time spent personally by me on the following activities: development of treatment plan with  patient and/or surrogate as well as nursing, discussions with consultants, evaluation of patient's response to treatment, examination of patient, obtaining history from patient or surrogate, ordering and performing treatments and interventions, ordering and review of laboratory studies, ordering and review of radiographic studies, pulse oximetry and re-evaluation of patient's condition.    Carmin Muskrat, MD 09/14/19 [phone]

## 2019-09-14 NOTE — H&P (Signed)
History and Physical    [name] [name] [id number] DOB: 1956-01-17 DOA: 09/13/2019  PCP: [name] Melter, MD   Patient coming from: Home.  I have personally briefly reviewed patient's old medical records in [place]  Chief Complaint: Clogged Foley.  HPI: [name] [name] is a 63 y.o. male with medical history significant GERD, urolithiasis, hypertension, prostate cancer, history of urinary retention who is coming to the emergency department due to having a clogged Foley catheter, which he had placed earlier in the morning yesterday due to hematuria secondary to radiation cystitis.  The patient states he went home and fell asleep for most of the day.  He attributes the Foley getting clogged with clots due to him not drinking water as usual.  He denies flank pain, fever, chills, but feels fatigued.  He states that he feels winded going up the stairs, has had palpitations and mild dizziness.  He denies chest pain, diaphoresis, PND, orthopnea or pitting edema of the lower extremities.  No abdominal pain, diarrhea, melena or hematochezia.  He occasionally gets constipated.  He occasionally gets briefly nauseous, but no emesis.  He is a scheduled to go for hyperbaric therapy next week.  ED Course: Initial vital signs temperature 98.9 F, pulse 101, respiration 20, blood pressure 143/77 mmHg and O2 sat 100% on room air.  A 2 unit PRBC transfusion was started in the emergency department.  White count 7.6, hemoglobin 6.2 g/dL and platelets 264.  His BMP shows a sodium level of 133 mmol/L.  Glucose 103 and calcium 8.7 mg/dL.  Calcium is normal when corrected to albumin.  The rest of the values are within normal limits.  LFTs show total protein of 6.4 g/dL.  The rest of the LFTs are within normal limits.  Review of Systems: As per HPI otherwise 10 point review of systems negative.   Past Medical History:  Diagnosis Date  . Foley catheter in place    placed 07-30-2019  . GERD (gastroesophageal  reflux disease)   . History of kidney stones   . Hypertension   . Prostate cancer ([name] [name]) 2015  . Urinary retention     Past Surgical History:  Procedure Laterality Date  . CYSTOSCOPY N/A 08/21/2019   Procedure: CYSTOSCOPY/CLOT EVACUATION AND FULGURATION 2-5 CENTIMETERS;  Surgeon: Festus Aloe, MD;  Location: [facility];  Service: Urology;  Laterality: N/A;  . CYSTOSCOPY WITH FULGERATION N/A 08/05/2019   Procedure: CYSTOSCOPY CLOT EVACUATION WITH FULGERATION;  Surgeon: Alexis Frock, MD;  Location[facility];  Service: Urology;  Laterality: N/A;  . left bicep surgery     reattached  . LYMPHADENECTOMY Bilateral 07/12/2014   Procedure: PELVIC LYMPH NODE DISSECTION;  Surgeon: Alexis Frock, MD;  Location: [facility];  Service: Urology;  Laterality: Bilateral;  . mucous seal  age 21   removed from lip  . ROBOT ASSISTED LAPAROSCOPIC RADICAL PROSTATECTOMY N/A 07/12/2014   Procedure: ROBOTIC ASSISTED LAPAROSCOPIC RADICAL PROSTATECTOMY , INDOCYANINE GREEN DYE INJECTION;  Surgeon: Alexis Frock, MD;  Location: [facility];  Service: Urology;  Laterality: N/A;  3.5 HRS      reports that he has never smoked. He quit smokeless tobacco use about 25 years ago.  His smokeless tobacco use included chew. He reports current alcohol use of about 2.0 standard drinks of alcohol per week. He reports current drug use. Drug: Marijuana.  Allergies  Allergen Reactions  . Tetracyclines & Related Rash    Family History  Problem Relation Age of Onset  . Heart  disease Mother   . Cancer Father        throat  . Cancer Paternal Grandfather        ? prostate cancer   Prior to Admission medications   Medication Sig Start Date End Date Taking? Authorizing Provider  LEUPROLIDE ACETATE, 6 MONTH, 45 MG injection Inject 45 mg into the skin See admin instructions. Every 6 months 08/23/19  Yes [provider]  lisinopril-hydrochlorothiazide (ZESTORETIC) 10-12.5 MG tablet Take 1 tablet by mouth daily.    Yes [provider]  loratadine (CLARITIN) 10 MG tablet Take 10 mg by mouth daily.   Yes [provider]  omeprazole (PRILOSEC OTC) 20 MG tablet Take 20 mg by mouth daily.   Yes [provider]  venlafaxine XR (EFFEXOR-XR) 75 MG 24 hr capsule Take 75 mg by mouth daily with breakfast.   Yes [provider]    Physical Exam: Vitals:   09/13/19 2232 09/14/19 0031  BP: (!) 143/77 (!) 120/59  Pulse: (!) 101 79  Resp: 20 18  Temp: 98.9 F (37.2 C)   TempSrc: Oral   SpO2: 100% 98%  Weight: 84.4 kg   Height: 5\' 8"  (1.727 m)     Constitutional: NAD, calm, comfortable Eyes: PERRL, lids and conjunctivae are pale. ENMT: Mucous membranes are moist. Posterior pharynx clear of any exudate or lesions. Neck: normal, supple, no masses, no thyromegaly Respiratory: clear to auscultation bilaterally, no wheezing, no crackles. Normal respiratory effort. No accessory muscle use.  Cardiovascular: Regular rate and rhythm with occasional extrasystole, no murmurs / rubs / gallops. No extremity edema. 2+ pedal pulses. No carotid bruits.  Abdomen: no tenderness, no masses palpated. No hepatosplenomegaly. Bowel sounds positive.  Musculoskeletal: no clubbing / cyanosis. Good ROM, no contractures. Normal muscle tone.  Skin: Looks pale, particularly on his palms and eyelids. Neurologic: CN 2-12 grossly intact. Sensation intact, DTR normal. Strength 5/5 in all 4.  Psychiatric: Normal judgment and insight. Alert and oriented x 3. Normal mood.   Labs on Admission: I have personally reviewed following labs and imaging studies  CBC: Recent Labs  Lab 09/12/19 2314  WBC 10.0  NEUTROABS 7.9*  HGB 7.5*  HCT 23.1*  MCV 88.8  PLT 0000000   Basic Metabolic Panel: Recent Labs  Lab 09/12/19 2314  NA 129*  K 3.5  CL 95*  CO2 18*  GLUCOSE 133*  BUN 16  CREATININE 1.11  CALCIUM 9.1   GFR: Estimated Creatinine Clearance: 73.9 mL/min (by C-G formula based on SCr of 1.11  mg/dL). Liver Function Tests: No results for input(s): AST, ALT, ALKPHOS, BILITOT, PROT, ALBUMIN in the last 168 hours. No results for input(s): LIPASE, AMYLASE in the last 168 hours. No results for input(s): AMMONIA in the last 168 hours. Coagulation Profile: No results for input(s): INR, PROTIME in the last 168 hours. Cardiac Enzymes: No results for input(s): CKTOTAL, CKMB, CKMBINDEX, TROPONINI in the last 168 hours. BNP (last 3 results) No results for input(s): PROBNP in the last 8760 hours. HbA1C: No results for input(s): HGBA1C in the last 72 hours. CBG: No results for input(s): GLUCAP in the last 168 hours. Lipid Profile: No results for input(s): CHOL, HDL, LDLCALC, TRIG, CHOLHDL, LDLDIRECT in the last 72 hours. Thyroid Function Tests: No results for input(s): TSH, T4TOTAL, FREET4, T3FREE, THYROIDAB in the last 72 hours. Anemia Panel: No results for input(s): VITAMINB12, FOLATE, FERRITIN, TIBC, IRON, RETICCTPCT in the last 72 hours.  Radiological Exams on Admission: Dg Chest 2 View  Result  Date: 09/13/2019 CLINICAL DATA:  Radiation cystitis. Evaluation for hyperbaric oxygen therapy. EXAM: CHEST - 2 VIEW COMPARISON:  None. FINDINGS: The heart size and mediastinal contours are within normal limits. Aortic atherosclerosis. Both lungs are clear. The visualized skeletal structures are unremarkable. IMPRESSION: No active cardiopulmonary disease. Electronically Signed   By: Marlaine Hind M.D.   On: 09/13/2019 16:43    EKG: Independently reviewed.  Assessment/Plan Principal Problem:   Symptomatic anemia Observation/MedSurg. Transfuse 2 units of PRBC. Check posttransfusion CBC.  Active Problems:   Hematuria Scheduled to see urology next week. Hyperbaric oxygen being planned as an outpatient. We will discuss with urology in a.m.    Hypertension Continue lisinopril 10 mg p.o. daily. Continue HCTZ 12.5 mg p.o. daily. Monitor BP, renal function electrolytes.    GERD  (gastroesophageal reflux disease) Continue PPI    DVT prophylaxis: SCDs. Code Status: Full code. Family Communication:  Disposition Plan: Observation for blood transfusion. Consults called: Admission status: Observation/telemetry.  Reubin Milan MD [facility]  If 7PM-7AM, please contact night-coverage [email or web address] Password [facility]  09/14/2019, 1:12 AM   This document was prepared using Dragon voice recognition software and may contain some unintended transcription errors.

## 2019-09-14 NOTE — Progress Notes (Signed)
RN called materials for three way catheter manufacturer. Manufacturer is [employer] representative tried calling wound center to let them know unable to get an answer.

## 2019-09-14 NOTE — Progress Notes (Signed)
CRITICAL VALUE ALERT  Critical Value: Hgb 6.2  Date & Time Notied:09/14/2019;  0221  Provider Notified:  F.Olevia Bowens ,MD  Orders Received/Actions taken: awaiting for blood to be ready. We will continue to monitor.

## 2019-09-15 DIAGNOSIS — D649 Anemia, unspecified: Secondary | ICD-10-CM

## 2019-09-15 LAB — BPAM RBC
Blood Product Expiration Date: 202012092359
Blood Product Expiration Date: 202012092359
ISSUE DATE / TIME: 202011110318
ISSUE DATE / TIME: 202011110736
Unit Type and Rh: 5100
Unit Type and Rh: 5100

## 2019-09-15 LAB — CBC
HCT: 28.7 % — ABNORMAL LOW (ref 39.0–52.0)
Hemoglobin: 9 g/dL — ABNORMAL LOW (ref 13.0–17.0)
MCH: 28.6 pg (ref 26.0–34.0)
MCHC: 31.4 g/dL (ref 30.0–36.0)
MCV: 91.1 fL (ref 80.0–100.0)
Platelets: 231 10*3/uL (ref 150–400)
RBC: 3.15 MIL/uL — ABNORMAL LOW (ref 4.22–5.81)
RDW: 14.6 % (ref 11.5–15.5)
WBC: 6.6 10*3/uL (ref 4.0–10.5)
nRBC: 0 % (ref 0.0–0.2)

## 2019-09-15 LAB — TYPE AND SCREEN
ABO/RH(D): O POS
Antibody Screen: NEGATIVE
Unit division: 0
Unit division: 0

## 2019-09-15 NOTE — Discharge Summary (Signed)
Physician Discharge Summary  KODIE ANGST [id number] DOB: 05-27-1956 DOA: 09/13/2019  PCP: Orpah Melter, MD  Admit date: 09/13/2019 Discharge date: 09/15/2019  Admitted From: Home Disposition: Home  Recommendations for Outpatient Follow-up:  1. Follow up with PCP in 1-2 weeks 2. Please obtain BMP/CBC in one week 3. Please follow up on the following pending results:  Home Health: No Equipment/Devices: Foley's catheter Discharge Condition: Stable CODE STATUS: Full Diet recommendation: Regular  Brief/Interim Summary: Styles Hennessee Chandleris a 63 y.o.malewith medical history significantGERD, urolithiasis, hypertension, prostate cancer, history of urinary retention who is coming to the emergency department due to having a cloggedFoley catheter, found to have gross hematuria and hemoglobin of 5.7. 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  He is a known patient with urology with multiple recent visits due to gross hematuria.  He was given 2 units of packed RBCs and hemoglobin was 9 on discharge. Urology was consulted and they recommended to discharge him today early morning so they can start him on hyperbaric oxygen therapy as an outpatient.  Discharge Diagnoses:  Principal Problem:   Symptomatic anemia Active Problems:   Hematuria   Hypertension   GERD (gastroesophageal reflux disease)  Discharge Instructions  Discharge Instructions    Diet - low sodium heart healthy   Complete by: As directed    Discharge instructions   Complete by: As directed    It was pleasure taking care of you. Please follow-up with urology according to your scheduled appointment for hyperbaric oxygen therapy today. Follow-up with your oncologist according to your scheduled appointment.   Increase activity slowly   Complete by: As directed      Allergies as of 09/15/2019      Reactions   Tetracyclines & Related Rash       Medication List    TAKE these medications   leuprolide acetate (6 Month) 45 MG injection Generic drug: leuprolide (6 Month) Inject 45 mg into the skin See admin instructions. Every 6 months   lisinopril-hydrochlorothiazide 10-12.5 MG tablet Commonly known as: ZESTORETIC Take 1 tablet by mouth daily.   loratadine 10 MG tablet Commonly known as: CLARITIN Take 10 mg by mouth daily.   omeprazole 20 MG tablet Commonly known as: PRILOSEC OTC Take 20 mg by mouth daily.   venlafaxine XR 75 MG 24 hr capsule Commonly known as: EFFEXOR-XR Take 75 mg by mouth daily with breakfast.       Allergies  Allergen Reactions  . Tetracyclines & Related Rash    Consultations:  Urology  Procedures/Studies: Dg Chest 2 View  Result Date: 09/13/2019 CLINICAL DATA:  Radiation cystitis. Evaluation for hyperbaric oxygen therapy. EXAM: CHEST - 2 VIEW COMPARISON:  None. FINDINGS: The heart size and mediastinal contours are within normal limits. Aortic atherosclerosis. Both lungs are clear. The visualized skeletal structures are unremarkable. IMPRESSION: No active cardiopulmonary disease. Electronically Signed   By: Marlaine Hind M.D.   On: 09/13/2019 16:43   US Pelvis Limited (transabdominal Only)  Result Date: 08/20/2019 CLINICAL DATA:  Gross hematuria. EXAM: LIMITED ULTRASOUND OF PELVIS TECHNIQUE: Limited transabdominal ultrasound examination of the pelvis was performed. COMPARISON:  None. FINDINGS: Foley catheter seen in the decompressed bladder. Rounded increased echogenicity superior to the Foley catheter balloon is identified. IMPRESSION: 1. Foley catheter balloon is seen in the decompressed bladder. 2. Rounded increased echogenicity superior to the Foley catheter balloon could simply represent blood products given history of gross hematuria. This study cannot exclude  a mass in the bladder. Electronically Signed   By: Dorise Bullion III M.D   On: 08/20/2019 12:59    Subjective: Patient was  feeling better when seen this morning.  No new complaints.  Continues to have hematuria with stable hemoglobin.  Discharge Exam: Vitals:   09/14/19 2133 09/15/19 0617  BP: 129/78 116/65  Pulse: 72 66  Resp: 18 17  Temp: 98.7 F (37.1 C) 98.2 F (36.8 C)  SpO2: 97% 98%   Vitals:   09/14/19 1030 09/14/19 1353 09/14/19 2133 09/15/19 0617  BP: (!) 106/57 115/74 129/78 116/65  Pulse: 74 71 72 66  Resp: 16 18 18 17   Temp: 98.5 F (36.9 C) 98.2 F (36.8 C) 98.7 F (37.1 C) 98.2 F (36.8 C)  TempSrc: Oral  Oral Oral  SpO2: 100% 100% 97% 98%  Weight:      Height:        General: Pt is alert, awake, not in acute distress Cardiovascular: RRR, S1/S2 +, no rubs, no gallops Respiratory: CTA bilaterally, no wheezing, no rhonchi Abdominal: Soft, NT, ND, bowel sounds + Extremities: no edema, no cyanosis   The results of significant diagnostics from this hospitalization (including imaging, microbiology, ancillary and laboratory) are listed below for reference.     Microbiology: Recent Results (from the past 240 hour(s))  SARS CORONAVIRUS 2 (TAT 6-24 HRS) Nasopharyngeal Nasopharyngeal Swab     Status: None   Collection Time: 09/12/19 11:18 PM   Specimen: Nasopharyngeal Swab  Result Value Ref Range Status   SARS Coronavirus 2 NEGATIVE NEGATIVE Final    Comment: (NOTE) SARS-CoV-2 target nucleic acids are NOT DETECTED. The SARS-CoV-2 RNA is generally detectable in upper and lower respiratory specimens during the acute phase of infection. Negative results do not preclude SARS-CoV-2 infection, do not rule out co-infections with other pathogens, and should not be used as the sole basis for treatment or other patient management decisions. Negative results must be combined with clinical observations, patient history, and epidemiological information. The expected result is Negative. Fact Sheet for Patients: SugarRoll.be Fact Sheet for Healthcare  Providers: https://www.woods-mathews.com/ This test is not yet approved or cleared by the [place] FDA and  has been authorized for detection and/or diagnosis of SARS-CoV-2 by FDA under an Emergency Use Authorization (EUA). This EUA will remain  in effect (meaning this test can be used) for the duration of the COVID-19 declaration under Section 56 4(b)(1) of the Act, 21 U.S.C. section 360bbb-3(b)(1), unless the authorization is terminated or revoked sooner. Performed at [facility], [place] [address], [place], [place] [address]      Labs: BNP (last 3 results) No results for input(s): BNP in the last 8760 hours. Basic Metabolic Panel: Recent Labs  Lab 09/12/19 2314 09/14/19 0029 09/14/19 0114  NA 129* 134* 133*  K 3.5 3.8 3.6  CL 95* 99 102  CO2 18*  --  22  GLUCOSE 133* 99 103*  BUN 16 18 20   CREATININE 1.11 1.00 0.99  CALCIUM 9.1  --  8.7*  MG  --   --  2.3  PHOS  --   --  3.4   Liver Function Tests: Recent Labs  Lab 09/14/19 0114  AST 28  ALT 24  ALKPHOS 61  BILITOT 0.5  PROT 6.4*  ALBUMIN 3.8   No results for input(s): LIPASE, AMYLASE in the last 168 hours. No results for input(s): AMMONIA in the last 168 hours. CBC: Recent Labs  Lab 09/12/19 2314 09/14/19 0029  09/14/19 0114 09/14/19 0243 09/14/19 1310 09/15/19 0246  WBC 10.0  --  7.6 7.5  --  6.6  NEUTROABS 7.9*  --  5.8  --   --   --   HGB 7.5* 6.1* 6.2* 5.7* 8.7* 9.0*  HCT 23.1* 18.0* 20.7* 19.6* 27.9* 28.7*  MCV 88.8  --  92.8 93.3  --  91.1  PLT 385  --  264 247  --  231   Cardiac Enzymes: No results for input(s): CKTOTAL, CKMB, CKMBINDEX, TROPONINI in the last 168 hours. BNP: Invalid input(s): POCBNP CBG: No results for input(s): GLUCAP in the last 168 hours. D-Dimer No results for input(s): DDIMER in the last 72 hours. Hgb A1c No results for input(s): HGBA1C in the last 72 hours. Lipid Profile No results for input(s): CHOL, HDL, LDLCALC, TRIG, CHOLHDL, LDLDIRECT  in the last 72 hours. Thyroid function studies No results for input(s): TSH, T4TOTAL, T3FREE, THYROIDAB in the last 72 hours.  Invalid input(s): FREET3 Anemia work up No results for input(s): VITAMINB12, FOLATE, FERRITIN, TIBC, IRON, RETICCTPCT in the last 72 hours. Urinalysis    Component Value Date/Time   COLORURINE RED (A) 08/19/2019 2100   APPEARANCEUR TURBID (A) 08/19/2019 2100   LABSPEC  08/19/2019 2100    TEST NOT REPORTED DUE TO COLOR INTERFERENCE OF URINE PIGMENT   PHURINE  08/19/2019 2100    TEST NOT REPORTED DUE TO COLOR INTERFERENCE OF URINE PIGMENT   GLUCOSEU (A) 08/19/2019 2100    TEST NOT REPORTED DUE TO COLOR INTERFERENCE OF URINE PIGMENT   HGBUR NEGATIVE 08/19/2019 2100   BILIRUBINUR (A) 08/19/2019 2100    TEST NOT REPORTED DUE TO COLOR INTERFERENCE OF URINE PIGMENT   KETONESUR (A) 08/19/2019 2100    TEST NOT REPORTED DUE TO COLOR INTERFERENCE OF URINE PIGMENT   PROTEINUR (A) 08/19/2019 2100    TEST NOT REPORTED DUE TO COLOR INTERFERENCE OF URINE PIGMENT   NITRITE (A) 08/19/2019 2100    TEST NOT REPORTED DUE TO COLOR INTERFERENCE OF URINE PIGMENT   LEUKOCYTESUR (A) 08/19/2019 2100    TEST NOT REPORTED DUE TO COLOR INTERFERENCE OF URINE PIGMENT   Sepsis Labs Invalid input(s): PROCALCITONIN,  WBC,  LACTICIDVEN Microbiology Recent Results (from the past 240 hour(s))  SARS CORONAVIRUS 2 (TAT 6-24 HRS) Nasopharyngeal Nasopharyngeal Swab     Status: None   Collection Time: 09/12/19 11:18 PM   Specimen: Nasopharyngeal Swab  Result Value Ref Range Status   SARS Coronavirus 2 NEGATIVE NEGATIVE Final    Comment: (NOTE) SARS-CoV-2 target nucleic acids are NOT DETECTED. The SARS-CoV-2 RNA is generally detectable in upper and lower respiratory specimens during the acute phase of infection. Negative results do not preclude SARS-CoV-2 infection, do not rule out co-infections with other pathogens, and should not be used as the sole basis for treatment or other patient  management decisions. Negative results must be combined with clinical observations, patient history, and epidemiological information. The expected result is Negative. Fact Sheet for Patients: SugarRoll.be Fact Sheet for Healthcare Providers: https://www.woods-mathews.com/ This test is not yet approved or cleared by the [place] FDA and  has been authorized for detection and/or diagnosis of SARS-CoV-2 by FDA under an Emergency Use Authorization (EUA). This EUA will remain  in effect (meaning this test can be used) for the duration of the COVID-19 declaration under Section 56 4(b)(1) of the Act, 21 U.S.C. section 360bbb-3(b)(1), unless the authorization is terminated or revoked sooner. Performed at [facility], [place],  [place] [address]     Time coordinating discharge: Over 30 minutes  SIGNED:  Lorella Nimrod, MD  [facility] 09/15/2019, 8:11 AM Pager [phone]  If 7PM-7AM, please contact night-coverage [email or web address] Password [email or web address]  This record has been created using Systems analyst. Errors have been sought and corrected,but may not always be located. Such creation errors do not reflect on the standard of care.

## 2019-09-19 ENCOUNTER — Encounter (HOSPITAL_BASED_OUTPATIENT_CLINIC_OR_DEPARTMENT_OTHER): Payer: Federal, State, Local not specified - PPO | Admitting: Internal Medicine

## 2019-09-19 ENCOUNTER — Other Ambulatory Visit: Payer: Self-pay

## 2019-09-19 DIAGNOSIS — N3041 Irradiation cystitis with hematuria: Secondary | ICD-10-CM | POA: Diagnosis not present

## 2019-09-19 NOTE — Progress Notes (Signed)
AYDN, BOLIVAR ([id number]) Visit Report for 09/13/2019 Abuse/Suicide Risk Screen Details Patient Name: Date of Service: [name] [name], [name] [name] 09/13/2019 9:45 AM Medical Record [id number] Patient Account Number: [id number] Date of Birth/Sex: Treating RN: August 04, 1956 (63 y.o. [name] [name] Primary Care [name] [name]: [name] [name] Other Clinician: Referring [name] [name]: Treating [name] [name]/Extender:[name] [name], [name] [name], [name] [name] in Treatment: 0 Abuse/Suicide Risk Screen Items Answer ABUSE RISK SCREEN: Has anyone close to you tried to hurt or harm you recentlyo No Do you feel uncomfortable with anyone in your familyo No Has anyone forced you do things that you didnt want to doo No Electronic Signature(s) Signed: 09/19/2019 5:59:11 PM By: [name] Hurst RN, BSN Entered By: [name] [name] on 09/13/2019 10:01:11 -------------------------------------------------------------------------------- Activities of Daily Living Details Patient Name: Date of Service: [name] [name], [name] [name] 09/13/2019 9:45 AM Medical Record [id number] Patient Account Number: [id number] Date of Birth/Sex: Treating RN: 1956/06/08 (63 y.o. [name] [name] Primary Care [name] [name]: [name] [name] Other Clinician: Referring [name] [name]: Treating [name] [name]/Extender:[name] [name], [name] [name], [name] [name] in Treatment: 0 Activities of Daily Living Items Answer Activities of Daily Living (Please select one for each item) Drive Automobile Completely Able Take Medications Completely Able Use Telephone Completely Able Care for Appearance Completely Able Use Toilet Completely Able Bath / Shower Completely Able Dress Self Completely Able Feed Self Completely Able Walk Completely Able Get In / Out Bed Completely Able Housework Completely Able Prepare Meals Completely Able Handle Money Completely Able Shop for Self Completely Able Electronic Signature(s) Signed: 09/19/2019 5:59:11 PM By: [name] Hurst  RN, BSN Entered By: [name] [name] on 09/13/2019 10:01:26 -------------------------------------------------------------------------------- Education Screening Details Patient Name: Date of Service: [name] [name] 09/13/2019 9:45 AM Medical Record [id number] Patient Account Number: [id number] Date of Birth/Sex: Treating RN: 08-Jan-1956 (63 y.o. [name] [name] Primary Care [name] [name]: [name] [name] Other Clinician: Referring [name] [name]: Treating [name] [name]/Extender:[name] [name], [name] [name], [name] [name] in Treatment: 0 Primary Learner Assessed: Patient Learning Preferences/Education Level/Primary Language Learning Preference: Explanation, Demonstration, Printed Material Highest Education Level: College or Above Preferred Language: English Cognitive Barrier Language Barrier: No Translator Needed: No Memory Deficit: No Emotional Barrier: No Cultural/Religious Beliefs Affecting Medical Care: No Physical Barrier Impaired Vision: No Impaired Hearing: No Decreased Hand dexterity: No Knowledge/Comprehension Knowledge Level: High Comprehension Level: High Ability to understand written High instructions: Ability to understand verbal High instructions: Motivation Anxiety Level: Calm Cooperation: Cooperative Education Importance: Acknowledges Need Interest in Health Problems: Asks Questions Perception: Coherent Willingness to Engage in Self- High Management Activities: Readiness to Engage in Self- High Management Activities: Electronic Signature(s) Signed: 09/19/2019 5:59:11 PM By: [name] Hurst RN, BSN Entered By: [name] [name] on 09/13/2019 10:01:47 -------------------------------------------------------------------------------- Fall Risk Assessment Details Patient Name: Date of Service: [name] [name] 09/13/2019 9:45 AM Medical Record [id number] Patient Account Number: [id number] Date of Birth/Sex: Treating RN: [birth year] (63 y.o. [name] [name] Primary Care [name] [name]: [name] [name] Other Clinician: Referring [name] [name]: Treating [name] [name]/Extender:[name] [name], [name] [name], [name] [name] in Treatment: 0 Fall Risk Assessment Items Have you had 2 or more falls in the last 12 monthso 0 No Have you had any fall that resulted in injury in the last 12 monthso 0 No FALLS RISK SCREEN History of falling - immediate or within 3 months 0 No Secondary diagnosis (Do you have 2 or more medical diagnoseso) 0 No Ambulatory aid None/bed rest/wheelchair/nurse 0 Yes Crutches/cane/walker 0 No Furniture 0 No Intravenous therapy Access/Saline/Heparin Lock 0 No Weak (short steps with or without shuffle, stooped but able to lift head 0 No  while walking, may seek support from furniture) Impaired (short steps with shuffle, may have difficulty arising from chair, 0 No head down, impaired balance) Mental Status Oriented to own ability 0 Yes Overestimates or forgets limitations 0 No Risk Level: Low Risk Score: 0 Electronic Signature(s) Signed: 09/19/2019 5:59:11 PM By: [name] Hurst RN, BSN Entered By: [name] [name] on 09/13/2019 10:01:59 -------------------------------------------------------------------------------- Nutrition Risk Screening Details Patient Name: Date of Service: [name] [name], [name] [name] 09/13/2019 9:45 AM Medical Record [id number] Patient Account Number: [id number] Date of Birth/Sex: Treating RN: [birth year] (63 y.o. [name] [name] Primary Care [name] [name]: [name] [name] Other Clinician: Referring [name] [name]: Treating [name] [name]/Extender:[name] [name], [name] [name], [name] [name] in Treatment: 0 Height (in): Weight (lbs): Body Mass Index (BMI): Nutrition Risk Screening Items Score Screening NUTRITION RISK SCREEN: I have an illness or condition that made me change the kind and/or 0 No amount of food I eat I eat fewer than two meals per day 0 No I eat few fruits and vegetables, or milk products 0 No I have three or  more drinks of beer, liquor or wine almost every day 0 No I have tooth or mouth problems that make it hard for me to eat 0 No I don't always have enough money to buy the food I need 0 No I eat alone most of the time 0 No I take three or more different prescribed or over-the-counter drugs a day 1 Yes 0 No Without wanting to, I have lost or gained 10 pounds in the last six months I am not always physically able to shop, cook and/or feed myself 0 No Nutrition Protocols Good Risk Protocol 0 No interventions needed Moderate Risk Protocol High Risk Proctocol Risk Level: Good Risk Score: 1 Electronic Signature(s) Signed: 09/19/2019 5:59:11 PM By: [name] Hurst RN, BSN Entered By: [name] [name] on 09/13/2019 10:02:22

## 2019-09-19 NOTE — Progress Notes (Addendum)
[name] [name] ([id number]) Visit Report for 09/19/2019 HBO Details Patient Name: Date of Service: [name] [name], [name] [name] 09/19/2019 1:00 PM Medical Record [id number] Patient Account Number: [id number] Date of Birth/Sex: Treating RN: 1955/11/08 (63 y.o. [name] [name] Primary Care [name] [name]: [name] [name] Other Clinician: Referring [name] [name]: Treating [name] [name]/Extender:[name] [name], [name] [name] in Treatment: 0 HBO Treatment Course Details Treatment Course Number: 1 Ordering [name] [name]: [name] [name] Total Treatments Ordered: 40 HBO Treatment Start Date: 09/19/2019 HBO Indication: Late Effect of Radiation HBO Treatment Details Treatment Number: 1 Patient Type: Outpatient Chamber Type: Monoplace Chamber Serial #: [id number] Treatment Protocol: 2.5 ATA with 90 minutes oxygen, with two 5 minute air breaks Treatment Details Compression Rate Down: 1.5 psi / minute De-Compression Rate Up: 2.0 psi / minute Air breaks and CompressTx Pressure breathing periods DecompressDecompress Begins Reached (leave unused spaces Begins Ends blank) Chamber Pressure (ATA)1 2.5 2.5 2.5 2.5 2.5 --2.5 1 Clock Time (24 hr) 13:21 13:36 [id number] 15:28 Treatment Length: 127 (minutes) Treatment Segments: 4 Vital Signs Capillary Blood Glucose Reference Range: 80 - 120 mg / dl HBO Diabetic Blood Glucose Intervention Range: <131 mg/dl or >249 mg/dl Time Vitals Blood Respiratory Capillary Blood Glucose Pulse Action Type: Pulse: Temperature: Taken: Pressure: Rate: Glucose (mg/dl): Meter #: Oximetry (%) Taken: Pre 12:50 156/85 70 17 98 Post 15:30 152/83 51 16 98 Treatment Response Treatment Toleration: Well Treatment Completion Treatment Completed without Adverse Event Status: [name] [name] Notes First HBO treatment today. Tympanic membranes were normal. Respiratory and cardiac exams normal. He tolerated treatment well. He is going down to urology after HBO to have  the Foley catheter removed Physician HBO Attestation: I certify that I supervised this HBO treatment in accordance with Medicare guidelines. A trained Yes emergency response team is readily available per hospital policies and procedures. Continue HBOT as ordered. Yes Electronic Signature(s) Signed: 09/19/2019 5:46:17 PM By: [name] Ham MD Entered By: [name] [name] on 09/19/2019 17:45:26 -------------------------------------------------------------------------------- HBO Safety Checklist Details Patient Name: Date of Service: [name] [name] 09/19/2019 1:00 PM Medical Record [id number] Patient Account Number: [id number] Date of Birth/Sex: Treating RN: 1955/11/15 (63 y.o. [name] [name] Primary Care Caeli Linehan: [name] [name] Other Clinician: Referring [name] [name]: Treating [name] [name]/Extender:[name] [name], [name] [name] in Treatment: 0 HBO Safety Checklist Items Safety Checklist Consent Form Signed Patient voided / foley secured and emptied When did you last eato n/a Last dose of injectable or oral agent n/a NA Ostomy pouch emptied and vented if applicable NA All implantable devices assessed, documented and approved NA Intravenous access site secured and place Valuables secured Linens and cotton and cotton/polyester blend (less than 51% polyester) Personal oil-based products / skin lotions / body lotions removed NA Wigs or hairpieces removed NA Smoking or tobacco materials removed Books / newspapers / magazines / loose paper removed Cologne, aftershave, perfume and deodorant removed Jewelry removed (may wrap wedding band) NA Make-up removed Hair care products removed NA Battery operated devices (external) removed NA Heating patches and chemical warmers removed NA Titanium eyewear removed NA Nail polish cured greater than 10 hours NA Casting material cured greater than 10 hours NA Hearing aids removed NA Loose dentures or partials removed NA  Prosthetics have been removed Patient demonstrates correct use of air break device (if applicable) Patient concerns have been addressed Patient grounding bracelet on and cord attached to chamber Specifics for Inpatients (complete in addition to above) Medication sheet sent with patient Intravenous medications needed or due during therapy sent with patient Drainage tubes (e.g. nasogastric tube or  chest tube secured and vented) Endotracheal or Tracheotomy tube secured Cuff deflated of air and inflated with saline Airway suctioned Electronic Signature(s) Signed: 09/19/2019 1:39:52 PM By: [name] [name] EMT/HBOT Entered By: [name] [name] on 09/19/2019 13:39:50

## 2019-09-20 ENCOUNTER — Encounter (HOSPITAL_BASED_OUTPATIENT_CLINIC_OR_DEPARTMENT_OTHER): Payer: Federal, State, Local not specified - PPO | Admitting: Internal Medicine

## 2019-09-20 DIAGNOSIS — N3041 Irradiation cystitis with hematuria: Secondary | ICD-10-CM | POA: Diagnosis not present

## 2019-09-20 NOTE — Progress Notes (Signed)
[name], [name] ([id number]) Visit Report for 09/19/2019 SuperBill Details Patient Name: Date of Service: [name] [name], [name] [name] 09/19/2019 Medical Record [id number] Patient Account Number: [id number] Date of Birth/Sex: Treating RN: 14-Mar-1956 (63 y.o. [name] Primary Care Provider: [name] Other Clinician: Referring Provider: Treating Provider/Extender:[name], [name], [name] in Treatment: 0 Diagnosis Coding ICD-10 Codes Code Description N30.41 Irradiation cystitis with hematuria R31.0 Gross hematuria Z85.46 Personal history of malignant neoplasm of prostate Facility Procedures CPT4 Code Description Modifier Quantity [id number]-(Facility Use Only) HBOT, full body chamber, 74min 4 Physician Procedures CPT4 Code Description Modifier Quantity [id number] - WC PHYS HYPERBARIC OXYGEN THERAPY 1 ICD-10 Diagnosis Description N30.41 Irradiation cystitis with hematuria Electronic Signature(s) Signed: 09/19/2019 5:46:17 PM By: [name] MD Signed: 09/20/2019 4:00:22 PM By: Mikeal Hawthorne EMT/HBOT Entered By: Mikeal Hawthorne on 09/19/2019 15:56:13

## 2019-09-20 NOTE — Progress Notes (Addendum)
[name], [name] ([id number]) Visit Report for 09/20/2019 HBO Details Patient Name: Date of Service: [name], [name] 09/20/2019 1:00 PM Medical Record [id number] Patient Account Number: [id number] Date of Birth/Sex: Treating RN: Jun 02, 1956 (63 y.o. [name]) Carlene Coria Primary Care [name] [name]: [name] Other Clinician: Referring [name] [name]: Treating [name] [name], [name] [name], [name] [name] in Treatment: 1 HBO Treatment Course Details Treatment Course Number: 1 Ordering Cuinn Westerhold: [name] [name] Total Treatments Ordered: 40 HBO Treatment Start Date: 09/19/2019 HBO Indication: Late Effect of Radiation HBO Treatment Details Treatment Number: 2 Patient Type: Outpatient Chamber Type: Monoplace Chamber Serial #: [id number] Treatment Protocol: 2.5 ATA with 90 minutes oxygen, with two 5 minute air breaks Treatment Details Compression Rate Down: 2.0 psi / minute De-Compression Rate Up: 2.0 psi / minute Air breaks and CompressTx Pressure breathing periods DecompressDecompress Begins Reached (leave unused spaces Begins Ends blank) Chamber Pressure (ATA)1 2.5 2.5 2.5 2.5 2.5 --2.5 1 Clock Time (24 hr) 13:00 13:12 13:4213:4714:1714:22--14:52 15:04 Treatment Length: 124 (minutes) Treatment Segments: 4 Vital Signs Capillary Blood Glucose Reference Range: 80 - 120 mg / dl HBO Diabetic Blood Glucose Intervention Range: <131 mg/dl or >249 mg/dl Time Vitals Blood Respiratory Capillary Blood Glucose Pulse Action Type: Pulse: Temperature: Taken: Pressure: Rate: Glucose (mg/dl): Meter #: Oximetry (%) Taken: Pre 12:55 160/84 70 16 97.8 Post 15:07 146/88 75 15 98 Treatment Response Treatment Toleration: Well Treatment Completion Treatment Completed without Adverse Event Status: Paradise Vensel Notes No concerns with treatment given Physician HBO Attestation: I certify that I supervised this HBO treatment in accordance with Medicare guidelines. A  trained Yes Yes emergency response team is readily available per hospital policies and procedures. Continue HBOT as ordered. Yes Electronic Signature(s) Signed: 09/20/2019 6:07:35 PM By: [name] Ham MD Previous Signature: 09/20/2019 4:00:22 PM Version By: Mikeal Hawthorne EMT/HBOT Entered By: [name] [name] on 09/20/2019 18:06:39 -------------------------------------------------------------------------------- HBO Safety Checklist Details Patient Name: Date of Service: [name] [name]. 09/20/2019 1:00 PM Medical Record [id number] Patient Account Number: [id number] Date of Birth/Sex: Treating RN: [birth year] (63 y.o. [name]) Carlene Coria Primary Care Aeriana Speece: [name] Other Clinician: Referring Shawni Volkov: Treating Shatasia Cutshaw/Extender:[name], [name] [name], [name] [name] in Treatment: 1 HBO Safety Checklist Items Safety Checklist Consent Form Signed Patient voided / foley secured and emptied When did you last eato n/a Last dose of injectable or oral agent n/a NA Ostomy pouch emptied and vented if applicable NA All implantable devices assessed, documented and approved NA Intravenous access site secured and place Valuables secured Linens and cotton and cotton/polyester blend (less than 51% polyester) Personal oil-based products / skin lotions / body lotions removed NA Wigs or hairpieces removed NA Smoking or tobacco materials removed Books / newspapers / magazines / loose paper removed Cologne, aftershave, perfume and deodorant removed Jewelry removed (may wrap wedding band) NA Make-up removed NA Hair care products removed NA Battery operated devices (external) removed NA Heating patches and chemical warmers removed NA Titanium eyewear removed NA Nail polish cured greater than 10 hours NA Casting material cured greater than 10 hours NA Hearing aids removed NA Loose dentures or partials removed NA Prosthetics have been removed Patient demonstrates correct use  of air break device (if applicable) Patient concerns have been addressed Patient grounding bracelet on and cord attached to chamber Specifics for Inpatients (complete in addition to above) Medication sheet sent with patient Intravenous medications needed or due during therapy sent with patient Drainage tubes (e.g. nasogastric tube or chest tube secured and vented) Endotracheal or Tracheotomy tube secured Cuff deflated of air  and inflated with saline Airway suctioned Electronic Signature(s) Signed: 09/20/2019 1:51:21 PM By: Mikeal Hawthorne EMT/HBOT Entered By: Mikeal Hawthorne on 09/20/2019 13:51:21

## 2019-09-20 NOTE — Progress Notes (Signed)
DANH, POYSER ([id number]) Visit Report for 09/19/2019 Arrival Information Details Patient Name: Date of Service: [name] [name], TOLHURST 09/19/2019 1:00 PM Medical Record [id number] Patient Account Number: [id number] Date of Birth/Sex: Treating RN: [birth year] (63 y.o. [name] Primary Care Deaveon Schoen: [name] Other Clinician: Referring Ezinne Yogi: Treating Sarah-Jane Nazario/Extender:[name], [name], [name] in Treatment: 0 Visit Information History Since Last Visit Added or deleted any medications: No Patient Arrived: Ambulatory Any new allergies or adverse reactions: No Arrival Time: 12:45 Had a fall or experienced change in No Accompanied By: self activities of daily living that may affect Transfer Assistance: None risk of falls: Patient Identification Verified: Yes Signs or symptoms of abuse/neglect since last No Secondary Verification Process Yes visito Completed: Hospitalized since last visit: No Patient Requires Transmission-Based No Implantable device outside of the clinic excluding No Precautions: cellular tissue based products placed in the center Patient Has Alerts: No since last visit: Pain Present Now: No Electronic Signature(s) Signed: 09/20/2019 4:00:22 PM By: Mikeal Hawthorne EMT/HBOT Entered By: Mikeal Hawthorne on 09/19/2019 13:38:39 -------------------------------------------------------------------------------- Encounter Discharge Information Details Patient Name: Date of Service: [name] Baptise A. 09/19/2019 1:00 PM Medical Record [id number] Patient Account Number: [id number] Date of Birth/Sex: Treating RN: 1956/01/31 (63 y.o. [name] Primary Care Bettyjean Stefanski: [name] Other Clinician: Referring Amro Winebarger: Treating Daivd Fredericksen/Extender:[name], [name], [name] in Treatment: 0 Encounter Discharge Information Items Discharge Condition: Stable Ambulatory Status: Ambulatory Discharge  Destination: Home Transportation: Private Auto Accompanied By: self Schedule Follow-up Appointment: Yes Clinical Summary of Care: Patient Declined Electronic Signature(s) Signed: 09/20/2019 4:00:22 PM By: Mikeal Hawthorne EMT/HBOT Entered By: Mikeal Hawthorne on 09/19/2019 15:56:33 -------------------------------------------------------------------------------- Patient/Caregiver Education Details Patient Name: [name] [name] 11/16/2020andnbsp1:00 Date of Service: PM Medical Record [id number] Number: Patient Account Number: [id number] Treating RN: [birth year] (63 y.o. Levan Hurst Date of Birth/Gender: M) Other Clinician: Primary Care Physician: [name] Treating [name] Referring Physician: Physician/Extender: [name] in Treatment: 0 Education Assessment Education Provided To: Patient Education Topics Provided Hyperbaric Oxygenation: Methods: Explain/Verbal Responses: State content correctly Electronic Signature(s) Signed: 09/20/2019 4:00:22 PM By: Mikeal Hawthorne EMT/HBOT Entered By: Mikeal Hawthorne on 09/19/2019 15:56:23 -------------------------------------------------------------------------------- Vitals Details Patient Name: Date of Service: [name] [name]. 09/19/2019 1:00 PM Medical Record [id number] Patient Account Number: [id number] Date of Birth/Sex: Treating RN: Aug 10, 1956 (63 y.o. [name] Primary Care Amela Handley: [name] Other Clinician: Referring Vineet Kinney: Treating Arthuro Canelo/Extender:[name], [name], [name] in Treatment: 0 Vital Signs Time Taken: 12:50 Temperature (F): 98 Pulse (bpm): 70 Respiratory Rate (breaths/min): 17 Blood Pressure (mmHg): 156/85 Reference Range: 80 - 120 mg / dl Electronic Signature(s) Signed: 09/20/2019 4:00:22 PM By: Mikeal Hawthorne EMT/HBOT Entered By: Mikeal Hawthorne on 09/19/2019 13:38:52

## 2019-09-20 NOTE — Progress Notes (Signed)
TRAYCE, KEES ([id number]) Visit Report for 09/20/2019 SuperBill Details Patient Name: Date of Service: [name] [name], [name] [name] 09/20/2019 Medical Record [id number] Patient Account Number: [id number] Date of Birth/Sex: Treating RN: [birth year] (63 y.o. 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

## 2019-09-20 NOTE — Progress Notes (Signed)
[name], [name] ([id number]) Visit Report for 09/20/2019 Arrival Information Details Patient Name: Date of Service: [name], [name] 09/20/2019 1:00 PM Medical Record [id number] Patient Account Number: [id number] Date of Birth/Sex: Treating RN: [birth year] (63 y.o. [name]) Carlene Coria Primary Care Markes Shatswell: [name] Other Clinician: Referring Juandedios Dudash: Treating [name] Usery/Extender:[name], [name], [name] in Treatment: 1 Visit Information History Since Last Visit Added or deleted any medications: No Patient Arrived: Ambulatory Any new allergies or adverse reactions: No Arrival Time: 12:50 Had a fall or experienced change in No Accompanied By: self activities of daily living that may affect Transfer Assistance: None risk of falls: Patient Identification Verified: Yes Signs or symptoms of abuse/neglect since last No Secondary Verification Process Yes visito Completed: Hospitalized since last visit: No Patient Requires Transmission-Based No Implantable device outside of the clinic excluding No Precautions: cellular tissue based products placed in the center Patient Has Alerts: No since last visit: Pain Present Now: No Electronic Signature(s) Signed: 09/20/2019 4:00:22 PM By: Mikeal Hawthorne EMT/HBOT Entered By: Mikeal Hawthorne on 09/20/2019 13:49:49 -------------------------------------------------------------------------------- Encounter Discharge Information Details Patient Name: Date of Service: [name] [name] 09/20/2019 1:00 PM Medical Record [id number] Patient Account Number: [id number] Date of Birth/Sex: Treating RN: 1956/03/01 (63 y.o. Oval Linsey Primary Care Jordis Repetto: [name] Other Clinician: Referring Bricelyn Freestone: Treating Mykelti Goldenstein/Extender:[name], [name], [name] in Treatment: 1 Encounter Discharge Information Items Discharge Condition: Stable Ambulatory Status: Ambulatory Discharge Destination:  Home Transportation: Private Auto Accompanied By: self Schedule Follow-up Appointment: Yes Clinical Summary of Care: Patient Declined Electronic Signature(s) Signed: 09/20/2019 4:00:22 PM By: Mikeal Hawthorne EMT/HBOT Entered By: Mikeal Hawthorne on 09/20/2019 16:00:00 -------------------------------------------------------------------------------- Patient/Caregiver Education Details Patient Name: [name] [name] 11/17/2020andnbsp1:00 Date of Service: PM Medical Record [id number] Number: Patient Account Number: [id number] Treating RN: [birth year] (63 y.o. Carlene Coria Date of Birth/Gender: M) Other Clinician: Primary Care Physician: [name] Treating [name] Referring Physician: Physician/Extender: [name] in Treatment: 1 Education Assessment Education Provided To: Patient Education Topics Provided Hyperbaric Oxygenation: Methods: Explain/Verbal Responses: State content correctly Electronic Signature(s) Signed: 09/20/2019 4:00:22 PM By: Mikeal Hawthorne EMT/HBOT Entered By: Mikeal Hawthorne on 09/20/2019 15:59:50 -------------------------------------------------------------------------------- Vitals Details Patient Name: Date of Service: [name] [name] 09/20/2019 1:00 PM Medical Record [id number] Patient Account Number: [id number] Date of Birth/Sex: Treating RN: December 23, 1955 (63 y.o. Oval Linsey Primary Care Rozanne Heumann: [name] Other Clinician: Referring Jerran Tappan: Treating Arial Galligan/Extender:[name], [name], [name] in Treatment: 1 Vital Signs Time Taken: 12:55 Temperature (F): 97.8 Pulse (bpm): 70 Respiratory Rate (breaths/min): 16 Blood Pressure (mmHg): 160/84 Reference Range: 80 - 120 mg / dl Electronic Signature(s) Signed: 09/20/2019 4:00:22 PM By: Mikeal Hawthorne EMT/HBOT Entered By: Mikeal Hawthorne on 09/20/2019 13:50:02

## 2019-09-21 ENCOUNTER — Other Ambulatory Visit: Payer: Self-pay

## 2019-09-21 ENCOUNTER — Encounter (HOSPITAL_BASED_OUTPATIENT_CLINIC_OR_DEPARTMENT_OTHER): Payer: Federal, State, Local not specified - PPO | Admitting: Physician Assistant

## 2019-09-21 DIAGNOSIS — N3041 Irradiation cystitis with hematuria: Secondary | ICD-10-CM | POA: Diagnosis not present

## 2019-09-21 NOTE — Progress Notes (Addendum)
[name] [name] ([id number]) Visit Report for 09/21/2019 HBO Details Patient Name: Date of Service: [name] [name] 09/21/2019 1:00 PM Medical Record [id number] Patient Account Number: [id number] Date of Birth/Sex: Treating RN: 28-Sep-1956 (63 y.o. [name] [name] Primary Care Librado Guandique: [name] [name] Other Clinician: Referring Anastasio Wogan: Treating Tinzley Dalia/Extender:Stone III, Joannie [place], [name] in Treatment: 1 HBO Treatment Course Details Treatment Course Number: 1 Ordering Keidrick Murty: [name] Total Treatments Ordered: 40 HBO Treatment Start Date: 09/19/2019 HBO Indication: Late Effect of Radiation HBO Treatment Details Treatment Number: 3 Patient Type: Outpatient Chamber Type: Monoplace Chamber Serial #: [id number] Treatment Protocol: 2.5 ATA with 90 minutes oxygen, with two 5 minute air breaks Treatment Details Compression Rate Down: 2.0 psi / minute De-Compression Rate Up: 2.0 psi / minute Air breaks and CompressTx Pressure breathing periods DecompressDecompress Begins Reached (leave unused spaces Begins Ends blank) Chamber Pressure (ATA)1 2.5 2.5 2.5 2.5 2.5 --2.5 1 Clock Time (24 hr) 13:23 13:35 [id number] 15:27 Treatment Length: 124 (minutes) Treatment Segments: 4 Vital Signs Capillary Blood Glucose Reference Range: 80 - 120 mg / dl HBO Diabetic Blood Glucose Intervention Range: <131 mg/dl or >249 mg/dl Time Vitals Blood Respiratory Capillary Blood Glucose Pulse Action Type: Pulse: Temperature: Taken: Pressure: Rate: Glucose (mg/dl): Meter #: Oximetry (%) Taken: Pre 13:10 143/86 72 13 98 Post 15:30 158/93 85 16 97.9 Treatment Response Treatment Toleration: Well Treatment Completion Treatment Completed without Adverse Event Status: Physician HBO Attestation: I certify that I supervised this HBO treatment in accordance with Medicare guidelines. A trained Yes emergency response team is readily available  per hospital policies and procedures. Continue HBOT as ordered. Yes Electronic Signature(s) Signed: 09/21/2019 5:11:42 PM By: [name] PA-C Previous Signature: 09/21/2019 4:21:10 PM Version By: Mikeal Hawthorne EMT/HBOT Entered By: [name] on 09/21/2019 17:00:20 -------------------------------------------------------------------------------- HBO Safety Checklist Details Patient Name: Date of Service: [name] [name] 09/21/2019 1:00 PM Medical Record [id number] Patient Account Number: [id number] Date of Birth/Sex: Treating RN: 1956/10/24 (63 y.o. [name] [name] Primary Care Colman Birdwell: [name] [name] Other Clinician: Referring Aubriegh Minch: Treating Otto Felkins/Extender:Stone III, Joannie [place], [name] in Treatment: 1 HBO Safety Checklist Items Safety Checklist Consent Form Signed Patient voided / foley secured and emptied When did you last eato n/a Last dose of injectable or oral agent n/a NA Ostomy pouch emptied and vented if applicable NA All implantable devices assessed, documented and approved NA Intravenous access site secured and place Valuables secured Linens and cotton and cotton/polyester blend (less than 51% polyester) Personal oil-based products / skin lotions / body lotions removed NA Wigs or hairpieces removed NA Smoking or tobacco materials removed Books / newspapers / magazines / loose paper removed Cologne, aftershave, perfume and deodorant removed Jewelry removed (may wrap wedding band) NA Make-up removed Hair care products removed NA Battery operated devices (external) removed NA Heating patches and chemical warmers removed NA Titanium eyewear removed NA Nail polish cured greater than 10 hours NA Casting material cured greater than 10 hours NA Hearing aids removed NA Loose dentures or partials removed NA Prosthetics have been removed Patient demonstrates correct use of air break device (if applicable) Patient concerns have  been addressed Patient grounding bracelet on and cord attached to chamber Specifics for Inpatients (complete in addition to above) Medication sheet sent with patient Intravenous medications needed or due during therapy sent with patient Drainage tubes (e.g. nasogastric tube or chest tube secured and vented) Endotracheal or Tracheotomy tube secured Cuff deflated of air and inflated with saline Airway  suctioned Electronic Signature(s) Signed: 09/21/2019 1:41:54 PM By: Mikeal Hawthorne EMT/HBOT Entered By: Mikeal Hawthorne on 09/21/2019 13:41:54

## 2019-09-21 NOTE — Progress Notes (Signed)
[name] [name] ([id number]) Visit Report for 09/21/2019 Arrival Information Details Patient Name: Date of Service: [name] [name], [name] [name] 09/21/2019 1:00 PM Medical Record [id number] Patient Account Number: [id number] Date of Birth/Sex: Treating RN: [name] (63 y.o. [name] [name] Primary Care [name] [name]: [name] [name] Other Clinician: Referring [name] [name]: Treating [name] [name]/Extender:[name] [name], [name] [name] in Treatment: 1 Visit Information History Since Last Visit Added or deleted any medications: No Patient Arrived: Ambulatory Any new allergies or adverse reactions: No Arrival Time: 13:05 Had a fall or experienced change in No Accompanied By: self activities of daily living that may affect Transfer Assistance: None risk of falls: Patient Identification Verified: Yes Signs or symptoms of abuse/neglect since last No Secondary Verification Process Yes visito Completed: Hospitalized since last visit: No Patient Requires Transmission-Based No Implantable device outside of the clinic excluding No Precautions: cellular tissue based products placed in the center Patient Has Alerts: No since last visit: Pain Present Now: No Electronic Signature(s) Signed: 09/21/2019 4:21:10 PM By: [name] [name] EMT/HBOT Entered By: [name] [name] on 09/21/2019 13:41:03 -------------------------------------------------------------------------------- Encounter Discharge Information Details Patient Name: Date of Service: [name] [name] 09/21/2019 1:00 PM Medical Record [id number] Patient Account Number: [id number] Date of Birth/Sex: Treating RN: [name] [name] (63 y.o. [name] [name] Primary Care [name] [name]: [name] [name] Other Clinician: Referring Anyia Gierke: Treating [name] [name]/Extender:[name] [name], [name] [name] in Treatment: 1 Encounter Discharge Information Items Discharge Condition: Stable Ambulatory Status: Ambulatory Discharge  Destination: Home Transportation: Private Auto Accompanied By: self Schedule Follow-up Appointment: Yes Clinical Summary of Care: Patient Declined Electronic Signature(s) Signed: 09/21/2019 4:21:10 PM By: [name] [name] EMT/HBOT Entered By: [name] [name] on 09/21/2019 15:56:49 -------------------------------------------------------------------------------- Patient/Caregiver Education Details Patient Name: [name] [name] 11/18/2020andnbsp1:00 Date of Service: PM Medical Record [id number] Number: Patient Account Number: [id number] Treating RN: [name] (63 y.o. [name] [name] Date of Birth/Gender: M) Other Clinician: Primary Care Physician: [name] [name] Treating [name] [name] Referring Physician: Physician/Extender: [name] [name] in Treatment: 1 Education Assessment Education Provided To: Patient Education Topics Provided Hyperbaric Oxygenation: Methods: Explain/Verbal Responses: State content correctly Electronic Signature(s) Signed: 09/21/2019 4:21:10 PM By: [name] [name] EMT/HBOT Entered By: [name] [name] on 09/21/2019 15:56:21 -------------------------------------------------------------------------------- Vitals Details Patient Name: Date of Service: [name] [name]. 09/21/2019 1:00 PM Medical Record [id number] Patient Account Number: [id number] Date of Birth/Sex: Treating RN: [name] (63 y.o. [name] [name] Primary Care [name] [name]: [name] [name] Other Clinician: Referring [name] [name]: Treating [name] [name]/Extender:[name] [name], [name] [name] in Treatment: 1 Vital Signs Time Taken: 13:10 Temperature (F): 98 Pulse (bpm): 72 Respiratory Rate (breaths/min): 13 Blood Pressure (mmHg): 143/86 Reference Range: 80 - 120 mg / dl Electronic Signature(s) Signed: 09/21/2019 4:21:10 PM By: [name] [name] EMT/HBOT Entered By: [name] [name] on 09/21/2019 13:41:18

## 2019-09-21 NOTE — Progress Notes (Signed)
BASIT, TEAKELL ([id number]) Visit Report for 09/21/2019 Problem List Details Patient Name: Date of Service: [name] [name], [name] [name] 09/21/2019 1:00 PM Medical Record [id number] Patient Account Number: [id number] Date of Birth/Sex: Treating RN: [birth year] (63 y.o. 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

## 2019-09-22 ENCOUNTER — Encounter (HOSPITAL_BASED_OUTPATIENT_CLINIC_OR_DEPARTMENT_OTHER): Payer: Federal, State, Local not specified - PPO | Admitting: Internal Medicine

## 2019-09-22 DIAGNOSIS — N3041 Irradiation cystitis with hematuria: Secondary | ICD-10-CM | POA: Diagnosis not present

## 2019-09-22 NOTE — Progress Notes (Addendum)
[name] [name] ([id number]) Visit Report for 09/22/2019 HBO Details Patient Name: Date of Service: [name], [name] 09/22/2019 1:00 PM Medical Record [id number] Patient Account Number: [id number] Date of Birth/Sex: Treating RN: 07/20/1956 (63 y.o. [name] [name], Meta.Reding Primary Care Aubree Doody: [name] Other Clinician: Referring Monnica Saltsman: Treating Faruq Rosenberger/Extender:[name], [name], [name] in Treatment: 1 HBO Treatment Course Details Treatment Course Number: 1 Ordering Yacine Garriga: [name] Total Treatments Ordered: 40 HBO Treatment Start Date: 09/19/2019 HBO Indication: Late Effect of Radiation HBO Treatment Details Treatment Number: 4 Patient Type: Outpatient Chamber Type: Monoplace Chamber Serial #: [id number] Treatment Protocol: 2.5 ATA with 90 minutes oxygen, with two 5 minute air breaks Treatment Details Compression Rate Down: 2.0 psi / minute De-Compression Rate Up: 2.0 psi / minute Air breaks and CompressTx Pressure breathing periods DecompressDecompress Begins Reached (leave unused spaces Begins Ends blank) Chamber Pressure (ATA)1 2.5 2.5 2.5 2.5 2.5 --2.5 1 Clock Time (24 hr) 13:11 13:23 [id number] 15:15 Treatment Length: 124 (minutes) Treatment Segments: 4 Vital Signs Capillary Blood Glucose Reference Range: 80 - 120 mg / dl HBO Diabetic Blood Glucose Intervention Range: <131 mg/dl or >249 mg/dl Time Vitals Blood Respiratory Capillary Blood Glucose Pulse Action Type: Pulse: Temperature: Taken: Pressure: Rate: Glucose (mg/dl): Meter #: Oximetry (%) Taken: Pre 13:00 159/86 72 17 97.9 Post 15:17 161/88 92 16 98.2 Treatment Response Treatment Toleration: Well Treatment Completion Treatment Completed without Adverse Event Status: Tylin Force Notes No concerns with treatment given Physician HBO Attestation: I certify that I supervised this HBO treatment in accordance with Medicare guidelines. A  trained Yes Yes emergency response team is readily available per hospital policies and procedures. Continue HBOT as ordered. Yes Electronic Signature(s) Signed: 09/22/2019 6:25:25 PM By: [name] MD Entered By: [name] on 09/22/2019 18:24:29 -------------------------------------------------------------------------------- HBO Safety Checklist Details Patient Name: Date of Service: [name] [name] 09/22/2019 1:00 PM Medical Record [id number] Patient Account Number: [id number] Date of Birth/Sex: Treating RN: 1956-10-23 (63 y.o. [name] [name], Meta.Reding Primary Care Janasia Coverdale: [name] Other Clinician: Referring Gaege Sangalang: Treating Nyjah Schwake/Extender:[name], [name], [name] in Treatment: 1 HBO Safety Checklist Items Safety Checklist Consent Form Signed Patient voided / foley secured and emptied When did you last eato n/a Last dose of injectable or oral agent n/a NA Ostomy pouch emptied and vented if applicable NA All implantable devices assessed, documented and approved NA Intravenous access site secured and place Valuables secured Linens and cotton and cotton/polyester blend (less than 51% polyester) Personal oil-based products / skin lotions / body lotions removed NA Wigs or hairpieces removed NA Smoking or tobacco materials removed Books / newspapers / magazines / loose paper removed Cologne, aftershave, perfume and deodorant removed Jewelry removed (may wrap wedding band) NA Make-up removed Hair care products removed NA Battery operated devices (external) removed NA Heating patches and chemical warmers removed NA Titanium eyewear removed NA Nail polish cured greater than 10 hours NA Casting material cured greater than 10 hours NA Hearing aids removed NA Loose dentures or partials removed NA Prosthetics have been removed Patient demonstrates correct use of air break device (if applicable) Patient concerns have been  addressed Patient grounding bracelet on and cord attached to chamber Specifics for Inpatients (complete in addition to above) Medication sheet sent with patient Intravenous medications needed or due during therapy sent with patient Drainage tubes (e.g. nasogastric tube or chest tube secured and vented) Endotracheal or Tracheotomy tube secured Cuff deflated of air and inflated with saline Airway suctioned Electronic Signature(s) Signed: 09/22/2019 1:39:26  PM By: Mikeal Hawthorne EMT/HBOT Entered By: Mikeal Hawthorne on 09/22/2019 13:39:25

## 2019-09-23 ENCOUNTER — Other Ambulatory Visit: Payer: Self-pay

## 2019-09-23 ENCOUNTER — Encounter (HOSPITAL_BASED_OUTPATIENT_CLINIC_OR_DEPARTMENT_OTHER): Payer: Federal, State, Local not specified - PPO | Admitting: Internal Medicine

## 2019-09-23 DIAGNOSIS — N3041 Irradiation cystitis with hematuria: Secondary | ICD-10-CM | POA: Diagnosis not present

## 2019-09-23 NOTE — Progress Notes (Signed)
[name] [name], [name] [name] ([id number]) Visit Report for 09/23/2019 SuperBill Details Patient Name: Date of Service: [name] [name], [name] [name] 09/23/2019 Medical Record [id number] Patient Account Number: [id number] Date of Birth/Sex: Treating RN: 01-Jan-1956 (63 y.o. 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

## 2019-09-23 NOTE — Progress Notes (Signed)
KA, POLISHCHUK ([id number]) Visit Report for 09/22/2019 SuperBill Details Patient Name: Date of Service: [name] [name], [name] [name] 09/22/2019 Medical Record [id number] Patient Account Number: [id number] Date of Birth/Sex: Treating RN: 1956-01-16 (63 y.o. 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

## 2019-09-23 NOTE — Progress Notes (Signed)
SANTIEL, HOCKEY ([id number]) Visit Report for 09/23/2019 Arrival Information Details Patient Name: Date of Service: [name] [name], [name] [name] 09/23/2019 1:00 PM Medical Record [id number] Patient Account Number: [id number] Date of Birth/Sex: Treating RN: 08/26/1956 (63 y.o. M) Primary Care Forest Pruden: [name] Other Clinician: Referring Jamaury Gumz: Treating Kamala Kolton/Extender:[name], [name], [name] in Treatment: 1 Visit Information History Since Last Visit Added or deleted any medications: No Patient Arrived: Ambulatory Any new allergies or adverse reactions: No Arrival Time: 13:00 Had a fall or experienced change in No Accompanied By: self activities of daily living that may affect Transfer Assistance: None risk of falls: Patient Identification Verified: Yes Signs or symptoms of abuse/neglect since last No Secondary Verification Process Yes visito Completed: Hospitalized since last visit: No Patient Requires Transmission-Based No Implantable device outside of the clinic excluding No Precautions: cellular tissue based products placed in the center Patient Has Alerts: No since last visit: Pain Present Now: No Electronic Signature(s) Signed: 09/23/2019 4:45:58 PM By: Mikeal Hawthorne EMT/HBOT Entered By: Mikeal Hawthorne on 09/23/2019 13:47:13 -------------------------------------------------------------------------------- Encounter Discharge Information Details Patient Name: Date of Service: [name] [name] 09/23/2019 1:00 PM Medical Record [id number] Patient Account Number: [id number] Date of Birth/Sex: Treating RN: 01-Sep-1956 (63 y.o. M) Primary Care Leobardo Granlund: [name] Other Clinician: Referring Rhea Thrun: Treating Genny Caulder/Extender:[name], [name], [name] in Treatment: 1 Encounter Discharge Information Items Discharge Condition: Stable Ambulatory Status: Ambulatory Discharge Destination: Home Transportation:  Private Auto Accompanied By: self Schedule Follow-up Appointment: Yes Clinical Summary of Care: Patient Declined Electronic Signature(s) Signed: 09/23/2019 4:45:58 PM By: Mikeal Hawthorne EMT/HBOT Entered By: Mikeal Hawthorne on 09/23/2019 16:11:46 -------------------------------------------------------------------------------- Patient/Caregiver Education Details Patient Name: [name] [name] 11/20/2020andnbsp1:00 Date of Service: PM Medical Record [id number] Number: Patient Account Number: [id number] Treating RN: 1956-05-24 (63 y.o. Date of Birth/Gender: M) Other Clinician: Primary Care Physician: [name] Treating [name] Referring Physician: Physician/Extender: [name] in Treatment: 1 Education Assessment Education Provided To: Patient Education Topics Provided Hyperbaric Oxygenation: Methods: Explain/Verbal Responses: State content correctly Electronic Signature(s) Signed: 09/23/2019 4:45:58 PM By: Mikeal Hawthorne EMT/HBOT Entered By: Mikeal Hawthorne on 09/23/2019 16:11:37 -------------------------------------------------------------------------------- Vitals Details Patient Name: Date of Service: [name] [name]. 09/23/2019 1:00 PM Medical Record [id number] Patient Account Number: [id number] Date of Birth/Sex: Treating RN: 1956/05/23 (63 y.o. M) Primary Care Ramon Zanders: [name] Other Clinician: Referring Ashby Leflore: Treating Brystol Wasilewski/Extender:[name], [name], [name] in Treatment: 1 Vital Signs Time Taken: 13:05 Temperature (F): 98.5 Pulse (bpm): 77 Respiratory Rate (breaths/min): 15 Blood Pressure (mmHg): 186/91 Reference Range: 80 - 120 mg / dl Electronic Signature(s) Signed: 09/23/2019 4:45:58 PM By: Mikeal Hawthorne EMT/HBOT Entered By: Mikeal Hawthorne on 09/23/2019 13:47:49

## 2019-09-23 NOTE — Progress Notes (Signed)
RAYLAN, HEADINGS ([id number]) Visit Report for 09/22/2019 Arrival Information Details Patient Name: Date of Service: [name], [name] 09/22/2019 1:00 PM Medical Record [id number] Patient Account Number: [id number] Date of Birth/Sex: Treating RN: [birth year] (63 y.o. [name] [name], Meta.Reding Primary Care Brayten Komar: [name] Other Clinician: Referring [name]: Treating [name]/Extender:[name], [name], [name] in Treatment: 1 Visit Information History Since Last Visit Added or deleted any medications: No Patient Arrived: Ambulatory Any new allergies or adverse reactions: No Arrival Time: 12:55 Had a fall or experienced change in No Accompanied By: self activities of daily living that may affect Transfer Assistance: None risk of falls: Patient Identification Verified: Yes Signs or symptoms of abuse/neglect since last No Secondary Verification Process Yes visito Completed: Hospitalized since last visit: No Patient Requires Transmission-Based No Implantable device outside of the clinic excluding No Precautions: cellular tissue based products placed in the center Patient Has Alerts: No since last visit: Pain Present Now: No Electronic Signature(s) Signed: 09/23/2019 4:45:58 PM By: Mikeal Hawthorne EMT/HBOT Entered By: Mikeal Hawthorne on 09/22/2019 13:38:39 -------------------------------------------------------------------------------- Encounter Discharge Information Details Patient Name: Date of Service: [name] Baptise A. 09/22/2019 1:00 PM Medical Record [id number] Patient Account Number: [id number] Date of Birth/Sex: Treating RN: June 05, 1956 (63 y.o. [name] [name] Primary Care [name]: [name] Other Clinician: Referring [name]: Treating [name]/Extender:[name], [name], [name] in Treatment: 1 Encounter Discharge Information Items Discharge Condition: Stable Ambulatory Status: Ambulatory Discharge Destination:  Home Transportation: Private Auto Accompanied By: self Schedule Follow-up Appointment: Yes Clinical Summary of Care: Patient Declined Electronic Signature(s) Signed: 09/23/2019 4:45:58 PM By: Mikeal Hawthorne EMT/HBOT Entered By: Mikeal Hawthorne on 09/22/2019 15:49:29 -------------------------------------------------------------------------------- Patient/Caregiver Education Details Patient Name: [name] [name] 11/19/2020andnbsp1:00 Date of Service: PM Medical Record [id number] Number: Patient Account Number: [id number] Treating RN: 1956/08/12 (63 y.o. [name] Date of Birth/Gender: M) Other Clinician: Primary Care Physician: [name] Treating [name] Referring Physician: Physician/Extender: [name] in Treatment: 1 Education Assessment Education Provided To: Patient Education Topics Provided Hyperbaric Oxygenation: Methods: Explain/Verbal Responses: State content correctly Electronic Signature(s) Signed: 09/23/2019 4:45:58 PM By: Mikeal Hawthorne EMT/HBOT Entered By: Mikeal Hawthorne on 09/22/2019 15:49:16 -------------------------------------------------------------------------------- Vitals Details Patient Name: Date of Service: [name] [name]. 09/22/2019 1:00 PM Medical Record [id number] Patient Account Number: [id number] Date of Birth/Sex: Treating RN: [birth year] (63 y.o. [name] [name] Primary Care [name]: [name] Other Clinician: Referring Shephanie Romas: Treating [name]/Extender:[name], [name], [name] in Treatment: 1 Vital Signs Time Taken: 13:00 Temperature (F): 97.9 Pulse (bpm): 72 Respiratory Rate (breaths/min): 17 Blood Pressure (mmHg): 159/86 Reference Range: 80 - 120 mg / dl Electronic Signature(s) Signed: 09/23/2019 4:45:58 PM By: Mikeal Hawthorne EMT/HBOT Entered By: Mikeal Hawthorne on 09/22/2019 13:38:53

## 2019-09-23 NOTE — Progress Notes (Addendum)
[name] [name] ([id number]) Visit Report for 09/23/2019 HBO Details Patient Name: Date of Service: [name] [name], [name] [name] 09/23/2019 1:00 PM Medical Record [id number] Patient Account Number: [id number] Date of Birth/Sex: Treating RN: 03/26/1956 (63 y.o. M) Primary Care Tilak Oakley: [name] Other Clinician: Referring Hesston Hitchens: Treating Lucillie Kiesel/Extender:[name], [name], [name] in Treatment: 1 HBO Treatment Course Details Treatment Course Number: 1 Ordering Charmel Pronovost: [name] Total Treatments Ordered: 40 HBO Treatment Start Date: 09/19/2019 HBO Indication: Late Effect of Radiation HBO Treatment Details Treatment Number: 5 Patient Type: Outpatient Chamber Type: Monoplace Chamber Serial #: [id number] Treatment Protocol: 2.5 ATA with 90 minutes oxygen, with two 5 minute air breaks Treatment Details Compression Rate Down: 2.0 psi / minute De-Compression Rate Up: 2.0 psi / minute Air breaks and CompressTx Pressure breathing periods DecompressDecompress Begins Reached (leave unused spaces Begins Ends blank) Chamber Pressure (ATA)1 2.5 2.5 2.5 2.5 2.5 --2.5 1 Clock Time (24 hr) 13:03 13:15 13:4513:5014:2014:25--14:55 15:07 Treatment Length: 124 (minutes) Treatment Segments: 4 Vital Signs Capillary Blood Glucose Reference Range: 80 - 120 mg / dl HBO Diabetic Blood Glucose Intervention Range: <131 mg/dl or >249 mg/dl Time Vitals Blood Respiratory Capillary Blood Glucose Pulse Action Type: Pulse: Temperature: Taken: Pressure: Rate: Glucose (mg/dl): Meter #: Oximetry (%) Taken: Pre 13:05 186/91 77 15 98.5 Post 15:10 161/81 78 13 98 Treatment Response Treatment Toleration: Well Treatment Completion Treatment Completed without Adverse Event Status: [name] [name] Notes No concerns with treatment given Physician HBO Attestation: I certify that I supervised this HBO treatment in accordance with Medicare guidelines. A trained Yes Yes emergency  response team is readily available per hospital policies and procedures. Continue HBOT as ordered. Yes Electronic Signature(s) Signed: 09/23/2019 6:05:38 PM By: [name] MD Previous Signature: 09/23/2019 4:45:58 PM Version By: Mikeal Hawthorne EMT/HBOT Entered By: [name] on 09/23/2019 18:04:08 -------------------------------------------------------------------------------- HBO Safety Checklist Details Patient Name: Date of Service: [name] [name] 09/23/2019 1:00 PM Medical Record [id number] Patient Account Number: [id number] Date of Birth/Sex: Treating RN: [birth year] (63 y.o. M) Primary Care Tyasia Packard: [name] Other Clinician: Referring Jarnell Cordaro: Treating Ahsan Esterline/Extender:[name], [name], [name] in Treatment: 1 HBO Safety Checklist Items Safety Checklist Consent Form Signed Patient voided / foley secured and emptied When did you last eato n/a Last dose of injectable or oral agent n/a NA Ostomy pouch emptied and vented if applicable NA All implantable devices assessed, documented and approved NA Intravenous access site secured and place Valuables secured Linens and cotton and cotton/polyester blend (less than 51% polyester) Personal oil-based products / skin lotions / body lotions removed NA Wigs or hairpieces removed NA Smoking or tobacco materials removed Books / newspapers / magazines / loose paper removed Cologne, aftershave, perfume and deodorant removed Jewelry removed (may wrap wedding band) NA Make-up removed Hair care products removed NA Battery operated devices (external) removed NA Heating patches and chemical warmers removed NA Titanium eyewear removed NA Nail polish cured greater than 10 hours NA Casting material cured greater than 10 hours NA Hearing aids removed NA Loose dentures or partials removed NA Prosthetics have been removed Patient demonstrates correct use of air break device (if applicable) Patient  concerns have been addressed Patient grounding bracelet on and cord attached to chamber Specifics for Inpatients (complete in addition to above) Medication sheet sent with patient Intravenous medications needed or due during therapy sent with patient Drainage tubes (e.g. nasogastric tube or chest tube secured and vented) Endotracheal or Tracheotomy tube secured Cuff deflated of air and inflated with saline Airway  suctioned Electronic Signature(s) Signed: 09/23/2019 1:48:23 PM By: Mikeal Hawthorne EMT/HBOT Entered By: Mikeal Hawthorne on 09/23/2019 13:48:23

## 2019-09-26 ENCOUNTER — Encounter (HOSPITAL_BASED_OUTPATIENT_CLINIC_OR_DEPARTMENT_OTHER): Payer: Federal, State, Local not specified - PPO | Admitting: Internal Medicine

## 2019-09-26 ENCOUNTER — Other Ambulatory Visit: Payer: Self-pay

## 2019-09-26 ENCOUNTER — Encounter (HOSPITAL_BASED_OUTPATIENT_CLINIC_OR_DEPARTMENT_OTHER): Payer: Federal, State, Local not specified - PPO | Admitting: Physician Assistant

## 2019-09-26 DIAGNOSIS — N3041 Irradiation cystitis with hematuria: Secondary | ICD-10-CM | POA: Diagnosis not present

## 2019-09-26 NOTE — Progress Notes (Signed)
MACUS, DILLE ([id number]) Visit Report for 09/26/2019 HBO Safety Checklist Details Patient Name: Date of Service: TARVARES, RENS 09/26/2019 9:00 AM Medical Record [id number] Patient Account Number: [id number] Date of Birth/Sex: Treating RN: 1956/05/12 (63 y.o. Lorette Ang, Meta.Reding Primary Care Makesha Belitz: [name] Other Clinician: Referring Lener Ventresca: Treating Markeem Noreen/Extender:Stone III, Joannie [place], [name] in Treatment: 1 HBO Safety Checklist Items Safety Checklist Consent Form Signed Patient voided / foley secured and emptied When did you last eato n/a Last dose of injectable or oral agent n/a NA Ostomy pouch emptied and vented if applicable NA All implantable devices assessed, documented and approved NA Intravenous access site secured and place Valuables secured Linens and cotton and cotton/polyester blend (less than 51% polyester) Personal oil-based products / skin lotions / body lotions removed NA Wigs or hairpieces removed NA Smoking or tobacco materials removed Books / newspapers / magazines / loose paper removed Cologne, aftershave, perfume and deodorant removed Jewelry removed (may wrap wedding band) NA Make-up removed Hair care products removed NA Battery operated devices (external) removed NA Heating patches and chemical warmers removed NA Titanium eyewear removed NA Nail polish cured greater than 10 hours NA Casting material cured greater than 10 hours NA Hearing aids removed NA Loose dentures or partials removed NA Prosthetics have been removed Patient demonstrates correct use of air break device (if applicable) Patient concerns have been addressed Patient grounding bracelet on and cord attached to chamber Specifics for Inpatients (complete in addition to above) Medication sheet sent with patient Intravenous medications needed or due during therapy sent with patient Drainage tubes (e.g. nasogastric tube or chest tube secured  and vented) Endotracheal or Tracheotomy tube secured Cuff deflated of air and inflated with saline Airway suctioned Electronic Signature(s) Signed: 09/26/2019 10:10:21 AM By: Mikeal Hawthorne EMT/HBOT Entered By: Mikeal Hawthorne on 09/26/2019 10:10:20

## 2019-09-27 ENCOUNTER — Encounter (HOSPITAL_BASED_OUTPATIENT_CLINIC_OR_DEPARTMENT_OTHER): Payer: Federal, State, Local not specified - PPO | Admitting: Physician Assistant

## 2019-09-27 ENCOUNTER — Other Ambulatory Visit: Payer: Self-pay

## 2019-09-27 DIAGNOSIS — N3041 Irradiation cystitis with hematuria: Secondary | ICD-10-CM | POA: Diagnosis not present

## 2019-09-27 NOTE — Progress Notes (Signed)
[name] [name], [name] [name] ([id number]) Visit Report for 09/27/2019 Problem List Details Patient Name: Date of Service: [name] [name], [name] [name] 09/27/2019 9:00 AM Medical Record [id number] Patient Account Number: [id number] Date of Birth/Sex: Treating RN: 1956/03/19 (63 y.o. 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

## 2019-09-27 NOTE — Progress Notes (Signed)
[name] [name], [name] [name] ([id number]) Visit Report for 09/26/2019 Arrival Information Details Patient Name: Date of Service: EDSELL, KALO 09/26/2019 9:00 AM Medical Record [id number] Patient Account Number: [id number] Date of Birth/Sex: Treating RN: Jan 18, 1956 (63 y.o. Lorette Ang, Meta.Reding Primary Care Koen Antilla: [name] Other Clinician: Referring Saron Tweed: Treating Deshanda Molitor/Extender:Stone III, Joannie Springs, [name] in Treatment: 1 Visit Information History Since Last Visit Added or deleted any medications: No Patient Arrived: Ambulatory Any new allergies or adverse reactions: No Arrival Time: 08:55 Had a fall or experienced change in No Accompanied By: self activities of daily living that may affect Transfer Assistance: None risk of falls: Patient Identification Verified: Yes Signs or symptoms of abuse/neglect since last No Secondary Verification Process Yes visito Completed: Hospitalized since last visit: No Patient Requires Transmission-Based No Implantable device outside of the clinic excluding No Precautions: cellular tissue based products placed in the center Patient Has Alerts: No since last visit: Pain Present Now: No Electronic Signature(s) Signed: 09/27/2019 7:37:04 AM By: Mikeal Hawthorne EMT/HBOT Entered By: Mikeal Hawthorne on 09/26/2019 10:09:26 -------------------------------------------------------------------------------- Encounter Discharge Information Details Patient Name: Date of Service: [name] Baptise A. 09/26/2019 9:00 AM Medical Record [id number] Patient Account Number: [id number] Date of Birth/Sex: Treating RN: July 11, 1956 (63 y.o. [name] Primary Care Chestina Komatsu: [name] Other Clinician: Referring Jamicia Haaland: Treating Theresa Dohrman/Extender:Stone III, Joannie Springs, [name] in Treatment: 1 Encounter Discharge Information Items Discharge Condition: Stable Ambulatory Status: Ambulatory Discharge Destination:  Home Transportation: Private Auto Accompanied By: self Schedule Follow-up Appointment: Yes Clinical Summary of Care: Electronic Signature(s) Signed: 09/26/2019 2:18:02 PM By: [name] Entered By: [name] on 09/26/2019 11:33:52 -------------------------------------------------------------------------------- Patient/Caregiver Education Details Patient Name: [name] [name] 11/23/2020andnbsp9:00 Date of Service: AM Medical Record [id number] Number: Patient Account Number: [id number] Treating RN: 1956/08/10 (63 y.o. [name] Date of Birth/Gender: M) Other Clinician: Primary Care Physician: [name] Treating [name] Referring Physician: Physician/Extender: [name] in Treatment: 1 Education Assessment Education Provided To: Patient Education Topics Provided Hyperbaric Oxygenation: Handouts: Hyperbaric Oxygen Methods: Explain/Verbal Responses: Reinforcements needed Electronic Signature(s) Signed: 09/26/2019 2:18:02 PM By: [name] Entered By: [name] on 09/26/2019 11:33:37 -------------------------------------------------------------------------------- Vitals Details Patient Name: Date of Service: [name] [name]. 09/26/2019 9:00 AM Medical Record [id number] Patient Account Number: [id number] Date of Birth/Sex: Treating RN: 1955/11/11 (63 y.o. Lorette Ang, Tammi Klippel Primary Care Halla Chopp: [name] Other Clinician: Referring Nalah Macioce: Treating Twilia Yaklin/Extender:Stone III, Joannie Springs, [name] in Treatment: 1 Vital Signs Time Taken: 09:00 Temperature (F): 98.1 Pulse (bpm): 71 Respiratory Rate (breaths/min): 16 Blood Pressure (mmHg): 160/80 Reference Range: 80 - 120 mg / dl Electronic Signature(s) Signed: 09/27/2019 7:37:04 AM By: Mikeal Hawthorne EMT/HBOT Entered By: Mikeal Hawthorne on 09/26/2019 10:09:39

## 2019-09-28 ENCOUNTER — Encounter (HOSPITAL_BASED_OUTPATIENT_CLINIC_OR_DEPARTMENT_OTHER): Payer: Federal, State, Local not specified - PPO | Admitting: Physician Assistant

## 2019-09-28 ENCOUNTER — Other Ambulatory Visit: Payer: Self-pay

## 2019-09-28 DIAGNOSIS — N3041 Irradiation cystitis with hematuria: Secondary | ICD-10-CM | POA: Diagnosis not present

## 2019-09-28 NOTE — Progress Notes (Addendum)
JAKIE, BIELAK ([id number]) Visit Report for 09/28/2019 HBO Details Patient Name: Date of Service: [name] [name], [name] [name] 09/28/2019 9:00 AM Medical Record [id number] Patient Account Number: [id number] Date of Birth/Sex: Treating RN: August 06, 1956 (63 y.o. M) Primary Care Denice Cardon: [name] Other Clinician: Mikeal Hawthorne Referring Sedonia Kitner: Treating Shakayla Hickox/Extender:Stone III, Joannie Springs, [name] in Treatment: 2 HBO Treatment Course Details Treatment Course Number: 1 Ordering Xena Propst: [name] Total Treatments Ordered: 40 HBO Treatment Start Date: 09/19/2019 HBO Indication: Late Effect of Radiation HBO Treatment Details Treatment Number: 8 Patient Type: Outpatient Chamber Type: Monoplace Chamber Serial #: [id number] Treatment Protocol: 2.5 ATA with 90 minutes oxygen, with two 5 minute air breaks Treatment Details Compression Rate Down: 2.0 psi / minute De-Compression Rate Up: 2.0 psi / minute Air breaks and CompressTx Pressure breathing periods DecompressDecompress Begins Reached (leave unused spaces Begins Ends blank) Chamber Pressure (ATA)1 2.5 2.5 2.5 2.5 2.5 --2.5 1 Clock Time (24 hr) 09:02 09:14 09:4409:4910:1910:24--10:54 11:06 Treatment Length: 124 (minutes) Treatment Segments: 4 Vital Signs Capillary Blood Glucose Reference Range: 80 - 120 mg / dl HBO Diabetic Blood Glucose Intervention Range: <131 mg/dl or >249 mg/dl Time Vitals Blood Respiratory Capillary Blood Glucose Pulse Action Type: Pulse: Temperature: Taken: Pressure: Rate: Glucose (mg/dl): Meter #: Oximetry (%) Taken: Pre 08:55 159/82 86 15 97.5 Post 11:10 153/96 75 14 98 Treatment Response Treatment Toleration: Well Treatment Completion Treatment Completed without Adverse Event Status: Electronic Signature(s) Signed: 09/28/2019 2:51:44 PM By: Mikeal Hawthorne EMT/HBOT Signed: 09/28/2019 4:52:33 PM By: [name] PA-C Entered By: Mikeal Hawthorne on 09/28/2019  14:18:27 -------------------------------------------------------------------------------- HBO Safety Checklist Details Patient Name: Date of Service: [name] [name]. 09/28/2019 9:00 AM Medical Record [id number] Patient Account Number: [id number] Date of Birth/Sex: Treating RN: 12/19/1955 (63 y.o. M) Primary Care Zahari Fazzino: [name] Other Clinician: Referring Trenyce Loera: Treating Kathey Simer/Extender:Stone III, Joannie Springs, [name] in Treatment: 2 HBO Safety Checklist Items Safety Checklist Consent Form Signed Patient voided / foley secured and emptied When did you last eato n/a Last dose of injectable or oral agent n/a NA Ostomy pouch emptied and vented if applicable NA All implantable devices assessed, documented and approved NA Intravenous access site secured and place Valuables secured Linens and cotton and cotton/polyester blend (less than 51% polyester) Personal oil-based products / skin lotions / body lotions removed NA Wigs or hairpieces removed NA Smoking or tobacco materials removed Books / newspapers / magazines / loose paper removed Cologne, aftershave, perfume and deodorant removed Jewelry removed (may wrap wedding band) NA Make-up removed Hair care products removed NA Battery operated devices (external) removed NA Heating patches and chemical warmers removed NA Titanium eyewear removed NA Nail polish cured greater than 10 hours NA Casting material cured greater than 10 hours NA Hearing aids removed NA Loose dentures or partials removed NA Prosthetics have been removed Patient demonstrates correct use of air break device (if applicable) Patient concerns have been addressed Patient grounding bracelet on and cord attached to chamber Specifics for Inpatients (complete in addition to above) Medication sheet sent with patient Intravenous medications needed or due during therapy sent with patient Drainage tubes (e.g. nasogastric tube or chest  tube secured and vented) Endotracheal or Tracheotomy tube secured Cuff deflated of air and inflated with saline Airway suctioned Electronic Signature(s) Signed: 09/28/2019 9:56:31 AM By: Mikeal Hawthorne EMT/HBOT Entered By: Mikeal Hawthorne on 09/28/2019 09:56:31

## 2019-09-28 NOTE — Progress Notes (Signed)
[name] [name], [name] [name] ([id number]) Visit Report for 09/28/2019 SuperBill Details Patient Name: Date of Service: [name] [name], [name] [name] 09/28/2019 Medical Record [id number] Patient Account Number: [id number] Date of Birth/Sex: Treating RN: 1956-06-14 (63 y.o. 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

## 2019-09-28 NOTE — Progress Notes (Signed)
[name] [name] ([id number]) Visit Report for 09/28/2019 Arrival Information Details Patient Name: Date of Service: [name] [name], [name] [name] 09/28/2019 9:00 AM Medical Record [id number] Patient Account Number: [id number] Date of Birth/Sex: Treating RN: 02-May-1956 (63 y.o. M) Primary Care [name] [name]: [name] Other Clinician: Referring Saunders [place]: Treating [name] [name]/Extender:[name] [name] [place], [name] [name] in Treatment: 2 Visit Information History Since Last Visit Added or deleted any medications: No Patient Arrived: Ambulatory Any new allergies or adverse reactions: No Arrival Time: 08:50 Had a fall or experienced change in No Accompanied By: self activities of daily living that may affect Transfer Assistance: None risk of falls: Patient Identification Verified: Yes Signs or symptoms of abuse/neglect since last No Secondary Verification Process Yes visito Completed: Hospitalized since last visit: No Patient Requires Transmission-Based No Implantable device outside of the clinic excluding No Precautions: cellular tissue based products placed in the center Patient Has Alerts: No since last visit: Pain Present Now: No Electronic Signature(s) Signed: 09/28/2019 2:51:44 PM By: Mikeal Hawthorne EMT/HBOT Entered By: Mikeal Hawthorne on 09/28/2019 09:55:44 -------------------------------------------------------------------------------- Encounter Discharge Information Details Patient Name: Date of Service: [name] Baptise A. 09/28/2019 9:00 AM Medical Record [id number] Patient Account Number: [id number] Date of Birth/Sex: Treating RN: [birth year] (63 y.o. M) Primary Care Daveah Varone: [name] Other Clinician: Referring [name] [name]: Treating [name] [name]/Extender:[name] [name] [place], [name] [name] in Treatment: 2 Encounter Discharge Information Items Discharge Condition: Stable Ambulatory Status: Ambulatory Discharge Destination: Home Transportation:  Private Auto Accompanied By: self Schedule Follow-up Appointment: Yes Clinical Summary of Care: Patient Declined Electronic Signature(s) Signed: 09/28/2019 2:51:44 PM By: Mikeal Hawthorne EMT/HBOT Entered By: Mikeal Hawthorne on 09/28/2019 14:19:11 -------------------------------------------------------------------------------- Patient/Caregiver Education Details Patient Name: [name] [name] 11/25/2020andnbsp9:00 Date of Service: AM Medical Record [id number] Number: Patient Account Number: [id number] Treating RN: 1956-05-08 (63 y.o. Date of Birth/Gender: M) Other Clinician: Primary Care Physician: [name] Treating [name] [name] Referring Physician: Physician/Extender: [name] [name] in Treatment: 2 Education Assessment Education Provided To: Patient Education Topics Provided Hyperbaric Oxygenation: Methods: Explain/Verbal Responses: State content correctly Electronic Signature(s) Signed: 09/28/2019 2:51:44 PM By: Mikeal Hawthorne EMT/HBOT Entered By: Mikeal Hawthorne on 09/28/2019 14:19:03 -------------------------------------------------------------------------------- Vitals Details Patient Name: Date of Service: [name] Baptise A. 09/28/2019 9:00 AM Medical Record [id number] Patient Account Number: [id number] Date of Birth/Sex: Treating RN: [birth year] (63 y.o. M) Primary Care [name] [name]: [name] Other Clinician: Referring [name] [name]: Treating [name] [name]/Extender:[name] [name] [place], [name] [name] Weeks in Treatment: 2 Vital Signs Time Taken: 08:55 Temperature (F): 97.5 Pulse (bpm): 86 Respiratory Rate (breaths/min): 15 Blood Pressure (mmHg): 159/82 Reference Range: 80 - 120 mg / dl Electronic Signature(s) Signed: 09/28/2019 2:51:44 PM By: Mikeal Hawthorne EMT/HBOT Entered By: Mikeal Hawthorne on 09/28/2019 09:55:58

## 2019-09-30 ENCOUNTER — Encounter (HOSPITAL_BASED_OUTPATIENT_CLINIC_OR_DEPARTMENT_OTHER): Payer: Federal, State, Local not specified - PPO | Admitting: Internal Medicine

## 2019-10-03 ENCOUNTER — Encounter (HOSPITAL_BASED_OUTPATIENT_CLINIC_OR_DEPARTMENT_OTHER): Payer: Federal, State, Local not specified - PPO | Admitting: Internal Medicine

## 2019-10-03 ENCOUNTER — Other Ambulatory Visit: Payer: Self-pay

## 2019-10-03 DIAGNOSIS — N3041 Irradiation cystitis with hematuria: Secondary | ICD-10-CM | POA: Diagnosis not present

## 2019-10-03 NOTE — Progress Notes (Signed)
[name] [name], [name] [name] ([id number]) Visit Report for 10/03/2019 Arrival Information Details Patient Name: Date of Service: [name] [name], [name] [name] 10/03/2019 1:00 PM Medical Record [id number] Patient Account Number: [id number] Date of Birth/Sex: Treating RN: 10/29/1956 (63 y.o. M) Primary Care Yalexa Blust: [name] Other Clinician: Mikeal Hawthorne Referring Christene Pounds: Treating Dartanion Teo/Extender:[name], [name], [name] in Treatment: 2 Visit Information History Since Last Visit Added or deleted any medications: No Patient Arrived: Ambulatory Any new allergies or adverse reactions: No Arrival Time: 12:50 Had a fall or experienced change in No Accompanied By: self activities of daily living that may affect Transfer Assistance: None risk of falls: Patient Identification Verified: Yes Signs or symptoms of abuse/neglect since last No Secondary Verification Process Yes visito Completed: Hospitalized since last visit: No Patient Requires Transmission-Based No Implantable device outside of the clinic excluding No Precautions: cellular tissue based products placed in the center Patient Has Alerts: No since last visit: Pain Present Now: No Electronic Signature(s) Signed: 10/03/2019 4:19:23 PM By: Mikeal Hawthorne EMT/HBOT Entered By: Mikeal Hawthorne on 10/03/2019 14:17:56 -------------------------------------------------------------------------------- Encounter Discharge Information Details Patient Name: Date of Service: [name] [name] 10/03/2019 1:00 PM Medical Record [id number] Patient Account Number: [id number] Date of Birth/Sex: Treating RN: 15-May-1956 (63 y.o. M) Primary Care Vlasta Baskin: [name] Other Clinician: Mikeal Hawthorne Referring Faithlynn Deeley: Treating Girtie Wiersma/Extender:[name], [name], [name] in Treatment: 2 Encounter Discharge Information Items Discharge Condition: Stable Ambulatory Status: Ambulatory Discharge  Destination: Home Transportation: Private Auto Accompanied By: self Schedule Follow-up Appointment: Yes Clinical Summary of Care: Patient Declined Electronic Signature(s) Signed: 10/03/2019 4:19:23 PM By: Mikeal Hawthorne EMT/HBOT Entered By: Mikeal Hawthorne on 10/03/2019 15:24:56 -------------------------------------------------------------------------------- Patient/Caregiver Education Details Patient Name: [name] [name] 11/30/2020andnbsp1:00 Date of Service: PM Medical Record [id number] Number: Patient Account Number: [id number] Treating RN: [birth year] (63 y.o. Date of Birth/Gender: M) Other Clinician: Mikeal Hawthorne Primary Care Physician: [name] Treating [name] Referring Physician: Physician/Extender: [name] in Treatment: 2 Education Assessment Education Provided To: Patient Education Topics Provided Hyperbaric Oxygenation: Methods: Explain/Verbal Responses: State content correctly Electronic Signature(s) Signed: 10/03/2019 4:19:23 PM By: Mikeal Hawthorne EMT/HBOT Entered By: Mikeal Hawthorne on 10/03/2019 15:24:44 -------------------------------------------------------------------------------- Vitals Details Patient Name: Date of Service: [name] [name] 10/03/2019 1:00 PM Medical Record [id number] Patient Account Number: [id number] Date of Birth/Sex: Treating RN: 1956/10/10 (63 y.o. M) Primary Care Chriss Redel: [name] Other Clinician: Referring Melora Menon: Treating Alto Gandolfo/Extender:[name], [name], [name] in Treatment: 2 Vital Signs Time Taken: 12:55 Temperature (F): 98.3 Pulse (bpm): 97 Respiratory Rate (breaths/min): 16 Blood Pressure (mmHg): 166/99 Reference Range: 80 - 120 mg / dl Electronic Signature(s) Signed: 10/03/2019 4:19:23 PM By: Mikeal Hawthorne EMT/HBOT Entered By: Mikeal Hawthorne on 10/03/2019 14:18:15

## 2019-10-03 NOTE — Progress Notes (Signed)
DARE, TULIP ([id number]) Visit Report for 09/27/2019 HBO Details Patient Name: Date of Service: [name] [name], [name] [name] 09/27/2019 9:00 AM Medical Record [id number] Patient Account Number: [id number] Date of Birth/Sex: Treating RN: December 21, 1955 (63 y.o. [name] [name] Primary Care [name] [name]: [name] [name] Other Clinician: Referring [name] [name]: Treating [name] [name]/Extender:[name] [name], [name] [name], [name] [name] in Treatment: 2 HBO Treatment Course Details Treatment Course Number: 1 Ordering [name] [name]: [name] [name] Total Treatments Ordered: 40 HBO Treatment Start Date: 09/19/2019 HBO Indication: Late Effect of Radiation HBO Treatment Details Treatment Number: 7 Patient Type: Outpatient Chamber Type: Monoplace Chamber Serial #: [id number] Treatment Protocol: 2.5 ATA with 90 minutes oxygen, with two 5 minute air breaks Treatment Details Compression Rate Down: 2.0 psi / minute De-Compression Rate Up: 3.0 psi / minute Air breaks and CompressTx Pressure breathing periods DecompressDecompress Begins Reached (leave unused spaces Begins Ends blank) Chamber Pressure (ATA)1 2.5 2.5 2.5 2.5 2.5 --2.5 1 Clock Time (24 hr) 09:24 09:37 10:0710:1210:4210:47--11:17 11:27 Treatment Length: 123 (minutes) Treatment Segments: 4 Vital Signs Capillary Blood Glucose Reference Range: 80 - 120 mg / dl HBO Diabetic Blood Glucose Intervention Range: <131 mg/dl or >249 mg/dl Time Vitals Blood Respiratory Capillary Blood Glucose Pulse Action Type: Pulse: Temperature: Taken: Pressure: Rate: Glucose (mg/dl): Meter #: Oximetry (%) Taken: Pre 08:51 147/75 74 16 98 Post 11:28 151/74 61 16 97.7 Treatment Response Treatment Toleration: Well Treatment Completion Treatment Completed without Adverse Event Status: Physician HBO Attestation: I certify that I supervised this HBO treatment in accordance with Medicare guidelines. A trained Yes emergency response team is readily available  per hospital policies and procedures. Continue HBOT as ordered. Yes Electronic Signature(s) Signed: 09/27/2019 10:29:27 PM By: [name] PA-C Entered By: Worthy [name] on 09/27/2019 22:28:43 -------------------------------------------------------------------------------- HBO Safety Checklist Details Patient Name: Date of Service: [name] [name], [name] [name] 09/27/2019 9:00 AM Medical Record [id number] Patient Account Number: [id number] Date of Birth/Sex: Treating RN: 13-Nov-1955 (63 y.o. [name] [name] Primary Care Anwitha Mapes: [name] [name] Other Clinician: Referring Tae Vonada: Treating Nakeeta Sebastiani/Extender:[name] [name], [name] [name], [name] [name] in Treatment: 2 HBO Safety Checklist Items Safety Checklist Consent Form Signed Patient voided / foley secured and emptied When did you last eato 0730 Last dose of injectable or oral agent NA NA Ostomy pouch emptied and vented if applicable NA All implantable devices assessed, documented and approved NA Intravenous access site secured and place Valuables secured Linens and cotton and cotton/polyester blend (less than 51% polyester) Personal oil-based products / skin lotions / body lotions removed NA Wigs or hairpieces removed NA Smoking or tobacco materials removed Books / newspapers / magazines / loose paper removed Cologne, aftershave, perfume and deodorant removed Jewelry removed (may wrap wedding band) NA Make-up removed Hair care products removed Battery operated devices (external) removed NA Heating patches and chemical warmers removed NA Titanium eyewear removed NA Nail polish cured greater than 10 hours NA Casting material cured greater than 10 hours NA Hearing aids removed NA Loose dentures or partials removed NA Prosthetics have been removed Patient demonstrates correct use of air break device (if applicable) Patient concerns have been addressed Patient grounding bracelet on and cord attached to chamber Specifics  for Inpatients (complete in addition to above) NA Medication sheet sent with patient NA Intravenous medications needed or due during therapy sent with patient Drainage tubes (e.g. nasogastric tube or chest tube secured and NA Drainage tubes (e.g. nasogastric tube or chest tube secured and vented) NA Endotracheal or Tracheotomy tube secured NA Cuff deflated of air and  inflated with saline NA Airway suctioned Electronic Signature(s) Signed: 10/03/2019 6:27:46 PM By: Levan Hurst RN, BSN Entered By: Levan Hurst on 09/27/2019 10:31:13

## 2019-10-03 NOTE — Progress Notes (Addendum)
FRANISCO, FEDOROV ([id number]) Visit Report for 10/03/2019 HBO Details Patient Name: Date of Service: KASHTON, DEHLINGER 10/03/2019 1:00 PM Medical Record [id number] Patient Account Number: [id number] Date of Birth/Sex: Treating RN: 1955-12-21 (63 y.o. M) Primary Care Jowanna Loeffler: [name] Other Clinician: Mikeal Hawthorne Referring Johnathon Olden: Treating Makylee Sanborn/Extender:[name], [name], [name] in Treatment: 2 HBO Treatment Course Details Treatment Course Number: 1 Ordering Anniston Nellums: [name] Total Treatments Ordered: 40 HBO Treatment Start Date: 09/19/2019 HBO Indication: Late Effect of Radiation HBO Treatment Details Treatment Number: 9 Patient Type: Outpatient Chamber Type: Monoplace Chamber Serial #: [id number] Treatment Protocol: 2.5 ATA with 90 minutes oxygen, with two 5 minute air breaks Treatment Details Compression Rate Down: 2.0 psi / minute De-Compression Rate Up: 2.0 psi / minute Air breaks and CompressTx Pressure breathing periods DecompressDecompress Begins Reached (leave unused spaces Begins Ends blank) Chamber Pressure (ATA)1 2.5 2.5 2.5 2.5 2.5 --2.5 1 Clock Time (24 hr) 13:01 13:13 [id number] 15:05 Treatment Length: 124 (minutes) Treatment Segments: 4 Vital Signs Capillary Blood Glucose Reference Range: 80 - 120 mg / dl HBO Diabetic Blood Glucose Intervention Range: <131 mg/dl or >249 mg/dl Time Vitals Blood Respiratory Capillary Blood Glucose Pulse Action Type: Pulse: Temperature: Taken: Pressure: Rate: Glucose (mg/dl): Meter #: Oximetry (%) Taken: Pre 12:55 166/99 97 16 98.3 Post 15:07 163/99 66 15 97.9 Treatment Response Treatment Toleration: Well Treatment Completion Treatment Completed without Adverse Event Status: Lurline Caver Notes No concerns with treatment given Physician HBO Attestation: I certify that I supervised this HBO treatment in accordance with Medicare guidelines. A  trained Yes Yes emergency response team is readily available per hospital policies and procedures. Continue HBOT as ordered. Yes Electronic Signature(s) Signed: 10/03/2019 6:46:53 PM By: [name] MD Previous Signature: 10/03/2019 4:19:23 PM Version By: Mikeal Hawthorne EMT/HBOT Entered By: [name] on 10/03/2019 18:45:50 -------------------------------------------------------------------------------- HBO Safety Checklist Details Patient Name: Date of Service: WANE, CERRETA 10/03/2019 1:00 PM Medical Record [id number] Patient Account Number: [id number] Date of Birth/Sex: Treating RN: 02/16/1956 (63 y.o. M) Primary Care Dejane Scheibe: [name] Other Clinician: Mikeal Hawthorne Referring Lochlan Grygiel: Treating Marketta Valadez/Extender:[name], [name], [name] in Treatment: 2 HBO Safety Checklist Items Safety Checklist Consent Form Signed Patient voided / foley secured and emptied When did you last eato n/a Last dose of injectable or oral agent n/a NA Ostomy pouch emptied and vented if applicable NA All implantable devices assessed, documented and approved NA Intravenous access site secured and place Valuables secured Linens and cotton and cotton/polyester blend (less than 51% polyester) Personal oil-based products / skin lotions / body lotions removed NA Wigs or hairpieces removed NA Smoking or tobacco materials removed Books / newspapers / magazines / loose paper removed Cologne, aftershave, perfume and deodorant removed Jewelry removed (may wrap wedding band) NA Make-up removed Hair care products removed NA Battery operated devices (external) removed NA Heating patches and chemical warmers removed NA Titanium eyewear removed NA Nail polish cured greater than 10 hours NA Casting material cured greater than 10 hours NA Hearing aids removed NA Loose dentures or partials removed NA Prosthetics have been removed Patient demonstrates correct use  of air break device (if applicable) Patient concerns have been addressed Patient grounding bracelet on and cord attached to chamber Specifics for Inpatients (complete in addition to above) Medication sheet sent with patient Intravenous medications needed or due during therapy sent with patient Drainage tubes (e.g. nasogastric tube or chest tube secured and vented) Endotracheal or Tracheotomy tube secured Cuff deflated of air and  inflated with saline Airway suctioned Electronic Signature(s) Signed: 10/03/2019 2:18:50 PM By: Mikeal Hawthorne EMT/HBOT Entered By: Mikeal Hawthorne on 10/03/2019 14:18:49

## 2019-10-03 NOTE — Progress Notes (Signed)
ZIDANE, ROSHELL ([id number]) Visit Report for 10/03/2019 SuperBill Details Patient Name: Date of Service: GRANVILL, HASELTON 10/03/2019 Medical Record [id number] Patient Account Number: [id number] Date of Birth/Sex: Treating RN: 06-Oct-1956 (63 y.o. 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

## 2019-10-03 NOTE — Progress Notes (Signed)
RANSON, HOLTHAUS ([id number]) Visit Report for 09/27/2019 Arrival Information Details Patient Name: Date of Service: ARRIS, RAUTH 09/27/2019 9:00 AM Medical Record [id number] Patient Account Number: [id number] Date of Birth/Sex: Treating RN: 03/03/1956 (63 y.o. [name] Primary Care Hazelene Doten: [name] Other Clinician: Referring Zophia Marrone: Treating Jaliah Foody/Extender:Stone III, Joannie [place], [name] in Treatment: 2 Visit Information History Since Last Visit Added or deleted any medications: No Patient Arrived: Ambulatory Any new allergies or adverse reactions: No Arrival Time: 08:50 Had a fall or experienced change in No Accompanied By: alone activities of daily living that may affect Transfer Assistance: None risk of falls: Patient Identification Verified: Yes Signs or symptoms of abuse/neglect since last No Secondary Verification Process Yes visito Completed: Hospitalized since last visit: No Patient Requires Transmission-Based No Implantable device outside of the clinic excluding No Precautions: cellular tissue based products placed in the center Patient Has Alerts: No since last visit: Pain Present Now: No Electronic Signature(s) Signed: 10/03/2019 6:27:46 PM By: Levan Hurst RN, BSN Entered By: Levan Hurst on 09/27/2019 10:28:52 -------------------------------------------------------------------------------- Encounter Discharge Information Details Patient Name: Date of Service: [name] 09/27/2019 9:00 AM Medical Record [id number] Patient Account Number: [id number] Date of Birth/Sex: Treating RN: 11/28/1955 (63 y.o. [name] Primary Care Ashante Snelling: [name] Other Clinician: Referring Breelle Hollywood: Treating Riley Papin/Extender:Stone III, Joannie [place], [name] in Treatment: 2 Encounter Discharge Information Items Discharge Condition: Stable Ambulatory Status: Ambulatory Discharge  Destination: Home Transportation: Private Auto Accompanied By: alone Schedule Follow-up Appointment: Yes Clinical Summary of Care: Patient Declined Electronic Signature(s) Signed: 10/03/2019 6:27:46 PM By: Levan Hurst RN, BSN Entered By: Levan Hurst on 09/27/2019 12:06:29 -------------------------------------------------------------------------------- Vitals Details Patient Name: Date of Service: [name] 09/27/2019 9:00 AM Medical Record [id number] Patient Account Number: [id number] Date of Birth/Sex: Treating RN: 06/30/1956 (63 y.o. [name] Primary Care Danilo Cappiello: [name] Other Clinician: Referring Kaloni Bisaillon: Treating Johanny Segers/Extender:Stone III, Joannie [place], [name] in Treatment: 2 Vital Signs Time Taken: 08:51 Temperature (F): 98.0 Pulse (bpm): 74 Respiratory Rate (breaths/min): 16 Blood Pressure (mmHg): 147/75 Reference Range: 80 - 120 mg / dl Electronic Signature(s) Signed: 10/03/2019 6:27:46 PM By: Levan Hurst RN, BSN Entered By: Levan Hurst on 09/27/2019 10:29:25

## 2019-10-04 ENCOUNTER — Encounter (HOSPITAL_BASED_OUTPATIENT_CLINIC_OR_DEPARTMENT_OTHER): Payer: Federal, State, Local not specified - PPO | Attending: Internal Medicine | Admitting: Internal Medicine

## 2019-10-04 DIAGNOSIS — Z8546 Personal history of malignant neoplasm of prostate: Secondary | ICD-10-CM | POA: Diagnosis not present

## 2019-10-04 DIAGNOSIS — Y842 Radiological procedure and radiotherapy as the cause of abnormal reaction of the patient, or of later complication, without mention of misadventure at the time of the procedure: Secondary | ICD-10-CM | POA: Insufficient documentation

## 2019-10-04 DIAGNOSIS — N3041 Irradiation cystitis with hematuria: Secondary | ICD-10-CM | POA: Diagnosis present

## 2019-10-04 NOTE — Progress Notes (Signed)
POWER, ARNET ([id number]) Visit Report for 10/04/2019 SuperBill Details Patient Name: Date of Service: GREGORIE, BEHLER 10/04/2019 Medical Record [id number] Patient Account Number: [id number] Date of Birth/Sex: Treating RN: 22-Dec-1955 (63 y.o. 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

## 2019-10-04 NOTE — Progress Notes (Signed)
DORSETT, BIELEC ([id number]) Visit Report for 10/04/2019 Arrival Information Details Patient Name: Date of Service: [name] [name], [name] [name] 10/04/2019 1:00 PM Medical Record [id number] Patient Account Number: [id number] Date of Birth/Sex: Treating RN: 1956/01/26 (63 y.o. M) Primary Care Sola Margolis: [name] Other Clinician: Mikeal Hawthorne Referring Wanda Rideout: Treating Damoni Causby/Extender:[name], [name], [name] in Treatment: 3 Visit Information History Since Last Visit Added or deleted any medications: No Patient Arrived: Ambulatory Any new allergies or adverse reactions: No Arrival Time: 12:45 Had a fall or experienced change in No Accompanied By: self activities of daily living that may affect Transfer Assistance: None risk of falls: Patient Identification Verified: Yes Signs or symptoms of abuse/neglect since last No Secondary Verification Process Yes visito Completed: Hospitalized since last visit: No Patient Requires Transmission-Based No Implantable device outside of the clinic excluding No Precautions: cellular tissue based products placed in the center Patient Has Alerts: No since last visit: Pain Present Now: No Electronic Signature(s) Signed: 10/04/2019 4:40:25 PM By: Mikeal Hawthorne EMT/HBOT Entered By: Mikeal Hawthorne on 10/04/2019 16:32:12 -------------------------------------------------------------------------------- Encounter Discharge Information Details Patient Name: Date of Service: [name] [name] 10/04/2019 1:00 PM Medical Record [id number] Patient Account Number: [id number] Date of Birth/Sex: Treating RN: Feb 26, 1956 (63 y.o. M) Primary Care Jerome Viglione: [name] Other Clinician: Mikeal Hawthorne Referring Ciel Yanes: Treating Magaly Pollina/Extender:[name], [name], [name] in Treatment: 3 Encounter Discharge Information Items Discharge Condition: Stable Ambulatory Status: Ambulatory Discharge Destination:  Home Transportation: Private Auto Accompanied By: self Schedule Follow-up Appointment: Yes Clinical Summary of Care: Patient Declined Electronic Signature(s) Signed: 10/04/2019 4:40:25 PM By: Mikeal Hawthorne EMT/HBOT Entered By: Mikeal Hawthorne on 10/04/2019 16:33:49 -------------------------------------------------------------------------------- Patient/Caregiver Education Details Patient Name: Date of Service: [name] [name] 12/1/2020andnbsp1:00 PM Medical Record [phone] Patient Account Number: [id number] Date of Birth/Gender: Treating RN: 1956/06/28 (63 y.o. M) Primary Care Physician: [name] Other Clinician: Mikeal Hawthorne Referring Physician: Treating Physician/Extender:[name], [name], [name] in Treatment: 3 Education Assessment Education Provided To: Patient Education Topics Provided Hyperbaric Oxygenation: Methods: Explain/Verbal Responses: State content correctly Electronic Signature(s) Signed: 10/04/2019 4:40:25 PM By: Mikeal Hawthorne EMT/HBOT Entered By: Mikeal Hawthorne on 10/04/2019 16:33:38 -------------------------------------------------------------------------------- Vitals Details Patient Name: Date of Service: [name] [name] 10/04/2019 1:00 PM Medical Record [id number] Patient Account Number: [id number] Date of Birth/Sex: Treating RN: 1956-04-22 (63 y.o. M) Primary Care Harley Fitzwater: [name] Other Clinician: Mikeal Hawthorne Referring Danny Zimny: Treating Hazem Kenner/Extender:[name], [name], [name] in Treatment: 3 Vital Signs Time Taken: 12:45 Temperature (F): 98.3 Pulse (bpm): 97 Respiratory Rate (breaths/min): 18 Blood Pressure (mmHg): 160/98 Reference Range: 80 - 120 mg / dl Electronic Signature(s) Signed: 10/04/2019 4:40:25 PM By: Mikeal Hawthorne EMT/HBOT Entered By: Mikeal Hawthorne on 10/04/2019 16:32:18

## 2019-10-04 NOTE — Progress Notes (Addendum)
[name] [name] ([id number]) Visit Report for 10/04/2019 HBO Details Patient Name: Date of Service: [name] [name], [name] [name] 10/04/2019 1:00 PM Medical Record [id number] Patient Account Number: [id number] Date of Birth/Sex: Treating RN: [birth year] (63 y.o. M) Primary Care Gloris Shiroma: [name] Other Clinician: Mikeal Hawthorne Referring Takela Varden: Treating Angell Pincock/Extender:[name], [name], [name] in Treatment: 3 HBO Treatment Course Details Treatment Course Number: 1 Ordering Abdurrahman Petersheim: [name] Total Treatments Ordered: 40 HBO Treatment Start Date: 09/19/2019 HBO Indication: Late Effect of Radiation HBO Treatment Details Treatment Number: 10 Patient Type: Outpatient Chamber Type: Monoplace Chamber Serial #: [id number] Treatment Protocol: 2.5 ATA with 90 minutes oxygen, with two 5 minute air breaks Treatment Details Compression Rate Down: 2.0 psi / minute De-Compression Rate Up: 2.0 psi / minute Air breaks and CompressTx Pressure breathing periods DecompressDecompress Begins Reached (leave unused spaces Begins Ends blank) Chamber Pressure (ATA)1 2.5 2.5 2.5 2.5 2.5 --2.5 1 Clock Time (24 hr) 13:02 13:14 [id number] 15:06 Treatment Length: 124 (minutes) Treatment Segments: 4 Vital Signs Capillary Blood Glucose Reference Range: 80 - 120 mg / dl HBO Diabetic Blood Glucose Intervention Range: <131 mg/dl or >249 mg/dl Time Vitals Blood Respiratory Capillary Blood Glucose Pulse Action Type: Pulse: Temperature: Taken: Pressure: Rate: Glucose (mg/dl): Meter #: Oximetry (%) Taken: Pre 12:45 160/98 97 18 98.3 Post 15:10 175/91 76 16 98.1 Treatment Response Treatment Toleration: Well Treatment Completion Treatment Completed without Adverse Event Status: Sakshi Sermons Notes No concerns with treatment given Physician HBO Attestation: I certify that I supervised this HBO treatment in accordance with Medicare guidelines. A  trained Yes Yes emergency response team is readily available per hospital policies and procedures. Continue HBOT as ordered. Yes Electronic Signature(s) Signed: 10/04/2019 6:45:39 PM By: [name] MD Previous Signature: 10/04/2019 4:40:25 PM Version By: Mikeal Hawthorne EMT/HBOT Entered By: [name] on 10/04/2019 18:44:00 -------------------------------------------------------------------------------- HBO Safety Checklist Details Patient Name: Date of Service: [name] [name] 10/04/2019 1:00 PM Medical Record [id number] Patient Account Number: [id number] Date of Birth/Sex: Treating RN: 01/12/1956 (63 y.o. M) Primary Care Anshul Meddings: [name] Other Clinician: Mikeal Hawthorne Referring Magdelene Ruark: Treating Rosa Gambale/Extender:[name], [name], [name] in Treatment: 3 HBO Safety Checklist Items Safety Checklist Consent Form Signed Patient voided / foley secured and emptied When did you last eato n/a Last dose of injectable or oral agent n/a NA Ostomy pouch emptied and vented if applicable NA All implantable devices assessed, documented and approved NA Intravenous access site secured and place Valuables secured Linens and cotton and cotton/polyester blend (less than 51% polyester) Personal oil-based products / skin lotions / body lotions removed NA Wigs or hairpieces removed NA Smoking or tobacco materials removed Books / newspapers / magazines / loose paper removed Cologne, aftershave, perfume and deodorant removed Jewelry removed (may wrap wedding band) NA Make-up removed Hair care products removed NA Battery operated devices (external) removed NA Heating patches and chemical warmers removed NA Titanium eyewear removed NA Nail polish cured greater than 10 hours NA Casting material cured greater than 10 hours NA Hearing aids removed NA Loose dentures or partials removed NA Prosthetics have been removed Patient demonstrates correct use of  air break device (if applicable) Patient concerns have been addressed Patient grounding bracelet on and cord attached to chamber Specifics for Inpatients (complete in addition to above) Medication sheet sent with patient Intravenous medications needed or due during therapy sent with patient Drainage tubes (e.g. nasogastric tube or chest tube secured and vented) Endotracheal or Tracheotomy tube secured Cuff deflated of air and  inflated with saline Airway suctioned Electronic Signature(s) Signed: 10/04/2019 1:21:40 PM By: Mikeal Hawthorne EMT/HBOT Entered By: Mikeal Hawthorne on 10/04/2019 13:21:40

## 2019-10-05 ENCOUNTER — Encounter (HOSPITAL_BASED_OUTPATIENT_CLINIC_OR_DEPARTMENT_OTHER): Payer: Federal, State, Local not specified - PPO | Admitting: Physician Assistant

## 2019-10-05 ENCOUNTER — Other Ambulatory Visit: Payer: Self-pay

## 2019-10-05 DIAGNOSIS — N3041 Irradiation cystitis with hematuria: Secondary | ICD-10-CM | POA: Diagnosis not present

## 2019-10-05 NOTE — Progress Notes (Signed)
DENIM, HOAGE ([id number]) Visit Report for 10/05/2019 Arrival Information Details Patient Name: Date of Service: [name] [name], [name] [name] 10/05/2019 1:00 PM Medical Record [id number] Patient Account Number: [id number] Date of Birth/Sex: Treating RN: 1956-03-29 (63 y.o. M) Primary Care [name] [name]: [name] [name] Other Clinician: Mikeal Hawthorne Referring Almalik Weissberg: Treating [name] [name]/Extender:[name] [name], Joannie [place], [name] [name] in Treatment: 3 Visit Information History Since Last Visit Added or deleted any medications: No Patient Arrived: Ambulatory Any new allergies or adverse reactions: No Arrival Time: 12:50 Had a fall or experienced change in No Accompanied By: self activities of daily living that may affect Transfer Assistance: None risk of falls: Patient Identification Verified: Yes Signs or symptoms of abuse/neglect since last No Secondary Verification Process Yes visito Completed: Hospitalized since last visit: No Patient Requires Transmission-Based No Implantable device outside of the clinic excluding No Precautions: cellular tissue based products placed in the center Patient Has Alerts: No since last visit: Pain Present Now: No Electronic Signature(s) Signed: 10/05/2019 4:25:17 PM By: Mikeal Hawthorne EMT/HBOT Entered By: Mikeal Hawthorne on 10/05/2019 14:08:24 -------------------------------------------------------------------------------- Encounter Discharge Information Details Patient Name: Date of Service: [name] 10/05/2019 1:00 PM Medical Record [id number] Patient Account Number: [id number] Date of Birth/Sex: Treating RN: [birth year] (63 y.o. M) Primary Care [name] [name]: [name] [name] Other Clinician: Mikeal Hawthorne Referring Brodan Grewell: Treating [name] [name]/Extender:[name] [name], Joannie [place], [name] [name] in Treatment: 3 Encounter Discharge Information Items Discharge Condition: Stable Ambulatory Status: Ambulatory Discharge Destination:  Home Transportation: Private Auto Accompanied By: self Schedule Follow-up Appointment: Yes Clinical Summary of Care: Patient Declined Electronic Signature(s) Signed: 10/05/2019 4:25:17 PM By: Mikeal Hawthorne EMT/HBOT Entered By: Mikeal Hawthorne on 10/05/2019 16:14:54 -------------------------------------------------------------------------------- Patient/Caregiver Education Details Patient Name: Date of Service: [name] 12/2/2020andnbsp1:00 PM Medical Record [phone] Patient Account Number: [id number] Date of Birth/Gender: Treating RN: [birth year] (63 y.o. M) Primary Care Physician: [name] [name] Other Clinician: Mikeal Hawthorne Referring Physician: Treating Physician/Extender:[name] [name], Joannie [place], [name] [name] in Treatment: 3 Education Assessment Education Provided To: Patient Education Topics Provided Hyperbaric Oxygenation: Methods: Explain/Verbal Responses: State content correctly Electronic Signature(s) Signed: 10/05/2019 4:25:17 PM By: Mikeal Hawthorne EMT/HBOT Entered By: Mikeal Hawthorne on 10/05/2019 16:14:37 -------------------------------------------------------------------------------- Vitals Details Patient Name: Date of Service: [name] 10/05/2019 1:00 PM Medical Record [id number] Patient Account Number: [id number] Date of Birth/Sex: Treating RN: February 20, 1956 (63 y.o. M) Primary Care [name] [name]: [name] [name] Other Clinician: Mikeal Hawthorne Referring [name] [name]: Treating [name] [name]/Extender:[name] [name], Joannie [place], [name] [name] in Treatment: 3 Vital Signs Time Taken: 12:55 Temperature (F): 97.5 Pulse (bpm): 92 Respiratory Rate (breaths/min): 16 Blood Pressure (mmHg): 175/92 Reference Range: 80 - 120 mg / dl Notes [name] [name] notified of pts elevated BP. Electronic Signature(s) Signed: 10/05/2019 4:25:17 PM By: Mikeal Hawthorne EMT/HBOT Entered By: Mikeal Hawthorne on 10/05/2019 14:09:10

## 2019-10-05 NOTE — Progress Notes (Addendum)
[name] [name], [name] [name] ([id number]) Visit Report for 10/05/2019 HBO Details Patient Name: Date of Service: [name] [name], [name] [name] 10/05/2019 1:00 PM Medical Record [id number] Patient Account Number: [id number] Date of Birth/Sex: Treating RN: [birth year] (63 y.o. M) Primary Care Kaiyah Eber: [name] Other Clinician: Mikeal Hawthorne Referring Nigel Wessman: Treating Dynisha Due/Extender:Stone III, Joannie Springs, [name] in Treatment: 3 HBO Treatment Course Details Treatment Course Number: 1 Ordering Frannie Shedrick: [name] Total Treatments Ordered: 40 HBO Treatment Start Date: 09/19/2019 HBO Indication: Late Effect of Radiation HBO Treatment Details Treatment Number: 11 Patient Type: Outpatient Chamber Type: Monoplace Chamber Serial #: [id number] Treatment Protocol: 2.5 ATA with 90 minutes oxygen, with two 5 minute air breaks Treatment Details Compression Rate Down: 2.0 psi / minute De-Compression Rate Up: 2.0 psi / minute Air breaks and CompressTx Pressure breathing periods DecompressDecompress Begins Reached (leave unused spaces Begins Ends blank) Chamber Pressure (ATA)1 2.5 2.5 2.5 2.5 2.5 --2.5 1 Clock Time (24 hr) 13:01 13:13 [id number] 15:05 Treatment Length: 124 (minutes) Treatment Segments: 4 Vital Signs Capillary Blood Glucose Reference Range: 80 - 120 mg / dl HBO Diabetic Blood Glucose Intervention Range: <131 mg/dl or >249 mg/dl Time Vitals Blood Respiratory Capillary Blood Glucose Pulse Action Type: Pulse: Temperature: Taken: Pressure: Rate: Glucose (mg/dl): Meter #: Oximetry (%) Taken: Pre 12:55 175/92 92 16 97.5 Post 15:07 154/77 68 14 97.9 Treatment Response Treatment Toleration: Well Treatment Completion Treatment Completed without Adverse Event Status: Physician HBO Attestation: I certify that I supervised this HBO treatment in accordance with Medicare guidelines. A trained Yes emergency response team is readily available  per hospital policies and procedures. Continue HBOT as ordered. Yes Electronic Signature(s) Signed: 10/06/2019 10:35:04 AM By: [name] PA-C Previous Signature: 10/05/2019 4:25:17 PM Version By: Mikeal Hawthorne EMT/HBOT Entered By: [name] on 10/06/2019 10:35:04 -------------------------------------------------------------------------------- HBO Safety Checklist Details Patient Name: Date of Service: [name] [name] 10/05/2019 1:00 PM Medical Record [id number] Patient Account Number: [id number] Date of Birth/Sex: Treating RN: May 22, 1956 (63 y.o. M) Primary Care Tramell Piechota: [name] Other Clinician: Mikeal Hawthorne Referring Robbi Scurlock: Treating Ricki Vanhandel/Extender:Stone III, Joannie Springs, [name] in Treatment: 3 HBO Safety Checklist Items Safety Checklist Consent Form Signed Patient voided / foley secured and emptied When did you last eato n/a Last dose of injectable or oral agent n/a NA Ostomy pouch emptied and vented if applicable NA All implantable devices assessed, documented and approved NA Intravenous access site secured and place Valuables secured Linens and cotton and cotton/polyester blend (less than 51% polyester) Personal oil-based products / skin lotions / body lotions removed NA Wigs or hairpieces removed NA Smoking or tobacco materials removed Books / newspapers / magazines / loose paper removed Cologne, aftershave, perfume and deodorant removed Jewelry removed (may wrap wedding band) NA Make-up removed Hair care products removed NA Battery operated devices (external) removed NA Heating patches and chemical warmers removed NA Titanium eyewear removed NA Nail polish cured greater than 10 hours NA Casting material cured greater than 10 hours NA Hearing aids removed NA Loose dentures or partials removed NA Prosthetics have been removed Patient demonstrates correct use of air break device (if applicable) Patient concerns have been  addressed Patient grounding bracelet on and cord attached to chamber Specifics for Inpatients (complete in addition to above) Medication sheet sent with patient Intravenous medications needed or due during therapy sent with patient Drainage tubes (e.g. nasogastric tube or chest tube secured and vented) Endotracheal or Tracheotomy tube secured Cuff deflated of air and inflated with saline Airway  suctioned Electronic Signature(s) Signed: 10/05/2019 2:09:47 PM By: Mikeal Hawthorne EMT/HBOT Entered By: Mikeal Hawthorne on 10/05/2019 14:09:46

## 2019-10-06 ENCOUNTER — Encounter (HOSPITAL_BASED_OUTPATIENT_CLINIC_OR_DEPARTMENT_OTHER): Payer: Federal, State, Local not specified - PPO | Admitting: Internal Medicine

## 2019-10-06 ENCOUNTER — Other Ambulatory Visit: Payer: Self-pay

## 2019-10-06 DIAGNOSIS — N3041 Irradiation cystitis with hematuria: Secondary | ICD-10-CM | POA: Diagnosis not present

## 2019-10-06 NOTE — Progress Notes (Signed)
TARIK, VANNOTE ([id number]) Visit Report for 10/05/2019 Problem List Details Patient Name: Date of Service: ANEESH, WHITTENBERGER 10/05/2019 1:00 PM Medical Record [id number] Patient Account Number: [id number] Date of Birth/Sex: Treating RN: 1956-08-15 (63 y.o. 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

## 2019-10-06 NOTE — Progress Notes (Signed)
[name] [name], [name] [name] ([id number]) Visit Report for 10/06/2019 Arrival Information Details Patient Name: Date of Service: [name] [name], [name] [name] 10/06/2019 1:00 PM Medical Record [id number] Patient Account Number: [id number] Date of Birth/Sex: Treating RN: 1956/02/14 (63 y.o. M) Primary Care Jarelle Ates: [name] Other Clinician: Mikeal Hawthorne Referring Oryon Gary: Treating Shadara Lopez/Extender:[name], [name], [name] in Treatment: 3 Visit Information History Since Last Visit Added or deleted any medications: No Patient Arrived: Ambulatory Any new allergies or adverse reactions: No Arrival Time: 12:50 Had a fall or experienced change in No Accompanied By: self activities of daily living that may affect Transfer Assistance: None risk of falls: Patient Identification Verified: Yes Signs or symptoms of abuse/neglect since last No Secondary Verification Process Yes visito Completed: Hospitalized since last visit: No Patient Requires Transmission-Based No Implantable device outside of the clinic excluding No Precautions: cellular tissue based products placed in the center Patient Has Alerts: No since last visit: Pain Present Now: No Electronic Signature(s) Signed: 10/06/2019 3:43:57 PM By: Mikeal Hawthorne EMT/HBOT Entered By: Mikeal Hawthorne on 10/06/2019 13:21:38 -------------------------------------------------------------------------------- Encounter Discharge Information Details Patient Name: Date of Service: [name] [name] 10/06/2019 1:00 PM Medical Record [id number] Patient Account Number: [id number] Date of Birth/Sex: Treating RN: 1956-09-30 (63 y.o. M) Primary Care Maeola Mchaney: [name] Other Clinician: Mikeal Hawthorne Referring Eneida Evers: Treating Yoana Staib/Extender:[name], [name], [name] in Treatment: 3 Encounter Discharge Information Items Discharge Condition: Stable Ambulatory Status: Ambulatory Discharge Destination:  Home Transportation: Private Auto Accompanied By: self Schedule Follow-up Appointment: Yes Clinical Summary of Care: Patient Declined Electronic Signature(s) Signed: 10/06/2019 3:43:57 PM By: Mikeal Hawthorne EMT/HBOT Entered By: Mikeal Hawthorne on 10/06/2019 15:40:54 -------------------------------------------------------------------------------- Patient/Caregiver Education Details Patient Name: Date of Service: [name] [name] 12/3/2020andnbsp1:00 PM Medical Record [phone] Patient Account Number: [id number] Date of Birth/Gender: Treating RN: 07/06/1956 (63 y.o. M) Primary Care Physician: [name] Other Clinician: Mikeal Hawthorne Referring Physician: Treating Physician/Extender:[name], [name], [name] in Treatment: 3 Education Assessment Education Provided To: Patient Education Topics Provided Hyperbaric Oxygenation: Methods: Explain/Verbal Responses: State content correctly Electronic Signature(s) Signed: 10/06/2019 3:43:57 PM By: Mikeal Hawthorne EMT/HBOT Entered By: Mikeal Hawthorne on 10/06/2019 15:40:41 -------------------------------------------------------------------------------- Vitals Details Patient Name: Date of Service: [name] [name] 10/06/2019 1:00 PM Medical Record [id number] Patient Account Number: [id number] Date of Birth/Sex: Treating RN: [birth year] (63 y.o. M) Primary Care Brandey Vandalen: [name] Other Clinician: Mikeal Hawthorne Referring Zannah Melucci: Treating Danta Baumgardner/Extender:[name], [name], [name] in Treatment: 3 Vital Signs Time Taken: 12:55 Temperature (F): 98 Pulse (bpm): 83 Respiratory Rate (breaths/min): 15 Blood Pressure (mmHg): 157/85 Reference Range: 80 - 120 mg / dl Electronic Signature(s) Signed: 10/06/2019 3:43:57 PM By: Mikeal Hawthorne EMT/HBOT Entered By: Mikeal Hawthorne on 10/06/2019 13:21:54

## 2019-10-06 NOTE — Progress Notes (Addendum)
[name] [name], [name] [name] ([id number]) Visit Report for 10/06/2019 HBO Details Patient Name: Date of Service: [name] [name], [name] [name] 10/06/2019 1:00 PM Medical Record [id number] Patient Account Number: [id number] Date of Birth/Sex: Treating RN: 30-Apr-1956 (63 y.o. M) Primary Care Amulya Quintin: [name] Other Clinician: Mikeal Hawthorne Referring Tristan Bramble: Treating Dyann Goodspeed/Extender:[name], [name], [name] in Treatment: 3 HBO Treatment Course Details Treatment Course Number: 1 Ordering Keelynn Furgerson: [name] Total Treatments Ordered: 40 HBO Treatment Start Date: 09/19/2019 HBO Indication: Late Effect of Radiation HBO Treatment Details Treatment Number: 12 Patient Type: Outpatient Chamber Type: Monoplace Chamber Serial #: [id number] Treatment Protocol: 2.5 ATA with 90 minutes oxygen, with two 5 minute air breaks Treatment Details Compression Rate Down: 2.0 psi / minute De-Compression Rate Up: 2.0 psi / minute Air breaks and CompressTx Pressure breathing periods DecompressDecompress Begins Reached (leave unused spaces Begins Ends blank) Chamber Pressure (ATA)1 2.5 2.5 2.5 2.5 2.5 --2.5 1 Clock Time (24 hr) 12:57 13:09 [id number] 15:01 Treatment Length: 124 (minutes) Treatment Segments: 4 Vital Signs Capillary Blood Glucose Reference Range: 80 - 120 mg / dl HBO Diabetic Blood Glucose Intervention Range: <131 mg/dl or >249 mg/dl Time Vitals Blood Respiratory Capillary Blood Glucose Pulse Action Type: Pulse: Temperature: Taken: Pressure: Rate: Glucose (mg/dl): Meter #: Oximetry (%) Taken: Pre 12:55 157/85 83 15 98 Post 15:03 153/84 74 16 97.8 Treatment Response Treatment Toleration: Well Treatment Completion Treatment Completed without Adverse Event Status: Britten Parady Notes No concerns with treatment given Physician HBO Attestation: I certify that I supervised this HBO treatment in accordance with Medicare guidelines. A  trained Yes Yes emergency response team is readily available per hospital policies and procedures. Continue HBOT as ordered. Yes Electronic Signature(s) Signed: 10/07/2019 7:57:15 AM By: [name] MD Previous Signature: 10/06/2019 3:43:57 PM Version By: Mikeal Hawthorne EMT/HBOT Entered By: [name] on 10/06/2019 18:13:29 -------------------------------------------------------------------------------- HBO Safety Checklist Details Patient Name: Date of Service: [name] [name] 10/06/2019 1:00 PM Medical Record [id number] Patient Account Number: [id number] Date of Birth/Sex: Treating RN: 1956/10/08 (63 y.o. M) Primary Care Blaise Grieshaber: [name] Other Clinician: Mikeal Hawthorne Referring Jahmeek Shirk: Treating Montay Vanvoorhis/Extender:[name], [name], [name] in Treatment: 3 HBO Safety Checklist Items Safety Checklist Consent Form Signed Patient voided / foley secured and emptied When did you last eato n/a Last dose of injectable or oral agent n/a NA Ostomy pouch emptied and vented if applicable NA All implantable devices assessed, documented and approved NA Intravenous access site secured and place Valuables secured Linens and cotton and cotton/polyester blend (less than 51% polyester) Personal oil-based products / skin lotions / body lotions removed NA Wigs or hairpieces removed NA Smoking or tobacco materials removed Books / newspapers / magazines / loose paper removed Cologne, aftershave, perfume and deodorant removed Jewelry removed (may wrap wedding band) NA Make-up removed Hair care products removed NA Battery operated devices (external) removed NA Heating patches and chemical warmers removed NA Titanium eyewear removed NA Nail polish cured greater than 10 hours NA Casting material cured greater than 10 hours NA Hearing aids removed NA Loose dentures or partials removed NA Prosthetics have been removed Patient demonstrates correct use of  air break device (if applicable) Patient concerns have been addressed Patient grounding bracelet on and cord attached to chamber Specifics for Inpatients (complete in addition to above) Medication sheet sent with patient Intravenous medications needed or due during therapy sent with patient Drainage tubes (e.g. nasogastric tube or chest tube secured and vented) Endotracheal or Tracheotomy tube secured Cuff deflated of air and  inflated with saline Airway suctioned Electronic Signature(s) Signed: 10/06/2019 1:22:33 PM By: Mikeal Hawthorne EMT/HBOT Entered By: Mikeal Hawthorne on 10/06/2019 13:22:33

## 2019-10-07 ENCOUNTER — Encounter (HOSPITAL_BASED_OUTPATIENT_CLINIC_OR_DEPARTMENT_OTHER): Payer: Federal, State, Local not specified - PPO | Admitting: Internal Medicine

## 2019-10-07 DIAGNOSIS — N3041 Irradiation cystitis with hematuria: Secondary | ICD-10-CM | POA: Diagnosis not present

## 2019-10-07 NOTE — Progress Notes (Signed)
KINSER, BIESE ([id number]) Visit Report for 10/07/2019 Arrival Information Details Patient Name: Date of Service: DERREK, CAVNESS 10/07/2019 1:00 PM Medical Record [id number] Patient Account Number: [id number] Date of Birth/Sex: Treating RN: 04/01/1956 (63 y.o. M) Primary Care Alissia Lory: [name] Other Clinician: Mikeal Hawthorne Referring Bruk Tumolo: Treating Hulan Szumski/Extender:[name], [name], [name] in Treatment: 3 Visit Information History Since Last Visit Added or deleted any medications: No Patient Arrived: Ambulatory Any new allergies or adverse reactions: No Arrival Time: 12:50 Had a fall or experienced change in No Accompanied By: self activities of daily living that may affect Transfer Assistance: None risk of falls: Patient Identification Verified: Yes Signs or symptoms of abuse/neglect since last No Secondary Verification Process Yes visito Completed: Hospitalized since last visit: No Patient Requires Transmission-Based No Implantable device outside of the clinic excluding No Precautions: cellular tissue based products placed in the center Patient Has Alerts: No since last visit: Pain Present Now: No Electronic Signature(s) Signed: 10/07/2019 3:46:47 PM By: Mikeal Hawthorne EMT/HBOT Entered By: Mikeal Hawthorne on 10/07/2019 13:43:15 -------------------------------------------------------------------------------- Encounter Discharge Information Details Patient Name: Date of Service: [name] 10/07/2019 1:00 PM Medical Record [id number] Patient Account Number: [id number] Date of Birth/Sex: Treating RN: [birth year] (63 y.o. M) Primary Care Taraoluwa Thakur: [name] Other Clinician: Mikeal Hawthorne Referring Shafter Jupin: Treating Rome Schlauch/Extender:[name], [name], [name] in Treatment: 3 Encounter Discharge Information Items Discharge Condition: Stable Ambulatory Status: Ambulatory Discharge Destination:  Home Transportation: Private Auto Accompanied By: self Schedule Follow-up Appointment: Yes Clinical Summary of Care: Patient Declined Electronic Signature(s) Signed: 10/07/2019 3:46:47 PM By: Mikeal Hawthorne EMT/HBOT Entered By: Mikeal Hawthorne on 10/07/2019 15:41:35 -------------------------------------------------------------------------------- Patient/Caregiver Education Details Patient Name: Date of Service: [name] 12/4/2020andnbsp1:00 PM Medical Record [id number] Patient Account Number: [id number] Date of Birth/Gender: Treating RN: 1956-10-13 (63 y.o. M) Primary Care Physician: [name] Other Clinician: Mikeal Hawthorne Referring Physician: Treating Physician/Extender:[name], [name], [name] in Treatment: 3 Education Assessment Education Provided To: Patient Education Topics Provided Hyperbaric Oxygenation: Methods: Explain/Verbal Responses: State content correctly Electronic Signature(s) Signed: 10/07/2019 3:46:47 PM By: Mikeal Hawthorne EMT/HBOT Entered By: Mikeal Hawthorne on 10/07/2019 15:41:20 -------------------------------------------------------------------------------- Vitals Details Patient Name: Date of Service: [name] 10/07/2019 1:00 PM Medical Record [id number] Patient Account Number: [id number] Date of Birth/Sex: Treating RN: 05-21-1956 (63 y.o. M) Primary Care Margrett Kalb: [name] Other Clinician: Mikeal Hawthorne Referring Bernd Crom: Treating Kohl Polinsky/Extender:[name], [name], [name] in Treatment: 3 Vital Signs Time Taken: 12:55 Temperature (F): 97.9 Pulse (bpm): 85 Respiratory Rate (breaths/min): 19 Blood Pressure (mmHg): 160/84 Reference Range: 80 - 120 mg / dl Electronic Signature(s) Signed: 10/07/2019 3:46:47 PM By: Mikeal Hawthorne EMT/HBOT Entered By: Mikeal Hawthorne on 10/07/2019 13:43:38

## 2019-10-07 NOTE — Progress Notes (Signed)
[name] [name], [name] [name] ([id number]) Visit Report for 10/06/2019 SuperBill Details Patient Name: Date of Service: [name] [name], [name] [name] 10/06/2019 Medical Record [id number] Patient Account Number: [id number] Date of Birth/Sex: Treating RN: [birth year] (63 y.o. 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

## 2019-10-07 NOTE — Progress Notes (Signed)
ALIK, HEINKE ([id number]) Visit Report for 10/07/2019 SuperBill Details Patient Name: Date of Service: NAYEL, WARNCKE 10/07/2019 Medical Record [id number] Patient Account Number: [id number] Date of Birth/Sex: Treating RN: 09-04-1956 (63 y.o. 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

## 2019-10-07 NOTE — Progress Notes (Addendum)
DAXEN, SCOLLON ([id number]) Visit Report for 10/07/2019 HBO Details Patient Name: Date of Service: [name] [name], [name] [name] 10/07/2019 1:00 PM Medical Record [id number] Patient Account Number: [id number] Date of Birth/Sex: Treating RN: Jan 11, 1956 (63 y.o. M) Primary Care Cortavious Nix: [name] Other Clinician: Mikeal Hawthorne Referring Shaya Reddick: Treating Aarian Griffie/Extender:[name], [name], [name] in Treatment: 3 HBO Treatment Course Details Treatment Course Number: 1 Ordering Miaya Lafontant: [name] Total Treatments Ordered: 40 HBO Treatment Start Date: 09/19/2019 HBO Indication: Late Effect of Radiation HBO Treatment Details Treatment Number: 13 Patient Type: Outpatient Chamber Type: Monoplace Chamber Serial #: [id number] Treatment Protocol: 2.5 ATA with 90 minutes oxygen, with two 5 minute air breaks Treatment Details Compression Rate Down: 2.0 psi / minute De-Compression Rate Up: 2.0 psi / minute Air breaks and CompressTx Pressure breathing periods DecompressDecompress Begins Reached (leave unused spaces Begins Ends blank) Chamber Pressure (ATA)1 2.5 2.5 2.5 2.5 2.5 --2.5 1 Clock Time (24 hr) 13:03 13:15 13:4513:[id number]:2014:25--14:55 15:07 Treatment Length: 124 (minutes) Treatment Segments: 4 Vital Signs Capillary Blood Glucose Reference Range: 80 - 120 mg / dl HBO Diabetic Blood Glucose Intervention Range: <131 mg/dl or >249 mg/dl Time Vitals Blood Respiratory Capillary Blood Glucose Pulse Action Type: Pulse: Temperature: Taken: Pressure: Rate: Glucose (mg/dl): Meter #: Oximetry (%) Taken: Pre 12:55 160/84 85 19 97.9 Post 15:10 159/87 72 16 98 Treatment Response Treatment Toleration: Well Treatment Completion Treatment Completed without Adverse Event Status: Electronic Signature(s) Signed: 10/07/2019 3:46:47 PM By: Mikeal Hawthorne EMT/HBOT Signed: 10/07/2019 6:02:04 PM By: [name] MD Entered By: Mikeal Hawthorne on 10/07/2019  15:40:58 -------------------------------------------------------------------------------- HBO Safety Checklist Details Patient Name: Date of Service: [name] [name] 10/07/2019 1:00 PM Medical Record [id number] Patient Account Number: [id number] Date of Birth/Sex: Treating RN: 09/17/1956 (63 y.o. M) Primary Care Laprecious Austill: [name] Other Clinician: Mikeal Hawthorne Referring Miyana Mordecai: Treating Nicholaus Steinke/Extender:[name], [name], [name] in Treatment: 3 HBO Safety Checklist Items Safety Checklist Consent Form Signed Patient voided / foley secured and emptied When did you last eato n/a Last dose of injectable or oral agent n/a NA Ostomy pouch emptied and vented if applicable NA All implantable devices assessed, documented and approved NA Intravenous access site secured and place Valuables secured Linens and cotton and cotton/polyester blend (less than 51% polyester) Personal oil-based products / skin lotions / body lotions removed NA Wigs or hairpieces removed NA Smoking or tobacco materials removed Books / newspapers / magazines / loose paper removed Cologne, aftershave, perfume and deodorant removed Jewelry removed (may wrap wedding band) NA Make-up removed Hair care products removed NA Battery operated devices (external) removed NA Heating patches and chemical warmers removed NA Titanium eyewear removed NA Nail polish cured greater than 10 hours NA Casting material cured greater than 10 hours NA Hearing aids removed NA Loose dentures or partials removed NA Prosthetics have been removed Patient demonstrates correct use of air break device (if applicable) Patient concerns have been addressed Patient grounding bracelet on and cord attached to chamber Specifics for Inpatients (complete in addition to above) Medication sheet sent with patient Intravenous medications needed or due during therapy sent with patient Drainage tubes (e.g. nasogastric  tube or chest tube secured and vented) Endotracheal or Tracheotomy tube secured Cuff deflated of air and inflated with saline Airway suctioned Electronic Signature(s) Signed: 10/07/2019 1:44:29 PM By: Mikeal Hawthorne EMT/HBOT Entered By: Mikeal Hawthorne on 10/07/2019 13:44:29

## 2019-10-10 ENCOUNTER — Other Ambulatory Visit: Payer: Self-pay

## 2019-10-10 ENCOUNTER — Encounter (HOSPITAL_BASED_OUTPATIENT_CLINIC_OR_DEPARTMENT_OTHER): Payer: Federal, State, Local not specified - PPO | Admitting: Internal Medicine

## 2019-10-10 DIAGNOSIS — N3041 Irradiation cystitis with hematuria: Secondary | ICD-10-CM | POA: Diagnosis not present

## 2019-10-10 NOTE — Progress Notes (Addendum)
[name], [name] ([id number]) Visit Report for 10/10/2019 HBO Details Patient Name: Date of Service: [name] [name], [name] [name] 10/10/2019 1:00 PM Medical Record [id number] Patient Account Number: [id number] Date of Birth/Sex: Treating RN: 10-23-1956 (63 y.o. M) Primary Care Goodwin Kamphaus: [name] Other Clinician: Mikeal Hawthorne Referring Ahnyla Mendel: Treating Rabecka Brendel/Extender:[name], [name], [name] in Treatment: 3 HBO Treatment Course Details Treatment Course Number: 1 Ordering Shanecia Hoganson: [name] Total Treatments Ordered: 40 HBO Treatment Start Date: 09/19/2019 HBO Indication: Late Effect of Radiation HBO Treatment Details Treatment Number: 14 Patient Type: Outpatient Chamber Type: Monoplace Chamber Serial #: [id number] Treatment Protocol: 2.5 ATA with 90 minutes oxygen, with two 5 minute air breaks Treatment Details Compression Rate Down: 2.0 psi / minute De-Compression Rate Up: 2.0 psi / minute Air breaks and CompressTx Pressure breathing periods DecompressDecompress Begins Reached (leave unused spaces Begins Ends blank) Chamber Pressure (ATA)1 2.5 2.5 2.5 2.5 2.5 --2.5 1 Clock Time (24 hr) 13:04 13:16 13:4613:5114:2114:26--14:56 15:08 Treatment Length: 124 (minutes) Treatment Segments: 4 Vital Signs Capillary Blood Glucose Reference Range: 80 - 120 mg / dl HBO Diabetic Blood Glucose Intervention Range: <131 mg/dl or >249 mg/dl Time Vitals Blood Respiratory Capillary Blood Glucose Pulse Action Type: Pulse: Temperature: Taken: Pressure: Rate: Glucose (mg/dl): Meter #: Oximetry (%) Taken: Pre 13:00 185/85 75 16 98.3 Post 15:10 168/76 74 14 98 Treatment Response Treatment Toleration: Well Treatment Completion Treatment Completed without Adverse Event Status: Alvie Fowles Notes No concerns with treatment given Physician HBO Attestation: I certify that I supervised this HBO treatment in accordance with Medicare guidelines. A  trained Yes Yes emergency response team is readily available per hospital policies and procedures. Continue HBOT as ordered. Yes Electronic Signature(s) Signed: 10/11/2019 10:01:17 AM By: [name] MD Entered By: [name] on 10/10/2019 17:29:16 -------------------------------------------------------------------------------- HBO Safety Checklist Details Patient Name: Date of Service: [name] [name] 10/10/2019 1:00 PM Medical Record [id number] Patient Account Number: [id number] Date of Birth/Sex: Treating RN: September 01, 1956 (63 y.o. M) Primary Care Veleka Djordjevic: [name] Other Clinician: Mikeal Hawthorne Referring Jasai Sorg: Treating Jeidy Hoerner/Extender:[name], [name], [name] in Treatment: 3 HBO Safety Checklist Items Safety Checklist Consent Form Signed Patient voided / foley secured and emptied When did you last eato n/a Last dose of injectable or oral agent n/a NA Ostomy pouch emptied and vented if applicable NA All implantable devices assessed, documented and approved NA Intravenous access site secured and place Valuables secured Linens and cotton and cotton/polyester blend (less than 51% polyester) Personal oil-based products / skin lotions / body lotions removed NA Wigs or hairpieces removed NA Smoking or tobacco materials removed Books / newspapers / magazines / loose paper removed Cologne, aftershave, perfume and deodorant removed Jewelry removed (may wrap wedding band) NA Make-up removed Hair care products removed NA Battery operated devices (external) removed NA Heating patches and chemical warmers removed NA Titanium eyewear removed NA Nail polish cured greater than 10 hours NA Casting material cured greater than 10 hours NA Hearing aids removed NA Loose dentures or partials removed NA Prosthetics have been removed Patient demonstrates correct use of air break device (if applicable) Patient concerns have been  addressed Patient grounding bracelet on and cord attached to chamber Specifics for Inpatients (complete in addition to above) Medication sheet sent with patient Intravenous medications needed or due during therapy sent with patient Drainage tubes (e.g. nasogastric tube or chest tube secured and vented) Endotracheal or Tracheotomy tube secured Cuff deflated of air and inflated with saline Airway suctioned Electronic Signature(s) Signed: 10/10/2019 1:13:29  PM By: Mikeal Hawthorne EMT/HBOT Entered By: Mikeal Hawthorne on 10/10/2019 13:13:28

## 2019-10-11 ENCOUNTER — Encounter (HOSPITAL_BASED_OUTPATIENT_CLINIC_OR_DEPARTMENT_OTHER): Payer: Federal, State, Local not specified - PPO | Admitting: Internal Medicine

## 2019-10-11 ENCOUNTER — Other Ambulatory Visit: Payer: Self-pay

## 2019-10-11 DIAGNOSIS — N3041 Irradiation cystitis with hematuria: Secondary | ICD-10-CM | POA: Diagnosis not present

## 2019-10-11 NOTE — Progress Notes (Signed)
LIAHM, TREMONTI ([id number]) Visit Report for 10/10/2019 Arrival Information Details Patient Name: Date of Service: [name], [name] 10/10/2019 1:00 PM Medical Record [id number] Patient Account Number: [id number] Date of Birth/Sex: Treating RN: [birth year] (63 y.o. M) Primary Care Xitlali Kastens: [name] Other Clinician: Mikeal Hawthorne Referring Shilo Pauwels: Treating Merwyn Hodapp/Extender:[name], [name], [name] in Treatment: 3 Visit Information History Since Last Visit Added or deleted any medications: No Patient Arrived: Ambulatory Any new allergies or adverse reactions: No Arrival Time: 12:55 Had a fall or experienced change in No Accompanied By: self activities of daily living that may affect Transfer Assistance: None risk of falls: Patient Identification Verified: Yes Signs or symptoms of abuse/neglect since last No Secondary Verification Process Yes visito Completed: Hospitalized since last visit: No Patient Requires Transmission-Based No Implantable device outside of the clinic excluding No Precautions: cellular tissue based products placed in the center Patient Has Alerts: No since last visit: Pain Present Now: No Electronic Signature(s) Signed: 10/11/2019 4:34:42 PM By: Mikeal Hawthorne EMT/HBOT Entered By: Mikeal Hawthorne on 10/10/2019 13:06:46 -------------------------------------------------------------------------------- Encounter Discharge Information Details Patient Name: Date of Service: [name] [name] 10/10/2019 1:00 PM Medical Record [id number] Patient Account Number: [id number] Date of Birth/Sex: Treating RN: [birth year] (63 y.o. M) Primary Care Concepcion Kirkpatrick: [name] Other Clinician: Mikeal Hawthorne Referring Catriona Dillenbeck: Treating Malan Werk/Extender:[name], [name], [name] in Treatment: 3 Encounter Discharge Information Items Discharge Condition: Stable Ambulatory Status: Ambulatory Discharge Destination:  Home Transportation: Private Auto Accompanied By: self Schedule Follow-up Appointment: Yes Clinical Summary of Care: Patient Declined Electronic Signature(s) Signed: 10/11/2019 4:34:42 PM By: Mikeal Hawthorne EMT/HBOT Entered By: Mikeal Hawthorne on 10/10/2019 15:38:11 -------------------------------------------------------------------------------- Patient/Caregiver Education Details Patient Name: Date of Service: [name] [name] 12/7/2020andnbsp1:00 PM Medical Record [phone] Patient Account Number: [id number] Date of Birth/Gender: Treating RN: 09-Aug-1956 (63 y.o. M) Primary Care Physician: [name] Other Clinician: Mikeal Hawthorne Referring Physician: Treating Physician/Extender:[name], [name], [name] in Treatment: 3 Education Assessment Education Provided To: Patient Education Topics Provided Hyperbaric Oxygenation: Methods: Explain/Verbal Responses: State content correctly Electronic Signature(s) Signed: 10/11/2019 4:34:42 PM By: Mikeal Hawthorne EMT/HBOT Entered By: Mikeal Hawthorne on 10/10/2019 15:37:57 -------------------------------------------------------------------------------- Vitals Details Patient Name: Date of Service: [name] [name] 10/10/2019 1:00 PM Medical Record [id number] Patient Account Number: [id number] Date of Birth/Sex: Treating RN: [birth year] (63 y.o. M) Primary Care Gabbrielle Mcnicholas: [name] Other Clinician: Mikeal Hawthorne Referring Skyylar Kopf: Treating Flornce Record/Extender:[name], [name], [name] in Treatment: 3 Vital Signs Time Taken: 13:00 Temperature (F): 98.3 Pulse (bpm): 75 Respiratory Rate (breaths/min): 16 Blood Pressure (mmHg): 185/85 Reference Range: 80 - 120 mg / dl Electronic Signature(s) Signed: 10/11/2019 4:34:42 PM By: Mikeal Hawthorne EMT/HBOT Entered By: Mikeal Hawthorne on 10/10/2019 13:12:51

## 2019-10-11 NOTE — Progress Notes (Addendum)
[name] [name], [name] [name] ([id number]) Visit Report for 10/11/2019 HBO Details Patient Name: Date of Service: [name] [name], [name] [name] 10/11/2019 1:00 PM Medical Record [id number] Patient Account Number: [id number] Date of Birth/Sex: Treating RN: [birth year] (63 y.o. M) Primary Care Jayan Raymundo: [name] Other Clinician: Mikeal Hawthorne Referring Alonnah Lampkins: Treating Holy Battenfield/Extender:[name], [name], [name] in Treatment: 4 HBO Treatment Course Details Treatment Course Number: 1 Ordering [place] Nol[place]: [name] Total Treatments Ordered: 40 HBO Treatment Start Date: 09/19/2019 HBO Indication: Late Effect of Radiation HBO Treatment Details Treatment Number: 15 Patient Type: Outpatient Chamber Type: Monoplace Chamber Serial #: [id number] Treatment Protocol: 2.5 ATA with 90 minutes oxygen, with two 5 minute air breaks Treatment Details Compression Rate Down: 2.0 psi / minute De-Compression Rate Up: 2.0 psi / minute Air breaks and CompressTx Pressure breathing periods DecompressDecompress Begins Reached (leave unused spaces Begins Ends blank) Chamber Pressure (ATA)1 2.5 2.5 2.5 2.5 2.5 --2.5 1 Clock Time (24 hr) 13:00 13:12 13:4213:4714:1714:22--14:52 15:04 Treatment Length: 124 (minutes) Treatment Segments: 4 Vital Signs Capillary Blood Glucose Reference Range: 80 - 120 mg / dl HBO Diabetic Blood Glucose Intervention Range: <131 mg/dl or >249 mg/dl Time Vitals Blood Respiratory Capillary Blood Glucose Pulse Action Type: Pulse: Temperature: Taken: Pressure: Rate: Glucose (mg/dl): Meter #: Oximetry (%) Taken: Pre 12:55 173/78 77 17 98.6 Post 15:07 148/87 66 15 98 Treatment Response Treatment Toleration: Well Treatment Completion Treatment Completed without Adverse Event Status: [name] Notes No concerns with treatment given Physician HBO Attestation: I certify that I supervised this HBO treatment in accordance with Medicare guidelines. A  trained Yes Yes emergency response team is readily available per hospital policies and procedures. Continue HBOT as ordered. Yes Electronic Signature(s) Signed: 10/11/2019 5:53:37 PM By: [name] MD Previous Signature: 10/11/2019 4:34:42 PM Version By: Mikeal Hawthorne EMT/HBOT Entered By: [name] on 10/11/2019 17:50:53 -------------------------------------------------------------------------------- HBO Safety Checklist Details Patient Name: Date of Service: [name] [name] 10/11/2019 1:00 PM Medical Record [id number] Patient Account Number: [id number] Date of Birth/Sex: Treating RN: [birth year] (63 y.o. M) Primary Care Raymar Joiner: [name] Other Clinician: Mikeal Hawthorne Referring Bryson Gavia: Treating Purcell Jungbluth/Extender:[name], [name], [name] in Treatment: 4 HBO Safety Checklist Items Safety Checklist Consent Form Signed Patient voided / foley secured and emptied When did you last eato n/a Last dose of injectable or oral agent n/a NA Ostomy pouch emptied and vented if applicable NA All implantable devices assessed, documented and approved NA Intravenous access site secured and place Valuables secured Linens and cotton and cotton/polyester blend (less than 51% polyester) Personal oil-based products / skin lotions / body lotions removed NA Wigs or hairpieces removed NA Smoking or tobacco materials removed Books / newspapers / magazines / loose paper removed Cologne, aftershave, perfume and deodorant removed Jewelry removed (may wrap wedding band) NA Make-up removed Hair care products removed NA Battery operated devices (external) removed NA Heating patches and chemical warmers removed NA Titanium eyewear removed NA Nail polish cured greater than 10 hours NA Casting material cured greater than 10 hours NA Hearing aids removed NA Loose dentures or partials removed NA Prosthetics have been removed Patient demonstrates correct use of  air break device (if applicable) Patient concerns have been addressed Patient grounding bracelet on and cord attached to chamber Specifics for Inpatients (complete in addition to above) Medication sheet sent with patient Intravenous medications needed or due during therapy sent with patient Drainage tubes (e.g. nasogastric tube or chest tube secured and vented) Endotracheal or Tracheotomy tube secured Cuff deflated of air and  inflated with saline Airway suctioned Electronic Signature(s) Signed: 10/11/2019 1:35:12 PM By: Mikeal Hawthorne EMT/HBOT Entered By: Mikeal Hawthorne on 10/11/2019 13:35:11

## 2019-10-11 NOTE — Progress Notes (Signed)
ISON, GAIDA ([id number]) Visit Report for 09/13/2019 Allergy List Details Patient Name: Date of Service: DEQUAWN, KOZIKOWSKI 09/13/2019 9:45 AM Medical Record [id number] Patient Account Number: [id number] Date of Birth/Sex: Treating RN: 1956-02-13 (63 y.o. [name] Primary Care Floria Brandau: [name] Other Clinician: Referring Demetruis Depaul: Treating Jasmine Mcbeth/Extender:[name], [name], [name] in Treatment: 0 Allergies Active Allergies tetracycline Reaction: rash Severity: Moderate Allergy Notes Electronic Signature(s) Signed: 09/19/2019 5:59:11 PM By: Levan Hurst RN, BSN Entered By: Levan Hurst on 09/13/2019 09:47:07 -------------------------------------------------------------------------------- Arrival Information Details Patient Name: Date of Service: [name] 09/13/2019 9:45 AM Medical Record [id number] Patient Account Number: [id number] Date of Birth/Sex: Treating RN: 07-17-1956 (63 y.o. [name] Primary Care Perrion Diesel: [name] Other Clinician: Referring Deztinee Lohmeyer: Treating Deven Audi/Extender:[name], [name], [name] in Treatment: 0 Visit Information Patient Arrived: Ambulatory Arrival Time: 09:43 Accompanied By: wife Transfer Assistance: None Patient Identification Verified: Yes Secondary Verification Process Completed: Yes Patient Requires Transmission-Based No Precautions: Patient Has Alerts: No Electronic Signature(s) Signed: 09/19/2019 5:59:11 PM By: Levan Hurst RN, BSN Entered By: Levan Hurst on 09/13/2019 09:46:08 -------------------------------------------------------------------------------- Clinic Level of Care Assessment Details Patient Name: Date of Service: NATHANYEL, BURCHILL 09/13/2019 9:45 AM Medical Record [id number] Patient Account Number: [id number] Date of Birth/Sex: Treating RN: Aug 28, 1956 (63 y.o. [name]) Carlene Coria Primary Care Annamay Laymon: [name] Other Clinician: Referring Izzak Fries: Treating Kyson Kupper/Extender:[name], [name], [name] in Treatment: 0 Clinic Level of Care Assessment Items TOOL 2 Quantity Score []  - Use when only an EandM is performed on the INITIAL visit 0 ASSESSMENTS - Nursing Assessment / Reassessment X - General Physical Exam (combine w/ comprehensive assessment (listed just below) 1 20 when performed on new pt. evals) X - Comprehensive Assessment (HX, ROS, Risk Assessments, Wounds Hx, etc.) 1 25 ASSESSMENTS - Wound and Skin Assessment / Reassessment []  - Simple Wound Assessment / Reassessment - one wound 0 []  - Complex Wound Assessment / Reassessment - multiple wounds 0 []  - Dermatologic / Skin Assessment (not related to wound area) 0 ASSESSMENTS - Ostomy and/or Continence Assessment and Care []  - Incontinence Assessment and Management 0 []  - Ostomy Care Assessment and Management (repouching, etc.) 0 PROCESS - Coordination of Care X - Simple Patient / Family Education for ongoing care 1 15 []  - Complex (extensive) Patient / Family Education for ongoing care 0 X - Staff obtains Programmer, systems, Records, Test Results / Process Orders 1 10 []  - Staff telephones HHA, Nursing Homes / Clarify orders / etc 0 []  - Routine Transfer to another Facility (non-emergent condition) 0 []  - Routine Hospital Admission (non-emergent condition) 0 X - New Admissions / Biomedical engineer / Ordering NPWT, Apligraf, etc. 1 15 []  - Emergency Hospital Admission (emergent condition) 0 X - Simple Discharge Coordination 1 10 []  - Complex (extensive) Discharge Coordination 0 PROCESS - Special Needs []  - Pediatric / Minor Patient Management 0 []  - Isolation Patient Management 0 []  - Hearing / Language / Visual special needs 0 []  - Assessment of Community assistance (transportation, D/C planning, etc.) 0 []  - Additional assistance / Altered mentation 0 []  - Support Surface(s) Assessment (bed, cushion, seat,  etc.) 0 INTERVENTIONS - Wound Cleansing / Measurement []  - Wound Imaging (photographs - any number of wounds) 0 []  - Wound Tracing (instead of photographs) 0 []  - Simple Wound Measurement - one wound 0 []  - Complex Wound Measurement - multiple wounds 0 []  - Simple Wound Cleansing - one wound 0 []  - Complex  Wound Cleansing - multiple wounds 0 INTERVENTIONS - Wound Dressings []  - Small Wound Dressing one or multiple wounds 0 []  - Medium Wound Dressing one or multiple wounds 0 []  - Large Wound Dressing one or multiple wounds 0 []  - Application of Medications - injection 0 INTERVENTIONS - Miscellaneous []  - External ear exam 0 []  - Specimen Collection (cultures, biopsies, blood, body fluids, etc.) 0 []  - Specimen(s) / Culture(s) sent or taken to Lab for analysis 0 []  - Patient Transfer (multiple staff / Harrel Lemon Lift / Similar devices) 0 []  - Simple Staple / Suture removal (25 or less) 0 []  - Complex Staple / Suture removal (26 or more) 0 []  - Hypo / Hyperglycemic Management (close monitor of Blood Glucose) 0 []  - Ankle / Brachial Index (ABI) - do not check if billed separately 0 Has the patient been seen at the hospital within the last three years: Yes Total Score: 95 Level Of Care: New/Established - Level 3 Electronic Signature(s) Signed: 10/11/2019 2:53:59 PM By: Carlene Coria RN Entered By: Carlene Coria on 09/13/2019 11:09:32 -------------------------------------------------------------------------------- Multi-Disciplinary Care Plan Details Patient Name: Date of Service: [name] 09/13/2019 9:45 AM Medical Record [id number] Patient Account Number: [id number] Date of Birth/Sex: Treating RN: May 04, 1956 (63 y.o. [name]) Carlene Coria Primary Care Tyhir Schwan: [name] Other Clinician: Referring Isayah Ignasiak: Treating Cierah Crader/Extender:[name], [name], [name] in Treatment: 0 Active Inactive HBO Nursing Diagnoses: Potential for barotraumas to ears,  sinuses, teeth, and lungs or cerebral gas embolism related to changes in atmospheric pressure inside hyperbaric oxygen chamber Potential for oxygen toxicity seizures related to delivery of 100% oxygen at an increased atmospheric pressure Potential for pulmonary oxygen toxicity related to delivery of 100% oxygen at an increased atmospheric pressure Goals: Barotrauma will be prevented during HBO2 Date Initiated: 09/13/2019 Target Resolution Date: 10/14/2019 Goal Status: Active Patient and/or family will be able to state/discuss factors appropriate to the management of their disease process during treatment Date Initiated: 09/13/2019 Target Resolution Date: 10/14/2019 Goal Status: Active Patient will tolerate the hyperbaric oxygen therapy treatment Date Initiated: 09/13/2019 Target Resolution Date: 10/14/2019 Goal Status: Active Patient will tolerate the internal climate of the chamber Date Initiated: 09/13/2019 Target Resolution Date: 10/14/2019 Goal Status: Active Interventions: Administer decongestants, per physician orders, prior to HBO2 Administer the correct therapeutic gas delivery based on the patients needs and limitations, per physician order Assess and provide for patients comfort related to the hyperbaric environment and equalization of middle ear Assess for signs and symptoms related to adverse events, including but not limited to confinement anxiety, pneumothorax, oxygen toxicity and baurotrauma Assess patient for any history of confinement anxiety Assess patient's knowledge and expectations regarding hyperbaric medicine and provide education related to the hyperbaric environment, goals of treatment and prevention of adverse events Notes: Electronic Signature(s) Signed: 10/11/2019 2:53:59 PM By: Carlene Coria RN Entered By: Carlene Coria on 09/13/2019 10:43:18 -------------------------------------------------------------------------------- Pain Assessment Details Patient  Name: Date of Service: LADAVION, LONGMIRE 09/13/2019 9:45 AM Medical Record [id number] Patient Account Number: [id number] Date of Birth/Sex: Treating RN: [birth year] (63 y.o. [name] Primary Care Nesha Counihan: [name] Other Clinician: Referring Eriyah Fernando: Treating Kyndra Condron/Extender:[name], [name], [name] in Treatment: 0 Active Problems Location of Pain Severity and Description of Pain Patient Has Paino No Site Locations Pain Management and Medication Current Pain Management: Electronic Signature(s) Signed: 09/19/2019 5:59:11 PM By: Levan Hurst RN, BSN Entered By: Levan Hurst on 09/13/2019 10:03:38 -------------------------------------------------------------------------------- Patient/Caregiver Education Details Patient Name: [name] 11/10/2020andnbsp9:45 Patient  Name: CLIVE, DISHAW 11/10/2020andnbsp9:45 Date of Service: AM Medical Record [id number] Number: Patient Account Number: [id number] Treating RN: 03/21/1956 (63 y.o. Carlene Coria Date of Birth/Gender: M) Other Clinician: Primary Care Physician: [name] Treating [name] Referring Physician: Physician/Extender: [name] in Treatment: 0 Education Assessment Education Provided To: Patient Education Topics Provided Hyperbaric Oxygenation: Methods: Explain/Verbal Responses: State content correctly Electronic Signature(s) Signed: 10/11/2019 2:53:59 PM By: Carlene Coria RN Entered By: Carlene Coria on 09/13/2019 10:43:34 -------------------------------------------------------------------------------- Vitals Details Patient Name: Date of Service: [name] 09/13/2019 9:45 AM Medical Record [id number] Patient Account Number: [id number] Date of Birth/Sex: Treating RN: 06/29/1956 (63 y.o. [name] Primary Care Dempsey Ahonen: [name] Other Clinician: Referring Kimm Sider: Treating Myles Mallicoat/Extender:[name],  [name], [name] in Treatment: 0 Vital Signs Time Taken: 09:45 Temperature (F): 98.3 Pulse (bpm): 74 Respiratory Rate (breaths/min): 18 Blood Pressure (mmHg): 113/59 Reference Range: 80 - 120 mg / dl Electronic Signature(s) Signed: 09/19/2019 5:59:11 PM By: Levan Hurst RN, BSN Entered By: Levan Hurst on 09/13/2019 10:16:15

## 2019-10-11 NOTE — Progress Notes (Signed)
MENELIK, KOSIK ([id number]) Visit Report for 10/11/2019 SuperBill Details Patient Name: Date of Service: TIELER, MARKHAM 10/11/2019 Medical Record [id number] Patient Account Number: [id number] Date of Birth/Sex: Treating RN: 1955/12/08 (63 y.o. 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

## 2019-10-11 NOTE — Progress Notes (Signed)
[name] [name], [name] [name] ([id number]) Visit Report for 10/11/2019 Arrival Information Details Patient Name: Date of Service: [name] [name], [name] [name] 10/11/2019 1:00 PM Medical Record [id number] Patient Account Number: [id number] Date of Birth/Sex: Treating RN: [birth year] (64 y.o. M) Primary Care Lysander Calixte: [name] Other Clinician: Mikeal Hawthorne Referring Ivee Poellnitz: Treating Kielyn Kardell/Extender:[name], [name], [name] in Treatment: 4 Visit Information History Since Last Visit Added or deleted any medications: No Patient Arrived: Ambulatory Any new allergies or adverse reactions: No Arrival Time: 12:50 Had a fall or experienced change in No Accompanied By: self activities of daily living that may affect Transfer Assistance: None risk of falls: Patient Identification Verified: Yes Signs or symptoms of abuse/neglect since last No Secondary Verification Process Yes visito Completed: Hospitalized since last visit: No Patient Requires Transmission-Based No Implantable device outside of the clinic excluding No Precautions: cellular tissue based products placed in the center Patient Has Alerts: No since last visit: Pain Present Now: No Electronic Signature(s) Signed: 10/11/2019 4:34:42 PM By: Mikeal Hawthorne EMT/HBOT Entered By: Mikeal Hawthorne on 10/11/2019 13:34:22 -------------------------------------------------------------------------------- Encounter Discharge Information Details Patient Name: Date of Service: [name] [name] 10/11/2019 1:00 PM Medical Record [id number] Patient Account Number: [id number] Date of Birth/Sex: Treating RN: [birth year] (63 y.o. M) Primary Care Dane Bloch: [name] Other Clinician: Mikeal Hawthorne Referring Brinklee Cisse: Treating Kamya Watling/Extender:[name], [name], [name] in Treatment: 4 Encounter Discharge Information Items Discharge Condition: Stable Ambulatory Status: Ambulatory Discharge Destination:  Home Transportation: Private Auto Accompanied By: self Schedule Follow-up Appointment: Yes Clinical Summary of Care: Patient Declined Electronic Signature(s) Signed: 10/11/2019 4:34:42 PM By: Mikeal Hawthorne EMT/HBOT Entered By: Mikeal Hawthorne on 10/11/2019 15:42:44 -------------------------------------------------------------------------------- Patient/Caregiver Education Details Patient Name: Date of Service: [name] [name] 12/8/2020andnbsp1:00 PM Medical Record [phone] Patient Account Number: [id number] Date of Birth/Gender: Treating RN: [birth year] (63 y.o. M) Primary Care Physician: [name] Other Clinician: Mikeal Hawthorne Referring Physician: Treating Physician/Extender:[name], [name], [name] in Treatment: 4 Education Assessment Education Provided To: Patient Education Topics Provided Hyperbaric Oxygenation: Methods: Explain/Verbal Responses: State content correctly Electronic Signature(s) Signed: 10/11/2019 4:34:42 PM By: Mikeal Hawthorne EMT/HBOT Entered By: Mikeal Hawthorne on 10/11/2019 15:42:31 -------------------------------------------------------------------------------- Vitals Details Patient Name: Date of Service: [name] [name] 10/11/2019 1:00 PM Medical Record [id number] Patient Account Number: [id number] Date of Birth/Sex: Treating RN: [birth year] (63 y.o. M) Primary Care Sharesa Kemp: [name] Other Clinician: Mikeal Hawthorne Referring Mykael Trott: Treating Saxton Chain/Extender:[name], [name], [name] in Treatment: 4 Vital Signs Time Taken: 12:55 Temperature (F): 98.6 Pulse (bpm): 77 Respiratory Rate (breaths/min): 17 Blood Pressure (mmHg): 173/78 Reference Range: 80 - 120 mg / dl Electronic Signature(s) Signed: 10/11/2019 4:34:42 PM By: Mikeal Hawthorne EMT/HBOT Entered By: Mikeal Hawthorne on 10/11/2019 13:34:39

## 2019-10-11 NOTE — Progress Notes (Signed)
ARYEL, MINICUCCI ([id number]) Visit Report for 09/13/2019 Chief Complaint Document Details Patient Name: Date of Service: IDEN, MASSETT 09/13/2019 9:45 AM Medical Record [id number] Patient Account Number: [id number] Date of Birth/Sex: Treating RN: 09-May-1956 (63 y.o. [name]) Carlene Coria Primary Care Provider: [name] Other Clinician: Referring Provider: Treating Provider/Extender:Jurnei Latini, [name], [name] in Treatment: 0 Information Obtained from: Patient Chief Complaint 09/13/2019; patient is here for review of gross hematuria in the setting of previous radiation treatment for prostate cancer. Consideration of HBO Electronic Signature(s) Signed: 09/13/2019 5:17:49 PM By: [name] MD Entered By: [name] on 09/13/2019 07:29:49 -------------------------------------------------------------------------------- HPI Details Patient Name: Date of Service: [name] 09/13/2019 9:45 AM Medical Record [id number] Patient Account Number: [id number] Date of Birth/Sex: Treating RN: [birth year] (63 y.o. Oval Linsey Primary Care Provider: [name] Other Clinician: Referring Provider: Treating Provider/Extender:Cecil Vandyke, [name], [name] in Treatment: 0 History of Present Illness HPI Description: ADMISSION 09/13/2019 This is a 63 year old man who was treated with robotic radical prostatectomy and lymphadenectomy 07/2014 for T2cN0Mx Gleason grade 7 adenocarcinoma. In 2017 he was treated for prostate cancer recurrence based I believe on an elevated PSA. He was treated with 68 Gy to the prostate fossa from 12/11/2015 through 01/31/2016. He had 38 fractions of treatment. He has had operative cystoscopy and clot evacuation x2 on 10/2 and 10/18 with radiation cystitis underwent fulguration. He was most recently seen by his urologist Dr. 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 He is on Lupron. The patient on 11/5 reported burning pain with urination, hesitancy straining. He was referred here for consideration of hyperbaric oxygen he is continuing on Lupron. Fortunately his disease appears to be under good control. The patient tells me that his bleeding towards the end of September. He has had 2 cystoscopies in March and bladder fulguration. He has had almost continuous gross hematuria with large clots that episodically make it difficult for him to void. He is not describing dysuria other than when he has a clot obstructing his urethra. He is describing some degree of urgency and hesitancy probably related to again clots making it difficult for him to void. Interestingly the patient's hemoglobin has really plummeted from 15.2 on 9/26-7.5 last night. He was typed and crossed but was not transfused when he was in the ER. The patient was in the emergency room last night with inability to void. He had a Foley catheter placed. He now has a leg bag. Lab work as noted showed a hemoglobin of 7.5 white count of 10 sodium of 129 BUN is 16 and a creatinine slightly elevated from his baseline at 1.11 patient states he has been feeling very weak and tired not his self at all. Past medical history; hypertension. He is not a smoker. Has no cardiac or pulmonary issues Electronic Signature(s) Signed: 09/13/2019 5:17:49 PM By: [name] MD Entered By: [name] on 09/13/2019 11:10:57 -------------------------------------------------------------------------------- Physical Exam Details Patient Name: Date of Service: [name] 09/13/2019 9:45 AM Medical Record [id number] Patient Account Number: [id number] Date of Birth/Sex: Treating RN: 06-Jan-1956 (63 y.o. Oval Linsey Primary Care Provider: [name] Other Clinician: Referring Provider: Treating Provider/Extender:Barth Trella, [name], [name] in Treatment:  0 Constitutional Sitting or standing Blood Pressure is within target range for patient.. Pulse regular and within target range for patient.Marland Kitchen Respirations regular, non-labored and within target range.. Temperature is normal and within  the target range for the patient.Marland Kitchen Appears in no distress. Eyes Conjunctivae clear. No discharge.no icterus. Ears, Nose, Mouth, and Throat Does not look dehydrated. Respiratory work of breathing is normal. Bilateral breath sounds are clear and equal in all lobes with no wheezes, rales or rhonchi.. Cardiovascular Heart rhythm and rate regular, without murmur or gallop.. Genitourinary (GU) Foley catheter in place with grossly bloody urine no suprapubic tenderness. Psychiatric appears at normal baseline. Electronic Signature(s) Signed: 09/13/2019 5:17:49 PM By: [name] MD Entered By: [name] on 09/13/2019 11:11:45 -------------------------------------------------------------------------------- Physician Orders Details Patient Name: Date of Service: LAYNE, BEHRINGER 09/13/2019 9:45 AM Medical Record [id number] Patient Account Number: [id number] Date of Birth/Sex: Treating RN: 1956-03-23 (63 y.o. [name]) Carlene Coria Primary Care Provider: [name] Other Clinician: Referring Provider: Treating Provider/Extender:Belynda Pagaduan, [name], [name] in Treatment: 0 Verbal / Phone Orders: No Diagnosis Coding ICD-10 Coding Code Description N30.41 Irradiation cystitis with hematuria R31.0 Gross hematuria Z85.46 Personal history of malignant neoplasm of prostate Follow-up Appointments Other: - We will call you to start hyperbaric treatments once approval received from insurance Hyperbaric Oxygen Therapy Evaluate for HBO Therapy Indication: - radiation cystitis If appropriate for treatment, begin HBOT per protocol: 2.5 ATA for 90 Minutes with 2 Five (5) Minute Air Breaks Total Number of Treatments: - 40 One treatments per  day (delivered Monday through Friday unless otherwise specified in Special Instructions below): Antihistamine 30 minutes prior to HBO Treatment, difficulty clearing ears. Radiology X-ray, Chest - hyperbaric Oxygen eval - (ICD10 N30.41 - Irradiation cystitis with hematuria) Electronic Signature(s) Signed: 09/13/2019 5:17:49 PM By: [name] MD Signed: 10/11/2019 2:53:59 PM By: Carlene Coria RN Entered By: Carlene Coria on 09/13/2019 11:03:05 -------------------------------------------------------------------------------- Prescription 09/13/2019 Patient Name: [name] Provider: [name] MD Date of Birth: 18-Dec-1955 NPI#: [id number] Sex: [name] DEA#: [id number] Phone #: [phone] License #: [id number] Patient Address: [address], [place] [address] [place], [place] [address] [phone] Allergies tetracycline Reaction: rash Severity: Moderate Provider's Orders X-ray, Chest - ICD10: N30.41 - hyperbaric Oxygen eval Signature(s): Date(s): Electronic Signature(s) Signed: 09/13/2019 5:17:49 PM By: [name] MD Signed: 10/11/2019 2:53:59 PM By: Carlene Coria RN Entered By: Carlene Coria on 09/13/2019 11:03:06 --------------------------------------------------------------------------------  Problem List Details Patient Name: Date of Service: [name] 09/13/2019 9:45 AM Medical Record [id number] Patient Account Number: [id number] Date of Birth/Sex: Treating RN: [birth year] (63 y.o. Oval Linsey Primary Care Provider: [name] Other Clinician: Referring Provider: Treating Provider/Extender:Clariece Roesler, [name], [name] in Treatment: 0 Active Problems ICD-10 Evaluated Encounter Code Description Active Date Today Diagnosis N30.41 Irradiation cystitis with hematuria 09/13/2019 No Yes R31.0 Gross hematuria 09/13/2019 No Yes Z85.46 Personal  history of malignant neoplasm of prostate 09/13/2019 No Yes Inactive Problems Resolved Problems Electronic Signature(s) Signed: 09/13/2019 5:17:49 PM By: [name] MD Entered By: [name] on 09/13/2019 07:28:12 -------------------------------------------------------------------------------- Progress Note Details Patient Name: Date of Service: [name] 09/13/2019 9:45 AM Medical Record [id number] Patient Account Number: [id number] Date of Birth/Sex: Treating RN: June 01, 1956 (63 y.o. Oval Linsey Primary Care Provider: [name] Other Clinician: Referring Provider: Treating Provider/Extender:Osmond Steckman, [name], [name] in Treatment: 0 Subjective Chief Complaint Information obtained from Patient 09/13/2019; patient is here for review of gross hematuria in the setting of previous radiation treatment for prostate cancer. Consideration of HBO History of Present Illness (HPI) ADMISSION 09/13/2019 This is a 63 year old man who was treated  with robotic radical prostatectomy and lymphadenectomy 07/2014 for T2cN0Mx Gleason grade 7 adenocarcinoma. In 2017 he was treated for prostate cancer recurrence based I believe on an elevated PSA. He was treated with 68 Gy to the prostate fossa from 12/11/2015 through 01/31/2016. He had 38 fractions of treatment. He has had operative cystoscopy and clot evacuation x2 on 10/2 and 10/18 with radiation cystitis underwent fulguration. He was most recently seen by his urologist Dr. 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 He is on Lupron. The patient on 11/5 reported burning pain with urination, hesitancy straining. He was referred here for consideration of hyperbaric oxygen he is continuing on Lupron. Fortunately his disease appears to be under good control. The patient tells me that his bleeding towards the end of September. He has had 2  cystoscopies in March and bladder fulguration. He has had almost continuous gross hematuria with large clots that episodically make it difficult for him to void. He is not describing dysuria other than when he has a clot obstructing his urethra. He is describing some degree of urgency and hesitancy probably related to again clots making it difficult for him to void. Interestingly the patient's hemoglobin has really plummeted from 15.2 on 9/26-7.5 last night. He was typed and crossed but was not transfused when he was in the ER. The patient was in the emergency room last night with inability to void. He had a Foley catheter placed. He now has a leg bag. Lab work as noted showed a hemoglobin of 7.5 white count of 10 sodium of 129 BUN is 16 and a creatinine slightly elevated from his baseline at 1.11 patient states he has been feeling very weak and tired not his self at all. Past medical history; hypertension. He is not a smoker. Has no cardiac or pulmonary issues Patient History Information obtained from Patient. Allergies tetracycline (Severity: Moderate, Reaction: rash) Family History Unknown History. Social History Never smoker, Marital Status - Married, Alcohol Use - Never, Drug Use - No History, Caffeine Use - Rarely. Medical History Cardiovascular Patient has history of Hypertension Oncologic Patient has history of Received Radiation - 38 rounds in 2017 Medical And Surgical History Notes Genitourinary Hematuria, urinary retention Oncologic Prostate Cancer 2015, prostate removed Review of Systems (ROS) Constitutional Symptoms ([place]) Denies complaints or symptoms of Fatigue, Fever, Chills, Marked Weight Change. Eyes Denies complaints or symptoms of Dry Eyes, Vision Changes, Glasses / Contacts. Ear/Nose/Mouth/Throat Denies complaints or symptoms of Chronic sinus problems or rhinitis. Respiratory Denies complaints or symptoms of Chronic or frequent coughs, Shortness of  Breath. Gastrointestinal Denies complaints or symptoms of Frequent diarrhea, Nausea, Vomiting. Endocrine Denies complaints or symptoms of Heat/cold intolerance. Integumentary (Skin) Denies complaints or symptoms of Wounds. Musculoskeletal Denies complaints or symptoms of Muscle Pain, Muscle Weakness. Neurologic Denies complaints or symptoms of Numbness/parasthesias. Psychiatric Denies complaints or symptoms of Claustrophobia, Suicidal. Objective Constitutional Sitting or standing Blood Pressure is within target range for patient.. Pulse regular and within target range for patient.Marland Kitchen Respirations regular, non-labored and within target range.. Temperature is normal and within the target range for the patient.Marland Kitchen Appears in no distress. Vitals Time Taken: 9:45 AM, Temperature: 98.3 F, Pulse: 74 bpm, Respiratory Rate: 18 breaths/min, Blood Pressure: 113/59 mmHg. Eyes Conjunctivae clear. No discharge.no icterus. Ears, Nose, Mouth, and Throat Does not look dehydrated. Respiratory work of breathing is normal. Bilateral breath sounds are clear and equal in all lobes with no wheezes, rales  or rhonchi.. Cardiovascular Heart rhythm and rate regular, without murmur or gallop.. Genitourinary (GU) Foley catheter in place with grossly bloody urine no suprapubic tenderness. Psychiatric appears at normal baseline. Assessment Active Problems ICD-10 Irradiation cystitis with hematuria Gross hematuria Personal history of malignant neoplasm of prostate Plan Follow-up Appointments: Other: - We will call you to start hyperbaric treatments once approval received from insurance Hyperbaric Oxygen Therapy: Evaluate for HBO Therapy Indication: - radiation cystitis If appropriate for treatment, begin HBOT per protocol: 2.5 ATA for 90 Minutes with 2 Five (5) Minute Air Breaks Total Number of Treatments: - 40 One treatments per day (delivered Monday through Friday unless otherwise specified in  Special Instructions below): Antihistamine 30 minutes prior to HBO Treatment, difficulty clearing ears. Radiology ordered were: X-ray, Chest - hyperbaric Oxygen eval 1. Radiation cystitis with gross hematuria, urinary obstruction with blood clots now with a Foley catheter. He has had a very significant blood loss over the last 6 weeks. He was not transfused last night. He certainly is a candidate for hyperbaric oxygen. Goal of this will be to reduce gross hematuria. This will be done by reducing bladder inflammation and improving bladder vascular supply 2. The patient will need a PA and lateral chest 3. He is going to talk to Dr. Tammi Klippel at urology about possibly ordering a transfusion. I understand this is above standard levels to transfuse these days although his hemoglobin is steadily dropping. 4. Low sodium probably related to excess water intake that he is taking to keep make sure he has an adequate urine flow and he also on a small dose of the hydrochlorothiazide as part of his aunts antihypertensive. Electronic Signature(s) Signed: 09/13/2019 5:17:49 PM By: [name] MD Entered By: [name] on 09/13/2019 11:16:16 -------------------------------------------------------------------------------- HxROS Details Patient Name: Date of Service: [name] 09/13/2019 9:45 AM Medical Record [id number] Patient Account Number: [id number] Date of Birth/Sex: Treating RN: [birth year] (63 y.o. [name] Primary Care Provider: [name] Other Clinician: Referring Provider: Treating Provider/Extender:Vonna Brabson, [name], [name] in Treatment: 0 Information Obtained From Patient Constitutional Symptoms (General Health) Complaints and Symptoms: Negative for: Fatigue; Fever; Chills; Marked Weight Change Eyes Complaints and Symptoms: Negative for: Dry Eyes; Vision Changes; Glasses / Contacts Ear/Nose/Mouth/Throat Complaints and  Symptoms: Negative for: Chronic sinus problems or rhinitis Respiratory Complaints and Symptoms: Negative for: Chronic or frequent coughs; Shortness of Breath Gastrointestinal Complaints and Symptoms: Negative for: Frequent diarrhea; Nausea; Vomiting Endocrine Complaints and Symptoms: Negative for: Heat/cold intolerance Integumentary (Skin) Complaints and Symptoms: Negative for: Wounds Musculoskeletal Complaints and Symptoms: Negative for: Muscle Pain; Muscle Weakness Neurologic Complaints and Symptoms: Negative for: Numbness/parasthesias Psychiatric Complaints and Symptoms: Negative for: Claustrophobia; Suicidal Hematologic/Lymphatic Cardiovascular Medical History: Positive for: Hypertension Genitourinary Medical History: Past Medical History Notes: Hematuria, urinary retention Immunological Oncologic Medical History: Positive for: Received Radiation - 38 rounds in 2017 Past Medical History Notes: Prostate Cancer 2015, prostate removed Immunizations Pneumococcal Vaccine: Received Pneumococcal Vaccination: No Implantable Devices None Family and Social History Unknown History: Yes; Never smoker; Marital Status - Married; Alcohol Use: Never; Drug Use: No History; Caffeine Use: Rarely; Financial Concerns: No; Food, Clothing or Shelter Needs: No; Support System Lacking: No; Transportation Concerns: No Engineer, maintenance) Signed: 09/13/2019 5:17:49 PM By: [name] MD Signed: 09/19/2019 5:59:11 PM By: Levan Hurst RN, BSN Entered By: Levan Hurst on 09/13/2019 10:01:06 -------------------------------------------------------------------------------- SuperBill Details Patient Name: Date of Service: [name] 09/13/2019 Medical Record [phone] Patient Account Number: [id number] Date of Birth/Sex: Treating RN: 03-24-1956 (  63 y.o. [name]) Carlene Coria Primary Care Provider: [name] Other Clinician: Referring Provider: Treating  Provider/Extender:Kirin Pastorino, [name], [name] in Treatment: 0 Diagnosis Coding ICD-10 Codes Code Description N30.41 Irradiation cystitis with hematuria R31.0 Gross hematuria Z85.46 Personal history of malignant neoplasm of prostate Facility Procedures CPT4 Code: [id number] Description: [id number] - WOUND CARE VISIT-LEV 3 EST PT Modifier: Quantity: 1 Physician Procedures CPT4 Code: [id number] Description: WC PHYS LEVEL 3 NEW PT ICD-10 Diagnosis Description N30.41 Irradiation cystitis with hematuria R31.0 Gross hematuria Z85.46 Personal history of malignant neoplasm of prost Modifier: ate Quantity: 1 Electronic Signature(s) Signed: 09/13/2019 5:17:49 PM By: [name] MD Signed: 10/11/2019 2:53:59 PM By: Carlene Coria RN Entered By: Carlene Coria on 09/13/2019 13:31:18

## 2019-10-11 NOTE — Progress Notes (Signed)
[name], [name] ([id number]) Visit Report for 10/10/2019 SuperBill Details Patient Name: Date of Service: [name] [name], [name] [name] 10/10/2019 Medical Record [id number] Patient Account Number: [id number] Date of Birth/Sex: Treating RN: Dec 18, 1955 (63 y.o. M) Primary Care Provider: [name] Other Clinician: Mikeal Hawthorne Referring Provider: Treating Provider/Extender:Tristan Proto, [name], [name] in Treatment: 3 Diagnosis Coding ICD-10 Codes Code Description N30.41 Irradiation cystitis with hematuria R31.0 Gross hematuria Z85.46 Personal history of malignant neoplasm of prostate Facility Procedures CPT4 Code Description Modifier Quantity [id number]-(Facility Use Only) HBOT, full body chamber, 20min 4 Physician Procedures CPT4 Code Description Modifier Quantity [id number] - WC PHYS HYPERBARIC OXYGEN THERAPY 1 ICD-10 Diagnosis Description N30.41 Irradiation cystitis with hematuria Electronic Signature(s) Signed: 10/11/2019 10:01:17 AM By: [name] MD Signed: 10/11/2019 4:34:42 PM By: Mikeal Hawthorne EMT/HBOT Entered By: Mikeal Hawthorne on 10/10/2019 15:37:44

## 2019-10-13 ENCOUNTER — Other Ambulatory Visit: Payer: Self-pay

## 2019-10-13 ENCOUNTER — Encounter (HOSPITAL_BASED_OUTPATIENT_CLINIC_OR_DEPARTMENT_OTHER): Payer: Federal, State, Local not specified - PPO | Admitting: Internal Medicine

## 2019-10-13 DIAGNOSIS — N3041 Irradiation cystitis with hematuria: Secondary | ICD-10-CM | POA: Diagnosis not present

## 2019-10-13 NOTE — Progress Notes (Signed)
[name] [name], [name] [name] ([id number]) Visit Report for 10/13/2019 SuperBill Details Patient Name: Date of Service: [name] [name], [name] [name] 10/13/2019 Medical Record [id number] Patient Account Number: [id number] Date of Birth/Sex: Treating RN: [birth year] (63 y.o. M) Primary Care Provider: [name] Other Clinician: Mikeal Hawthorne Referring Provider: Treating Provider/Extender:Kvon Mcilhenny, [name], [name] in Treatment: 4 Diagnosis Coding ICD-10 Codes Code Description N30.41 Irradiation cystitis with hematuria R31.0 Gross hematuria Z85.46 Personal history of malignant neoplasm of prostate Facility Procedures CPT4 Code Description Modifier Quantity [id number]-(Facility Use Only) HBOT, full body chamber, 5min 4 Physician Procedures CPT4 Code Description Modifier Quantity [id number] - WC PHYS HYPERBARIC OXYGEN THERAPY 1 ICD-10 Diagnosis Description N30.41 Irradiation cystitis with hematuria Electronic Signature(s) Signed: 10/13/2019 3:39:48 PM By: Mikeal Hawthorne EMT/HBOT Signed: 10/13/2019 4:44:35 PM By: [name] MD Entered By: Mikeal Hawthorne on 10/13/2019 15:37:15

## 2019-10-13 NOTE — Progress Notes (Addendum)
[name] [name], [name] [name] ([id number]) Visit Report for 10/13/2019 HBO Details Patient Name: Date of Service: [name] [name], [name] [name] 10/13/2019 1:00 PM Medical Record [id number] Patient Account Number: [id number] Date of Birth/Sex: Treating RN: 1956-09-12 (64 y.o. M) Primary Care Anuja Manka: [name] Other Clinician: Mikeal Hawthorne Referring Lamoine Fredricksen: Treating Tashe Purdon/Extender:[name], [name], [name] in Treatment: 4 HBO Treatment Course Details Treatment Course Number: 1 Ordering Alexsys Eskin: [name] Total Treatments Ordered: 40 HBO Treatment Start Date: 09/19/2019 HBO Indication: Late Effect of Radiation HBO Treatment Details Treatment Number: 16 Patient Type: Outpatient Chamber Type: Monoplace Chamber Serial #: [id number] Treatment Protocol: 2.5 ATA with 90 minutes oxygen, with two 5 minute air breaks Treatment Details Compression Rate Down: 2.0 psi / minute De-Compression Rate Up: 2.0 psi / minute Air breaks and CompressTx Pressure breathing periods DecompressDecompress Begins Reached (leave unused spaces Begins Ends blank) Chamber Pressure (ATA)1 2.5 2.5 2.5 2.5 2.5 --2.5 1 Clock Time (24 hr) 12:56 13:08 [id number] 15:00 Treatment Length: 124 (minutes) Treatment Segments: 4 Vital Signs Capillary Blood Glucose Reference Range: 80 - 120 mg / dl HBO Diabetic Blood Glucose Intervention Range: <131 mg/dl or >249 mg/dl Time Vitals Blood Respiratory Capillary Blood Glucose Pulse Action Type: Pulse: Temperature: Taken: Pressure: Rate: Glucose (mg/dl): Meter #: Oximetry (%) Taken: Pre 12:55 169/88 78 16 97.8 Post 15:05 154/96 65 14 98 Treatment Response Treatment Toleration: Well Treatment Completion Treatment Completed without Adverse Event Status: Marks Scalera Notes No concerns with treatment given Physician HBO Attestation: I certify that I supervised this HBO treatment in accordance with Medicare guidelines. A  trained Yes Yes emergency response team is readily available per hospital policies and procedures. Continue HBOT as ordered. Yes Electronic Signature(s) Signed: 10/13/2019 4:44:35 PM By: [name] MD Previous Signature: 10/13/2019 3:39:48 PM Version By: Mikeal Hawthorne EMT/HBOT Entered By: [name] on 10/13/2019 16:15:08 -------------------------------------------------------------------------------- HBO Safety Checklist Details Patient Name: Date of Service: [name] [name] 10/13/2019 1:00 PM Medical Record [id number] Patient Account Number: [id number] Date of Birth/Sex: Treating RN: 1956/06/16 (63 y.o. M) Primary Care Ziyon Soltau: [name] Other Clinician: Mikeal Hawthorne Referring Noel Henandez: Treating Almando Brawley/Extender:[name], [name], [name] in Treatment: 4 HBO Safety Checklist Items Safety Checklist Consent Form Signed Patient voided / foley secured and emptied When did you last eato n/a Last dose of injectable or oral agent n/a NA Ostomy pouch emptied and vented if applicable NA All implantable devices assessed, documented and approved NA Intravenous access site secured and place Valuables secured Linens and cotton and cotton/polyester blend (less than 51% polyester) Personal oil-based products / skin lotions / body lotions removed NA Wigs or hairpieces removed NA Smoking or tobacco materials removed Books / newspapers / magazines / loose paper removed Cologne, aftershave, perfume and deodorant removed Jewelry removed (may wrap wedding band) NA Make-up removed Hair care products removed NA Battery operated devices (external) removed NA Heating patches and chemical warmers removed NA Titanium eyewear removed NA Nail polish cured greater than 10 hours NA Casting material cured greater than 10 hours NA Hearing aids removed NA Loose dentures or partials removed NA Prosthetics have been removed Patient demonstrates correct use  of air break device (if applicable) Patient concerns have been addressed Patient grounding bracelet on and cord attached to chamber Specifics for Inpatients (complete in addition to above) Medication sheet sent with patient Intravenous medications needed or due during therapy sent with patient Drainage tubes (e.g. nasogastric tube or chest tube secured and vented) Endotracheal or Tracheotomy tube secured Cuff deflated of air and  inflated with saline Airway suctioned Electronic Signature(s) Signed: 10/13/2019 1:14:49 PM By: Mikeal Hawthorne EMT/HBOT Entered By: Mikeal Hawthorne on 10/13/2019 13:14:49

## 2019-10-13 NOTE — Progress Notes (Signed)
[name] [name], [name] [name] ([id number]) Visit Report for 10/13/2019 Arrival Information Details Patient Name: Date of Service: [name] [name] 10/13/2019 1:00 PM Medical Record [id number] Patient Account Number: [id number] Date of Birth/Sex: Treating RN: 13-Nov-1955 (63 y.o. M) Primary Care Antanette Richwine: [name] Other Clinician: Mikeal Hawthorne Referring Ronnell Clinger: Treating Ester Mabe/Extender:[name], [name], [name] in Treatment: 4 Visit Information History Since Last Visit Added or deleted any medications: No Patient Arrived: Ambulatory Any new allergies or adverse reactions: No Arrival Time: 12:50 Had a fall or experienced change in No Accompanied By: self activities of daily living that may affect Transfer Assistance: None risk of falls: Patient Identification Verified: Yes Signs or symptoms of abuse/neglect since last No Secondary Verification Process Yes visito Completed: Hospitalized since last visit: No Patient Requires Transmission-Based No Implantable device outside of the clinic excluding No Precautions: cellular tissue based products placed in the center Patient Has Alerts: No since last visit: Pain Present Now: No Electronic Signature(s) Signed: 10/13/2019 3:39:48 PM By: Mikeal Hawthorne EMT/HBOT Entered By: Mikeal Hawthorne on 10/13/2019 13:13:46 -------------------------------------------------------------------------------- Encounter Discharge Information Details Patient Name: Date of Service: [name] [name] 10/13/2019 1:00 PM Medical Record [id number] Patient Account Number: [id number] Date of Birth/Sex: Treating RN: [birth year] (63 y.o. M) Primary Care Mashanda Ishibashi: [name] Other Clinician: Mikeal Hawthorne Referring Huntleigh Doolen: Treating Kherington Meraz/Extender:[name], [name], [name] in Treatment: 4 Encounter Discharge Information Items Discharge Condition: Stable Ambulatory Status: Ambulatory Discharge  Destination: Home Transportation: Private Auto Accompanied By: self Schedule Follow-up Appointment: Yes Clinical Summary of Care: Patient Declined Electronic Signature(s) Signed: 10/13/2019 3:39:48 PM By: Mikeal Hawthorne EMT/HBOT Entered By: Mikeal Hawthorne on 10/13/2019 15:37:43 -------------------------------------------------------------------------------- Patient/Caregiver Education Details Patient Name: [name] [name] 12/10/2020andnbsp1:00 Date of Service: PM Medical Record [id number] Number: Patient Account Number: [id number] Treating RN: [birth year] (63 y.o. Date of Birth/Gender: M) Other Clinician: Mikeal Hawthorne Primary Care Physician: [name] Treating [name] Referring Physician: Physician/Extender: [name] in Treatment: 4 Education Assessment Education Provided To: Patient Education Topics Provided Hyperbaric Oxygenation: Methods: Explain/Verbal Responses: State content correctly Electronic Signature(s) Signed: 10/13/2019 3:39:48 PM By: Mikeal Hawthorne EMT/HBOT Entered By: Mikeal Hawthorne on 10/13/2019 15:37:29 -------------------------------------------------------------------------------- Vitals Details Patient Name: Date of Service: [name] [name] 10/13/2019 1:00 PM Medical Record [id number] Patient Account Number: [id number] Date of Birth/Sex: Treating RN: 1956/06/27 (63 y.o. M) Primary Care Latise Dilley: [name] Other Clinician: Mikeal Hawthorne Referring Daqwan Dougal: Treating Linken Mcglothen/Extender:[name], [name], [name] in Treatment: 4 Vital Signs Time Taken: 12:55 Temperature (F): 97.8 Pulse (bpm): 78 Respiratory Rate (breaths/min): 16 Blood Pressure (mmHg): 169/88 Reference Range: 80 - 120 mg / dl Electronic Signature(s) Signed: 10/13/2019 3:39:48 PM By: Mikeal Hawthorne EMT/HBOT Entered By: Mikeal Hawthorne on 10/13/2019 13:14:03

## 2019-10-14 ENCOUNTER — Encounter (HOSPITAL_BASED_OUTPATIENT_CLINIC_OR_DEPARTMENT_OTHER): Payer: Federal, State, Local not specified - PPO | Admitting: Internal Medicine

## 2019-10-14 DIAGNOSIS — N3041 Irradiation cystitis with hematuria: Secondary | ICD-10-CM | POA: Diagnosis not present

## 2019-10-14 NOTE — Progress Notes (Signed)
[name] [name], [name] [name] ([id number]) Visit Report for 10/14/2019 Arrival Information Details Patient Name: Date of Service: [name] [name], [name] [name] 10/14/2019 1:00 PM Medical Record [id number] Patient Account Number: [id number] Date of Birth/Sex: Treating RN: Dec 02, 1955 (63 y.o. M) Primary Care Jaquail Mclees: [name] Other Clinician: Mikeal Hawthorne Referring Shaunte Weissinger: Treating Saramarie Stinger/Extender:[name], [name], [name] in Treatment: 4 Visit Information History Since Last Visit Added or deleted any medications: No Patient Arrived: Ambulatory Any new allergies or adverse reactions: No Arrival Time: 12:55 Had a fall or experienced change in No Accompanied By: self activities of daily living that may affect Transfer Assistance: None risk of falls: Patient Identification Verified: Yes Signs or symptoms of abuse/neglect since last No Secondary Verification Process Yes visito Completed: Hospitalized since last visit: No Patient Requires Transmission-Based No Implantable device outside of the clinic excluding No Precautions: cellular tissue based products placed in the center Patient Has Alerts: No since last visit: Pain Present Now: No Electronic Signature(s) Signed: 10/14/2019 3:59:32 PM By: Mikeal Hawthorne EMT/HBOT Entered By: Mikeal Hawthorne on 10/14/2019 13:15:33 -------------------------------------------------------------------------------- Encounter Discharge Information Details Patient Name: Date of Service: [name] [name] 10/14/2019 1:00 PM Medical Record [id number] Patient Account Number: [id number] Date of Birth/Sex: Treating RN: [birth year] (63 y.o. M) Primary Care Laiah Pouncey: [name] Other Clinician: Mikeal Hawthorne Referring Shakoya Gilmore: Treating Charmeka Freeburg/Extender:[name], [name], [name] in Treatment: 4 Encounter Discharge Information Items Discharge Condition: Stable Ambulatory Status: Ambulatory Discharge  Destination: Home Transportation: Private Auto Accompanied By: self Schedule Follow-up Appointment: Yes Clinical Summary of Care: Patient Declined Electronic Signature(s) Signed: 10/14/2019 3:59:32 PM By: Mikeal Hawthorne EMT/HBOT Entered By: Mikeal Hawthorne on 10/14/2019 15:57:15 -------------------------------------------------------------------------------- Patient/Caregiver Education Details Patient Name: [name] [name] 12/11/2020andnbsp1:00 Date of Service: PM Medical Record [id number] Number: Patient Account Number: [id number] Treating RN: 05/29/1956 (63 y.o. Date of Birth/Gender: M) Other Clinician: Mikeal Hawthorne Primary Care Physician: [name] Treating [name] Referring Physician: Physician/Extender: [name] in Treatment: 4 Education Assessment Education Provided To: Patient Education Topics Provided Hyperbaric Oxygenation: Methods: Explain/Verbal Responses: State content correctly Electronic Signature(s) Signed: 10/14/2019 3:59:32 PM By: Mikeal Hawthorne EMT/HBOT Entered By: Mikeal Hawthorne on 10/14/2019 15:57:02 -------------------------------------------------------------------------------- Vitals Details Patient Name: Date of Service: [name] [name] 10/14/2019 1:00 PM Medical Record [id number] Patient Account Number: [id number] Date of Birth/Sex: Treating RN: Nov 20, 1955 (63 y.o. M) Primary Care Rhianon Zabawa: [name] Other Clinician: Mikeal Hawthorne Referring Dalani Mette: Treating Bertine Schlottman/Extender:[name], [name], [name] in Treatment: 4 Vital Signs Time Taken: 13:00 Temperature (F): 97.3 Pulse (bpm): 75 Respiratory Rate (breaths/min): 18 Blood Pressure (mmHg): 176/87 Reference Range: 80 - 120 mg / dl Electronic Signature(s) Signed: 10/14/2019 3:59:32 PM By: Mikeal Hawthorne EMT/HBOT Entered By: Mikeal Hawthorne on 10/14/2019 13:17:52

## 2019-10-14 NOTE — Progress Notes (Signed)
[name] [name], [name] [name] ([id number]) Visit Report for 10/14/2019 SuperBill Details Patient Name: Date of Service: [name] [name], [name] [name] 10/14/2019 Medical Record [id number] Patient Account Number: [id number] Date of Birth/Sex: Treating RN: November 11, 1955 (63 y.o. 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

## 2019-10-14 NOTE — Progress Notes (Addendum)
[name], [name] ([id number]) Visit Report for 10/14/2019 HBO Details Patient Name: Date of Service: [name] [name], [name] [name] 10/14/2019 1:00 PM Medical Record [id number] Patient Account Number: [id number] Date of Birth/Sex: Treating RN: [birth year] (63 y.o. M) Primary Care Jaamal Farooqui: [name] Other Clinician: Mikeal Hawthorne Referring Shanequa Whitenight: Treating Carline Dura/Extender:[name], [name], [name] in Treatment: 4 HBO Treatment Course Details Treatment Course Number: 1 Ordering Senta Kantor: [name] Total Treatments Ordered: 40 HBO Treatment Start Date: 09/19/2019 HBO Indication: Late Effect of Radiation HBO Treatment Details Treatment Number: 17 Patient Type: Outpatient Chamber Type: Monoplace Chamber Serial #: [id number] Treatment Protocol: 2.5 ATA with 90 minutes oxygen, with two 5 minute air breaks Treatment Details Compression Rate Down: 2.0 psi / minute De-Compression Rate Up: 2.0 psi / minute Air breaks and CompressTx Pressure breathing periods DecompressDecompress Begins Reached (leave unused spaces Begins Ends blank) Chamber Pressure (ATA)1 2.5 2.5 2.5 2.5 2.5 --2.5 1 Clock Time (24 hr) 13:15 13:27 [id number] 15:19 Treatment Length: 124 (minutes) Treatment Segments: 4 Vital Signs Capillary Blood Glucose Reference Range: 80 - 120 mg / dl HBO Diabetic Blood Glucose Intervention Range: <131 mg/dl or >249 mg/dl Time Vitals Blood Respiratory Capillary Blood Glucose Pulse Action Type: Pulse: Temperature: Taken: Pressure: Rate: Glucose (mg/dl): Meter #: Oximetry (%) Taken: Pre 13:00 176/87 75 18 97.3 Post 15:21 171/96 65 16 97.6 Treatment Response Treatment Toleration: Well Treatment Completion Treatment Completed without Adverse Event Status: Evelyn Aguinaldo Notes No concerns with treatment given Physician HBO Attestation: I certify that I supervised this HBO treatment in accordance with Medicare guidelines. A  trained Yes Yes emergency response team is readily available per hospital policies and procedures. Continue HBOT as ordered. Yes Electronic Signature(s) Signed: 10/14/2019 6:34:01 PM By: [name] MD Previous Signature: 10/14/2019 3:59:32 PM Version By: Mikeal Hawthorne EMT/HBOT Entered By: [name] on 10/14/2019 18:32:15 -------------------------------------------------------------------------------- HBO Safety Checklist Details Patient Name: Date of Service: [name] [name]. 10/14/2019 1:00 PM Medical Record [id number] Patient Account Number: [id number] Date of Birth/Sex: Treating RN: [birth year] (63 y.o. M) Primary Care Vanessia Bokhari: [name] Other Clinician: Mikeal Hawthorne Referring Ermina Oberman: Treating Carlye Panameno/Extender:[name], [name], [name] in Treatment: 4 HBO Safety Checklist Items Safety Checklist Consent Form Signed Patient voided / foley secured and emptied When did you last eato n/a Last dose of injectable or oral agent n/a NA Ostomy pouch emptied and vented if applicable NA All implantable devices assessed, documented and approved NA Intravenous access site secured and place Valuables secured Linens and cotton and cotton/polyester blend (less than 51% polyester) Personal oil-based products / skin lotions / body lotions removed NA Wigs or hairpieces removed NA Smoking or tobacco materials removed Books / newspapers / magazines / loose paper removed Cologne, aftershave, perfume and deodorant removed Jewelry removed (may wrap wedding band) NA Make-up removed Hair care products removed NA Battery operated devices (external) removed NA Heating patches and chemical warmers removed NA Titanium eyewear removed NA Nail polish cured greater than 10 hours NA Casting material cured greater than 10 hours NA Hearing aids removed NA Loose dentures or partials removed NA Prosthetics have been removed Patient demonstrates correct use  of air break device (if applicable) Patient concerns have been addressed Patient grounding bracelet on and cord attached to chamber Specifics for Inpatients (complete in addition to above) Medication sheet sent with patient Intravenous medications needed or due during therapy sent with patient Drainage tubes (e.g. nasogastric tube or chest tube secured and vented) Endotracheal or Tracheotomy tube secured Cuff deflated of air and  inflated with saline Airway suctioned Electronic Signature(s) Signed: 10/14/2019 1:18:34 PM By: Mikeal Hawthorne EMT/HBOT Entered By: Mikeal Hawthorne on 10/14/2019 13:18:33

## 2019-10-17 ENCOUNTER — Other Ambulatory Visit: Payer: Self-pay

## 2019-10-17 ENCOUNTER — Encounter (HOSPITAL_BASED_OUTPATIENT_CLINIC_OR_DEPARTMENT_OTHER): Payer: Federal, State, Local not specified - PPO | Admitting: Internal Medicine

## 2019-10-17 DIAGNOSIS — N3041 Irradiation cystitis with hematuria: Secondary | ICD-10-CM | POA: Diagnosis not present

## 2019-10-17 NOTE — Progress Notes (Signed)
[name] [name], RETTERER ([id number]) Visit Report for 10/17/2019 Arrival Information Details Patient Name: Date of Service: [name] [name], CUNA 10/17/2019 1:00 PM Medical Record [id number] Patient Account Number: [id number] Date of Birth/Sex: Treating RN: 1955/12/04 (63 y.o. M) Primary Care Saarah Dewing: [name] Other Clinician: Mikeal Hawthorne Referring Tawnia Schirm: Treating Janin Kozlowski/Extender:[name], [name], [name] in Treatment: 4 Visit Information History Since Last Visit Added or deleted any medications: No Patient Arrived: Ambulatory Any new allergies or adverse reactions: No Arrival Time: 12:50 Had a fall or experienced change in No Accompanied By: self activities of daily living that may affect Transfer Assistance: None risk of falls: Patient Identification Verified: Yes Signs or symptoms of abuse/neglect since last No Secondary Verification Process Yes visito Completed: Hospitalized since last visit: No Patient Requires Transmission-Based No Implantable device outside of the clinic excluding No Precautions: cellular tissue based products placed in the center Patient Has Alerts: No since last visit: Pain Present Now: No Electronic Signature(s) Signed: 10/17/2019 3:44:56 PM By: Mikeal Hawthorne EMT/HBOT Entered By: Mikeal Hawthorne on 10/17/2019 13:25:34 -------------------------------------------------------------------------------- Encounter Discharge Information Details Patient Name: Date of Service: [name] [name] 10/17/2019 1:00 PM Medical Record [id number] Patient Account Number: [id number] Date of Birth/Sex: Treating RN: 02-08-1956 (63 y.o. M) Primary Care Maghan Jessee: [name] Other Clinician: Mikeal Hawthorne Referring Jonathandavid Marlett: Treating Gwendoline Judy/Extender:[name], [name], [name] in Treatment: 4 Encounter Discharge Information Items Discharge Condition: Stable Ambulatory Status: Ambulatory Discharge  Destination: Home Transportation: Private Auto Accompanied By: self Schedule Follow-up Appointment: Yes Clinical Summary of Care: Patient Declined Electronic Signature(s) Signed: 10/17/2019 3:44:56 PM By: Mikeal Hawthorne EMT/HBOT Entered By: Mikeal Hawthorne on 10/17/2019 15:42:57 -------------------------------------------------------------------------------- Patient/Caregiver Education Details Patient Name: [name] [name] 12/14/2020andnbsp1:00 Date of Service: PM Medical Record [id number] Number: Patient Account Number: [id number] Treating RN: 1956/03/24 (63 y.o. Date of Birth/Gender: M) Other Clinician: Mikeal Hawthorne Primary Care Physician: [name] Treating [name] Referring Physician: Physician/Extender: [name] in Treatment: 4 Education Assessment Education Provided To: Patient Education Topics Provided Hyperbaric Oxygenation: Methods: Explain/Verbal Responses: State content correctly Electronic Signature(s) Signed: 10/17/2019 3:44:56 PM By: Mikeal Hawthorne EMT/HBOT Entered By: Mikeal Hawthorne on 10/17/2019 15:42:46 -------------------------------------------------------------------------------- Vitals Details Patient Name: Date of Service: [name] [name] 10/17/2019 1:00 PM Medical Record [id number] Patient Account Number: [id number] Date of Birth/Sex: Treating RN: Jan 27, 1956 (63 y.o. M) Primary Care Rahmel Nedved: [name] Other Clinician: Mikeal Hawthorne Referring Isamu Trammel: Treating Ralynn San/Extender:[name], [name], [name] in Treatment: 4 Vital Signs Time Taken: 12:55 Temperature (F): 98.3 Pulse (bpm): 92 Respiratory Rate (breaths/min): 17 Blood Pressure (mmHg): 165/87 Reference Range: 80 - 120 mg / dl Electronic Signature(s) Signed: 10/17/2019 3:44:56 PM By: Mikeal Hawthorne EMT/HBOT Entered By: Mikeal Hawthorne on 10/17/2019 13:25:58

## 2019-10-17 NOTE — Progress Notes (Addendum)
[name] [name] ([id number]) Visit Report for 10/17/2019 HBO Details Patient Name: Date of Service: [name] [name], [name] [name] 10/17/2019 1:00 PM Medical Record [id number] Patient Account Number: [id number] Date of Birth/Sex: Treating RN: January 21, 1956 (63 y.o. M) Primary Care Candid Bovey: [name] Other Clinician: Mikeal Hawthorne Referring Shuntell Foody: Treating Areg Bialas/Extender:[name], [name], [name] in Treatment: 4 HBO Treatment Course Details Treatment Course Number: 1 Ordering Delisa Finck: [name] Total Treatments Ordered: 40 HBO Treatment Start Date: 09/19/2019 HBO Indication: Late Effect of Radiation HBO Treatment Details Treatment Number: 18 Patient Type: Outpatient Chamber Type: Monoplace Chamber Serial #: [id number] Treatment Protocol: 2.5 ATA with 90 minutes oxygen, with two 5 minute air breaks Treatment Details Compression Rate Down: 2.0 psi / minute De-Compression Rate Up: 2.0 psi / minute Air breaks and CompressTx Pressure breathing periods DecompressDecompress Begins Reached (leave unused spaces Begins Ends blank) Chamber Pressure (ATA)1 2.5 2.5 2.5 2.5 2.5 --2.5 1 Clock Time (24 hr) 12:57 13:09 [id number] 15:01 Treatment Length: 124 (minutes) Treatment Segments: 4 Vital Signs Capillary Blood Glucose Reference Range: 80 - 120 mg / dl HBO Diabetic Blood Glucose Intervention Range: <131 mg/dl or >249 mg/dl Time Vitals Blood Respiratory Capillary Blood Glucose Pulse Action Type: Pulse: Temperature: Taken: Pressure: Rate: Glucose (mg/dl): Meter #: Oximetry (%) Taken: Pre 12:55 165/87 92 17 98.3 Post 15:04 182/96 69 15 98.1 Treatment Response Treatment Toleration: Well Treatment Completion Treatment Completed without Adverse Event Status: Quintella Mura Notes No concerns with treatment given Physician HBO Attestation: I certify that I supervised this HBO treatment in accordance with Medicare guidelines. A  trained Yes Yes emergency response team is readily available per hospital policies and procedures. Continue HBOT as ordered. Yes Electronic Signature(s) Signed: 10/17/2019 5:56:50 PM By: [name] MD Previous Signature: 10/17/2019 3:44:56 PM Version By: Mikeal Hawthorne EMT/HBOT Entered By: [name] on 10/17/2019 17:54:47 -------------------------------------------------------------------------------- HBO Safety Checklist Details Patient Name: Date of Service: [name] [name]. 10/17/2019 1:00 PM Medical Record [id number] Patient Account Number: [id number] Date of Birth/Sex: Treating RN: 04-30-1956 (63 y.o. M) Primary Care Garrus Gauthreaux: [name] Other Clinician: Mikeal Hawthorne Referring Liora Myles: Treating Joseguadalupe Stan/Extender:[name], [name], [name] in Treatment: 4 HBO Safety Checklist Items Safety Checklist Consent Form Signed Patient voided / foley secured and emptied When did you last eato n/a Last dose of injectable or oral agent n/a NA Ostomy pouch emptied and vented if applicable NA All implantable devices assessed, documented and approved NA Intravenous access site secured and place Valuables secured Linens and cotton and cotton/polyester blend (less than 51% polyester) Personal oil-based products / skin lotions / body lotions removed NA Wigs or hairpieces removed NA Smoking or tobacco materials removed Books / newspapers / magazines / loose paper removed Cologne, aftershave, perfume and deodorant removed Jewelry removed (may wrap wedding band) NA Make-up removed Hair care products removed NA Battery operated devices (external) removed NA Heating patches and chemical warmers removed NA Titanium eyewear removed NA Nail polish cured greater than 10 hours NA Casting material cured greater than 10 hours NA Hearing aids removed NA Loose dentures or partials removed NA Prosthetics have been removed Patient demonstrates correct use  of air break device (if applicable) Patient concerns have been addressed Patient grounding bracelet on and cord attached to chamber Specifics for Inpatients (complete in addition to above) Medication sheet sent with patient Intravenous medications needed or due during therapy sent with patient Drainage tubes (e.g. nasogastric tube or chest tube secured and vented) Endotracheal or Tracheotomy tube secured Cuff deflated of air and  inflated with saline Airway suctioned Electronic Signature(s) Signed: 10/17/2019 1:26:34 PM By: Mikeal Hawthorne EMT/HBOT Entered By: Mikeal Hawthorne on 10/17/2019 13:26:33

## 2019-10-17 NOTE — Progress Notes (Signed)
[name] [name], [name] [name] ([id number]) Visit Report for 10/17/2019 SuperBill Details Patient Name: Date of Service: [name] [name], [name] [name] 10/17/2019 Medical Record [id number] Patient Account Number: [id number] Date of Birth/Sex: Treating RN: 01-19-1956 (63 y.o. 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

## 2019-10-18 ENCOUNTER — Encounter (HOSPITAL_BASED_OUTPATIENT_CLINIC_OR_DEPARTMENT_OTHER): Payer: Federal, State, Local not specified - PPO | Admitting: Internal Medicine

## 2019-10-18 DIAGNOSIS — N3041 Irradiation cystitis with hematuria: Secondary | ICD-10-CM | POA: Diagnosis not present

## 2019-10-18 NOTE — Progress Notes (Addendum)
[name] [name], [name] [name] ([id number]) Visit Report for 10/18/2019 HBO Details Patient Name: Date of Service: [name] [name], [name] [name] 10/18/2019 1:00 PM Medical Record [id number] Patient Account Number: [id number] Date of Birth/Sex: Treating RN: 1956/07/19 (63 y.o. Male) Primary Care Jocee Kissick: [name] Other Clinician: Mikeal Hawthorne Referring Krishauna Schatzman: Treating Mehdi Gironda/Extender:[name], [name], [name] in Treatment: 5 HBO Treatment Course Details Treatment Course Number: 1 Ordering Kezia Benevides: [name] Total Treatments Ordered: 40 HBO Treatment Start Date: 09/19/2019 HBO Indication: Late Effect of Radiation HBO Treatment Details Treatment Number: 19 Patient Type: Outpatient Chamber Type: Monoplace Chamber Serial #: [id number] Treatment Protocol: 2.5 ATA with 90 minutes oxygen, with two 5 minute air breaks Treatment Details Compression Rate Down: 2.0 psi / minute De-Compression Rate Up: 2.0 psi / minute Air breaks and CompressTx Pressure breathing periods DecompressDecompress Begins Reached (leave unused spaces Begins Ends blank) Chamber Pressure (ATA)1 2.5 2.5 2.5 2.5 2.5 --2.5 1 Clock Time (24 hr) 13:01 13:13 [id number] 15:05 Treatment Length: 124 (minutes) Treatment Segments: 4 Vital Signs Capillary Blood Glucose Reference Range: 80 - 120 mg / dl HBO Diabetic Blood Glucose Intervention Range: <131 mg/dl or >249 mg/dl Time Vitals Blood Respiratory Capillary Blood Glucose Pulse Action Type: Pulse: Temperature: Taken: Pressure: Rate: Glucose (mg/dl): Meter #: Oximetry (%) Taken: Pre 13:00 172/82 81 14 98.1 Post 15:07 152/96 70 15 97.8 Treatment Response Treatment Toleration: Well Treatment Completion Treatment Completed without Adverse Event Status: Kili Gracy Notes No concerns with treatment given Physician HBO Attestation: I certify that I supervised this HBO treatment in accordance with Medicare guidelines. A  trained Yes Yes emergency response team is readily available per hospital policies and procedures. Continue HBOT as ordered. Yes Electronic Signature(s) Signed: 10/18/2019 5:53:46 PM By: [name] MD Previous Signature: 10/18/2019 3:57:04 PM Version By: Mikeal Hawthorne EMT/HBOT Entered By: [name] on 10/18/2019 17:36:00 -------------------------------------------------------------------------------- HBO Safety Checklist Details Patient Name: Date of Service: [name] [name]. 10/18/2019 1:00 PM Medical Record [id number] Patient Account Number: [id number] Date of Birth/Sex: Treating RN: 11-26-1955 (63 y.o. Male) Primary Care Yecheskel Kurek: [name] Other Clinician: Mikeal Hawthorne Referring Shellia Hartl: Treating Savahanna Almendariz/Extender:[name], [name], [name] in Treatment: 5 HBO Safety Checklist Items Safety Checklist Consent Form Signed Patient voided / foley secured and emptied When did you last eato n/a Last dose of injectable or oral agent n/a NA Ostomy pouch emptied and vented if applicable NA All implantable devices assessed, documented and approved NA Intravenous access site secured and place Valuables secured Linens and cotton and cotton/polyester blend (less than 51% polyester) Personal oil-based products / skin lotions / body lotions removed NA Wigs or hairpieces removed NA Smoking or tobacco materials removed Books / newspapers / magazines / loose paper removed Cologne, aftershave, perfume and deodorant removed Jewelry removed (may wrap wedding band) NA Make-up removed Hair care products removed NA Battery operated devices (external) removed NA Heating patches and chemical warmers removed NA Titanium eyewear removed NA Nail polish cured greater than 10 hours NA Casting material cured greater than 10 hours NA Hearing aids removed NA Loose dentures or partials removed NA Prosthetics have been removed Patient demonstrates correct  use of air break device (if applicable) Patient concerns have been addressed Patient grounding bracelet on and cord attached to chamber Specifics for Inpatients (complete in addition to above) Medication sheet sent with patient Intravenous medications needed or due during therapy sent with patient Drainage tubes (e.g. nasogastric tube or chest tube secured and vented) Endotracheal or Tracheotomy tube secured Cuff deflated of air and  inflated with saline Airway suctioned Electronic Signature(s) Signed: 10/18/2019 1:48:12 PM By: Mikeal Hawthorne EMT/HBOT Entered By: Mikeal Hawthorne on 10/18/2019 13:48:11

## 2019-10-18 NOTE — Progress Notes (Signed)
[name] [name], [name] [name] ([id number]) Visit Report for 10/18/2019 SuperBill Details Patient Name: Date of Service: [name] [name], [name] [name] 10/18/2019 Medical Record [id number] Patient Account Number: [id number] Date of Birth/Sex: Treating RN: 12-Apr-1956 (63 y.o. Male) Primary Care Provider: [name] Other Clinician: Mikeal Hawthorne Referring Provider: Treating Provider/Extender:Arael Piccione, [name], [name] in Treatment: 5 Diagnosis Coding ICD-10 Codes Code Description N30.41 Irradiation cystitis with hematuria R31.0 Gross hematuria Z85.46 Personal history of malignant neoplasm of prostate Facility Procedures CPT4 Code Description Modifier Quantity [id number] G0277-(Facility Use Only) HBOT, full body chamber, 76min 4 Physician Procedures CPT4 Code Description Modifier Quantity [id number] - WC PHYS HYPERBARIC OXYGEN THERAPY 1 ICD-10 Diagnosis Description N30.41 Irradiation cystitis with hematuria Electronic Signature(s) Signed: 10/18/2019 3:57:04 PM By: Mikeal Hawthorne EMT/HBOT Signed: 10/18/2019 5:53:46 PM By: [name] MD Entered By: Mikeal Hawthorne on 10/18/2019 15:54:51

## 2019-10-18 NOTE — Progress Notes (Signed)
CHIPPER, GAILLARD ([id number]) Visit Report for 10/18/2019 Arrival Information Details Patient Name: Date of Service: [name] [name], [name] [name] 10/18/2019 1:00 PM Medical Record [id number] Patient Account Number: [id number] Date of Birth/Sex: Treating RN: Aug 29, 1956 63 y.o. Male) Primary Care Zamzam Whinery: [name] Other Clinician: Mikeal Hawthorne Referring Maira Christon: Treating Nuchem Grattan/Extender:[name], [name], [name] in Treatment: 5 Visit Information History Since Last Visit Added or deleted any medications: No Patient Arrived: Ambulatory Any new allergies or adverse reactions: No Arrival Time: 12:55 Had a fall or experienced change in No Accompanied By: self activities of daily living that may affect Transfer Assistance: None risk of falls: Patient Identification Verified: Yes Signs or symptoms of abuse/neglect since last No Secondary Verification Process Yes visito Completed: Hospitalized since last visit: No Patient Requires Transmission-Based No Implantable device outside of the clinic excluding No Precautions: cellular tissue based products placed in the center Patient Has Alerts: No since last visit: Pain Present Now: No Electronic Signature(s) Signed: 10/18/2019 3:57:04 PM By: Mikeal Hawthorne EMT/HBOT Entered By: Mikeal Hawthorne on 10/18/2019 13:47:12 -------------------------------------------------------------------------------- Encounter Discharge Information Details Patient Name: Date of Service: [name] [name] 10/18/2019 1:00 PM Medical Record [id number] Patient Account Number: [id number] Date of Birth/Sex: Treating RN: 07/06/1956 (63 y.o. Male) Primary Care Tavari Loadholt: [name] Other Clinician: Mikeal Hawthorne Referring Mattia Osterman: Treating Elven Laboy/Extender:[name], [name], [name] in Treatment: 5 Encounter Discharge Information Items Discharge Condition: Stable Ambulatory Status: Ambulatory Discharge  Destination: Home Transportation: Private Auto Accompanied By: self Schedule Follow-up Appointment: Yes Clinical Summary of Care: Patient Declined Electronic Signature(s) Signed: 10/18/2019 3:57:04 PM By: Mikeal Hawthorne EMT/HBOT Entered By: Mikeal Hawthorne on 10/18/2019 15:55:18 -------------------------------------------------------------------------------- Patient/Caregiver Education Details Patient Name: [name] [name] 12/15/2020andnbsp1:00 Date of Service: PM Medical Record [id number] Number: Patient Account Number: [id number] Treating RN: 02-25-1956 (63 y.o. Date of Birth/Gender: Male) Other Clinician: Mikeal Hawthorne Primary Care Treating Sheran Luz Physician: Physician/Extender: Referring Physician: [name] in Treatment: 5 Education Assessment Education Provided To: Patient Education Topics Provided Hyperbaric Oxygenation: Methods: Explain/Verbal Responses: State content correctly Electronic Signature(s) Signed: 10/18/2019 3:57:04 PM By: Mikeal Hawthorne EMT/HBOT Entered By: Mikeal Hawthorne on 10/18/2019 15:55:05 -------------------------------------------------------------------------------- Vitals Details Patient Name: Date of Service: [name] [name] 10/18/2019 1:00 PM Medical Record [id number] Patient Account Number: [id number] Date of Birth/Sex: Treating RN: 1956-01-13 (63 y.o. Male) Primary Care Chrishon Martino: [name] Other Clinician: Mikeal Hawthorne Referring Danniell Rotundo: Treating Khairi Garman/Extender:[name], [name], [name] in Treatment: 5 Vital Signs Time Taken: 13:00 Temperature (F): 98.1 Pulse (bpm): 81 Respiratory Rate (breaths/min): 14 Blood Pressure (mmHg): 172/82 Reference Range: 80 - 120 mg / dl Electronic Signature(s) Signed: 10/18/2019 3:57:04 PM By: Mikeal Hawthorne EMT/HBOT Entered By: Mikeal Hawthorne on 10/18/2019 13:47:31

## 2019-10-19 ENCOUNTER — Encounter (HOSPITAL_BASED_OUTPATIENT_CLINIC_OR_DEPARTMENT_OTHER): Payer: Federal, State, Local not specified - PPO | Admitting: Physician Assistant

## 2019-10-19 ENCOUNTER — Other Ambulatory Visit: Payer: Self-pay

## 2019-10-19 DIAGNOSIS — N3041 Irradiation cystitis with hematuria: Secondary | ICD-10-CM | POA: Diagnosis not present

## 2019-10-19 NOTE — Progress Notes (Signed)
VASILIY, CREAR ([id number]) Visit Report for 10/19/2019 Problem List Details Patient Name: Date of Service: [name] [name], [name] [name] 10/19/2019 1:00 PM Medical Record [id number] Patient Account Number: [id number] Date of Birth/Sex: Treating RN: [birth year] (63 y.o. 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

## 2019-10-19 NOTE — Progress Notes (Signed)
[name] [name], [name] [name] ([id number]) Visit Report for 10/19/2019 Arrival Information Details Patient Name: Date of Service: [name] [name], [name] [name] 10/19/2019 1:00 PM Medical Record [id number] Patient Account Number: [id number] Date of Birth/Sex: Treating RN: 04-Feb-1956 (63 y.o. M) Primary Care Elysse Polidore: [name] Other Clinician: Mikeal Hawthorne Referring Janziel Hockett: Treating Quaran Kedzierski/Extender:Stone III, Joannie Springs, [name] in Treatment: 5 Visit Information History Since Last Visit Added or deleted any medications: No Patient Arrived: Ambulatory Any new allergies or adverse reactions: No Arrival Time: 12:50 Had a fall or experienced change in No Accompanied By: self activities of daily living that may affect Transfer Assistance: None risk of falls: Patient Identification Verified: Yes Signs or symptoms of abuse/neglect since last No Secondary Verification Process Yes visito Completed: Hospitalized since last visit: No Patient Requires Transmission-Based No Implantable device outside of the clinic excluding No Precautions: cellular tissue based products placed in the center Patient Has Alerts: No since last visit: Pain Present Now: No Electronic Signature(s) Signed: 10/19/2019 3:47:19 PM By: Mikeal Hawthorne EMT/HBOT Entered By: Mikeal Hawthorne on 10/19/2019 13:55:26 -------------------------------------------------------------------------------- Encounter Discharge Information Details Patient Name: Date of Service: [name] [name] 10/19/2019 1:00 PM Medical Record [id number] Patient Account Number: [id number] Date of Birth/Sex: Treating RN: 01-04-1956 (63 y.o. M) Primary Care Eveleigh Crumpler: [name] Other Clinician: Mikeal Hawthorne Referring Alioune Hodgkin: Treating Sayan Aldava/Extender:Stone III, Joannie [place], [name] in Treatment: 5 Encounter Discharge Information Items Discharge Condition: Stable Ambulatory Status: Ambulatory Discharge  Destination: Home Transportation: Private Auto Accompanied By: self Schedule Follow-up Appointment: Yes Clinical Summary of Care: Patient Declined Electronic Signature(s) Signed: 10/19/2019 3:47:19 PM By: Mikeal Hawthorne EMT/HBOT Entered By: Mikeal Hawthorne on 10/19/2019 15:45:15 -------------------------------------------------------------------------------- Patient/Caregiver Education Details Patient Name: [name] [name] 12/16/2020andnbsp1:00 Date of Service: PM Medical Record [id number] Number: Patient Account Number: [id number] Treating RN: September 14, 1956 (63 y.o. Date of Birth/Gender: M) Other Clinician: Mikeal Hawthorne Primary Care Physician: [name] Treating [name] Referring Physician: Physician/Extender: [name] in Treatment: 5 Education Assessment Education Provided To: Patient Education Topics Provided Hyperbaric Oxygenation: Methods: Explain/Verbal Responses: State content correctly Electronic Signature(s) Signed: 10/19/2019 3:47:19 PM By: Mikeal Hawthorne EMT/HBOT Entered By: Mikeal Hawthorne on 10/19/2019 15:45:01 -------------------------------------------------------------------------------- Vitals Details Patient Name: Date of Service: [name] [name] 10/19/2019 1:00 PM Medical Record [id number] Patient Account Number: [id number] Date of Birth/Sex: Treating RN: Sep 25, 1956 (63 y.o. M) Primary Care Maddux First: [name] Other Clinician: Mikeal Hawthorne Referring Webber Michiels: Treating Kismet Facemire/Extender:Stone III, Joannie Springs, [name] in Treatment: 5 Vital Signs Time Taken: 12:55 Temperature (F): 98 Pulse (bpm): 71 Respiratory Rate (breaths/min): 15 Blood Pressure (mmHg): 175/96 Reference Range: 80 - 120 mg / dl Electronic Signature(s) Signed: 10/19/2019 3:47:19 PM By: Mikeal Hawthorne EMT/HBOT Entered By: Mikeal Hawthorne on 10/19/2019 13:55:54

## 2019-10-19 NOTE — Progress Notes (Addendum)
[name] [name], [name] [name] ([id number]) Visit Report for 10/19/2019 HBO Details Patient Name: Date of Service: [name] [name], [name] [name] 10/19/2019 1:00 PM Medical Record [id number] Patient Account Number: [id number] Date of Birth/Sex: Treating RN: March 02, 1956 (63 y.o. M) Primary Care Kensy Blizard: [name] Other Clinician: Mikeal Hawthorne Referring Lewanna Petrak: Treating Reinhardt Licausi/Extender:Stone III, Joannie Springs, [name] in Treatment: 5 HBO Treatment Course Details Treatment Course Number: 1 Ordering Manav Pierotti: [name] Total Treatments Ordered: 40 HBO Treatment Start Date: 09/19/2019 HBO Indication: Late Effect of Radiation HBO Treatment Details Treatment Number: 20 Patient Type: Outpatient Chamber Type: Monoplace Chamber Serial #: [id number] Treatment Protocol: 2.5 ATA with 90 minutes oxygen, with two 5 minute air breaks Treatment Details Compression Rate Down: 2.0 psi / minute De-Compression Rate Up: 2.0 psi / minute Air breaks and CompressTx Pressure breathing periods DecompressDecompress Begins Reached (leave unused spaces Begins Ends blank) Chamber Pressure (ATA)1 2.5 2.5 2.5 2.5 2.5 --2.5 1 Clock Time (24 hr) 13:02 13:14 [id number] 15:06 Treatment Length: 124 (minutes) Treatment Segments: 4 Vital Signs Capillary Blood Glucose Reference Range: 80 - 120 mg / dl HBO Diabetic Blood Glucose Intervention Range: <131 mg/dl or >249 mg/dl Time Vitals Blood Respiratory Capillary Blood Glucose Pulse Action Type: Pulse: Temperature: Taken: Pressure: Rate: Glucose (mg/dl): Meter #: Oximetry (%) Taken: Pre 12:55 175/96 71 15 98 Post 15:10 157/75 66 14 98.3 Treatment Response Treatment Toleration: Well Treatment Completion Treatment Completed without Adverse Event Status: Physician HBO Attestation: I certify that I supervised this HBO treatment in accordance with Medicare guidelines. A trained Yes emergency response team is readily available  per hospital policies and procedures. Continue HBOT as ordered. Yes Electronic Signature(s) Signed: 10/19/2019 5:36:20 PM By: [name] PA-C Previous Signature: 10/19/2019 3:47:19 PM Version By: Mikeal Hawthorne EMT/HBOT Entered By: [name] on 10/19/2019 17:36:20 -------------------------------------------------------------------------------- HBO Safety Checklist Details Patient Name: Date of Service: [name] [name], [name] [name] 10/19/2019 1:00 PM Medical Record [id number] Patient Account Number: [id number] Date of Birth/Sex: Treating RN: 1956/09/24 (63 y.o. M) Primary Care Pailynn Vahey: [name] Other Clinician: Mikeal Hawthorne Referring Rudolf Blizard: Treating Davida Falconi/Extender:Stone III, Joannie Springs, [name] in Treatment: 5 HBO Safety Checklist Items Safety Checklist Consent Form Signed Patient voided / foley secured and emptied When did you last eato n/a Last dose of injectable or oral agent n/a NA Ostomy pouch emptied and vented if applicable NA All implantable devices assessed, documented and approved NA Intravenous access site secured and place Valuables secured Linens and cotton and cotton/polyester blend (less than 51% polyester) Personal oil-based products / skin lotions / body lotions removed NA Wigs or hairpieces removed NA Smoking or tobacco materials removed Books / newspapers / magazines / loose paper removed Cologne, aftershave, perfume and deodorant removed Jewelry removed (may wrap wedding band) NA Make-up removed Hair care products removed NA Battery operated devices (external) removed NA Heating patches and chemical warmers removed NA Titanium eyewear removed NA Nail polish cured greater than 10 hours NA Casting material cured greater than 10 hours NA Hearing aids removed NA Loose dentures or partials removed NA Prosthetics have been removed Patient demonstrates correct use of air break device (if applicable) Patient concerns have been  addressed Patient grounding bracelet on and cord attached to chamber Specifics for Inpatients (complete in addition to above) Medication sheet sent with patient Intravenous medications needed or due during therapy sent with patient Drainage tubes (e.g. nasogastric tube or chest tube secured and vented) Endotracheal or Tracheotomy tube secured Cuff deflated of air and inflated with saline Airway  suctioned Electronic Signature(s) Signed: 10/19/2019 1:56:31 PM By: Mikeal Hawthorne EMT/HBOT Entered By: Mikeal Hawthorne on 10/19/2019 13:56:30

## 2019-10-20 ENCOUNTER — Encounter (HOSPITAL_BASED_OUTPATIENT_CLINIC_OR_DEPARTMENT_OTHER): Payer: Federal, State, Local not specified - PPO | Admitting: Internal Medicine

## 2019-10-20 DIAGNOSIS — N3041 Irradiation cystitis with hematuria: Secondary | ICD-10-CM | POA: Diagnosis not present

## 2019-10-20 NOTE — Progress Notes (Signed)
GRANDON, TORSIELLO ([id number]) Visit Report for 10/20/2019 SuperBill Details Patient Name: Date of Service: JKAI, ELFMAN 10/20/2019 Medical Record [id number] Patient Account Number: [id number] Date of Birth/Sex: Treating RN: [birth year] (63 y.o. 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

## 2019-10-20 NOTE — Progress Notes (Signed)
RASHEEN, NICKLAUS ([id number]) Visit Report for 10/20/2019 Arrival Information Details Patient Name: Date of Service: MARRIO, KWOLEK 10/20/2019 1:00 PM Medical Record [id number] Patient Account Number: [id number] Date of Birth/Sex: Treating RN: 09/20/1956 (63 y.o. M) Primary Care Hadleigh Felber: [name] Other Clinician: Mikeal Hawthorne Referring Latrisa Hellums: Treating Gissel Keilman/Extender:[name], [name], [name] in Treatment: 5 Visit Information History Since Last Visit Added or deleted any medications: No Patient Arrived: Ambulatory Any new allergies or adverse reactions: No Arrival Time: 12:55 Had a fall or experienced change in No Accompanied By: self activities of daily living that may affect Transfer Assistance: None risk of falls: Patient Identification Verified: Yes Signs or symptoms of abuse/neglect since last No Secondary Verification Process Yes visito Completed: Hospitalized since last visit: No Patient Requires Transmission-Based No Implantable device outside of the clinic excluding No Precautions: cellular tissue based products placed in the center Patient Has Alerts: No since last visit: Pain Present Now: No Electronic Signature(s) Signed: 10/20/2019 3:39:50 PM By: Mikeal Hawthorne EMT/HBOT Entered By: Mikeal Hawthorne on 10/20/2019 13:31:06 -------------------------------------------------------------------------------- Encounter Discharge Information Details Patient Name: Date of Service: [name] 10/20/2019 1:00 PM Medical Record [id number] Patient Account Number: [id number] Date of Birth/Sex: Treating RN: 03/25/1956 (63 y.o. M) Primary Care Shevawn Langenberg: [name] Other Clinician: Mikeal Hawthorne Referring Adriana Lina: Treating Marvalene Barrett/Extender:[name], [name], [name] in Treatment: 5 Encounter Discharge Information Items Discharge Condition: Stable Ambulatory Status: Ambulatory Discharge  Destination: Home Transportation: Private Auto Accompanied By: self Schedule Follow-up Appointment: Yes Clinical Summary of Care: Patient Declined Electronic Signature(s) Signed: 10/20/2019 3:39:50 PM By: Mikeal Hawthorne EMT/HBOT Entered By: Mikeal Hawthorne on 10/20/2019 15:37:46 -------------------------------------------------------------------------------- Patient/Caregiver Education Details Patient Name: [name] 12/17/2020andnbsp1:00 Date of Service: PM Medical Record [id number] Number: Patient Account Number: [id number] Treating RN: 16-Jun-1956 (63 y.o. Date of Birth/Gender: M) Other Clinician: Mikeal Hawthorne Primary Care Physician: [name] Treating [name] Referring Physician: Physician/Extender: [name] in Treatment: 5 Education Assessment Education Provided To: Patient Education Topics Provided Hyperbaric Oxygenation: Methods: Explain/Verbal Responses: State content correctly Electronic Signature(s) Signed: 10/20/2019 3:39:50 PM By: Mikeal Hawthorne EMT/HBOT Entered By: Mikeal Hawthorne on 10/20/2019 15:37:35 -------------------------------------------------------------------------------- Vitals Details Patient Name: Date of Service: [name] 10/20/2019 1:00 PM Medical Record [id number] Patient Account Number: [id number] Date of Birth/Sex: Treating RN: [birth year] (63 y.o. M) Primary Care Konnar Ben: [name] Other Clinician: Mikeal Hawthorne Referring Maygen Sirico: Treating Kaidin Boehle/Extender:[name], [name], [name] in Treatment: 5 Vital Signs Time Taken: 13:00 Temperature (F): 98.4 Pulse (bpm): 86 Respiratory Rate (breaths/min): 15 Blood Pressure (mmHg): 159/75 Reference Range: 80 - 120 mg / dl Electronic Signature(s) Signed: 10/20/2019 3:39:50 PM By: Mikeal Hawthorne EMT/HBOT Entered By: Mikeal Hawthorne on 10/20/2019 13:31:22

## 2019-10-20 NOTE — Progress Notes (Addendum)
[name], [name] ([id number]) Visit Report for 10/20/2019 HBO Details Patient Name: Date of Service: [name] [name], [name] [name] 10/20/2019 1:00 PM Medical Record [id number] Patient Account Number: [id number] Date of Birth/Sex: Treating RN: 03-11-1956 (63 y.o. M) Primary Care Wrangler Penning: [name] Other Clinician: Mikeal Hawthorne Referring Pualani Borah: Treating Thaddaeus Granja/Extender:[name], [name], [name] in Treatment: 5 HBO Treatment Course Details Treatment Course Number: 1 Ordering Domonic Hiscox: [name] Total Treatments Ordered: 40 HBO Treatment Start Date: 09/19/2019 HBO Indication: Late Effect of Radiation HBO Treatment Details Treatment Number: 21 Patient Type: Outpatient Chamber Type: Monoplace Chamber Serial #: [id number] Treatment Protocol: 2.5 ATA with 90 minutes oxygen, with two 5 minute air breaks Treatment Details Compression Rate Down: 2.0 psi / minute De-Compression Rate Up: 2.0 psi / minute Air breaks and CompressTx Pressure breathing periods DecompressDecompress Begins Reached (leave unused spaces Begins Ends blank) Chamber Pressure (ATA)1 2.5 2.5 2.5 2.5 2.5 --2.5 1 Clock Time (24 hr) 12:54 13:06 [id number] 14:58 Treatment Length: 124 (minutes) Treatment Segments: 4 Vital Signs Capillary Blood Glucose Reference Range: 80 - 120 mg / dl HBO Diabetic Blood Glucose Intervention Range: <131 mg/dl or >249 mg/dl Time Vitals Blood Respiratory Capillary Blood Glucose Pulse Action Type: Pulse: Temperature: Taken: Pressure: Rate: Glucose (mg/dl): Meter #: Oximetry (%) Taken: Pre 13:00 159/75 86 15 98.4 Post 15:00 142/75 65 14 98.6 Treatment Response Treatment Toleration: Well Treatment Completion Treatment Completed without Adverse Event Status: [name] [name] Notes No concerns with treatment given Physician HBO Attestation: I certify that I supervised this HBO treatment in accordance with Medicare guidelines. A  trained Yes Yes emergency response team is readily available per hospital policies and procedures. Continue HBOT as ordered. Yes Electronic Signature(s) Signed: 10/20/2019 5:01:00 PM By: [name] MD Previous Signature: 10/20/2019 3:39:50 PM Version By: Mikeal Hawthorne EMT/HBOT Entered By: [name] on 10/20/2019 16:58:43 -------------------------------------------------------------------------------- HBO Safety Checklist Details Patient Name: Date of Service: [name] [name] 10/20/2019 1:00 PM Medical Record [id number] Patient Account Number: [id number] Date of Birth/Sex: Treating RN: 24-Dec-1955 (63 y.o. M) Primary Care Brayam Boeke: [name] Other Clinician: Mikeal Hawthorne Referring Kimberlee Shoun: Treating Sonu Kruckenberg/Extender:[name], [name], [name] in Treatment: 5 HBO Safety Checklist Items Safety Checklist Consent Form Signed Patient voided / foley secured and emptied When did you last eato n/a Last dose of injectable or oral agent n/a NA Ostomy pouch emptied and vented if applicable NA All implantable devices assessed, documented and approved NA Intravenous access site secured and place Valuables secured Linens and cotton and cotton/polyester blend (less than 51% polyester) Personal oil-based products / skin lotions / body lotions removed NA Wigs or hairpieces removed NA Smoking or tobacco materials removed Books / newspapers / magazines / loose paper removed Cologne, aftershave, perfume and deodorant removed Jewelry removed (may wrap wedding band) NA Make-up removed Hair care products removed NA Battery operated devices (external) removed NA Heating patches and chemical warmers removed NA Titanium eyewear removed NA Nail polish cured greater than 10 hours NA Casting material cured greater than 10 hours NA Hearing aids removed NA Loose dentures or partials removed NA Prosthetics have been removed Patient demonstrates correct use  of air break device (if applicable) Patient concerns have been addressed Patient grounding bracelet on and cord attached to chamber Specifics for Inpatients (complete in addition to above) Medication sheet sent with patient Intravenous medications needed or due during therapy sent with patient Drainage tubes (e.g. nasogastric tube or chest tube secured and vented) Endotracheal or Tracheotomy tube secured Cuff deflated of air and  inflated with saline Airway suctioned Electronic Signature(s) Signed: 10/20/2019 1:31:56 PM By: Mikeal Hawthorne EMT/HBOT Entered By: Mikeal Hawthorne on 10/20/2019 13:31:56

## 2019-10-21 ENCOUNTER — Encounter (HOSPITAL_BASED_OUTPATIENT_CLINIC_OR_DEPARTMENT_OTHER): Payer: Federal, State, Local not specified - PPO | Admitting: Internal Medicine

## 2019-10-21 ENCOUNTER — Other Ambulatory Visit: Payer: Self-pay

## 2019-10-21 DIAGNOSIS — N3041 Irradiation cystitis with hematuria: Secondary | ICD-10-CM | POA: Diagnosis not present

## 2019-10-21 NOTE — Progress Notes (Signed)
ISTVAN, WACHTEL ([id number]) Visit Report for 10/21/2019 SuperBill Details Patient Name: Date of Service: KOHL, LAUDERMAN 10/21/2019 Medical Record [id number] Patient Account Number: [id number] Date of Birth/Sex: Treating RN: 1955/12/15 (63 y.o. 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

## 2019-10-21 NOTE — Progress Notes (Signed)
ORR, VISCOMI ([id number]) Visit Report for 10/21/2019 Arrival Information Details Patient Name: Date of Service: [name] [name], [name] [name] 10/21/2019 1:00 PM Medical Record [id number] Patient Account Number: [id number] Date of Birth/Sex: Treating RN: 12/19/1955 (63 y.o. M) Primary Care Norleen Xie: [name] Other Clinician: Mikeal Hawthorne Referring Sophiarose Eades: Treating Harinder Romas/Extender:[name], [name], [name] in Treatment: 5 Visit Information History Since Last Visit Added or deleted any medications: No Patient Arrived: Ambulatory Any new allergies or adverse reactions: No Arrival Time: 12:50 Had a fall or experienced change in No Accompanied By: self activities of daily living that may affect Transfer Assistance: None risk of falls: Patient Identification Verified: Yes Signs or symptoms of abuse/neglect since last No Secondary Verification Process Yes visito Completed: Hospitalized since last visit: No Patient Requires Transmission-Based No Implantable device outside of the clinic excluding No Precautions: cellular tissue based products placed in the center Patient Has Alerts: No since last visit: Pain Present Now: No Electronic Signature(s) Signed: 10/21/2019 5:07:56 PM By: Mikeal Hawthorne EMT/HBOT Entered By: Mikeal Hawthorne on 10/21/2019 13:38:25 -------------------------------------------------------------------------------- Encounter Discharge Information Details Patient Name: Date of Service: [name] [name] 10/21/2019 1:00 PM Medical Record [id number] Patient Account Number: [id number] Date of Birth/Sex: Treating RN: 03-02-1956 (63 y.o. M) Primary Care Massiel Stipp: [name] Other Clinician: Mikeal Hawthorne Referring Jovaun Levene: Treating Rama Mcclintock/Extender:[name], [name], [name] in Treatment: 5 Encounter Discharge Information Items Discharge Condition: Stable Ambulatory Status: Ambulatory Discharge  Destination: Home Transportation: Private Auto Accompanied By: self Schedule Follow-up Appointment: Yes Clinical Summary of Care: Patient Declined Electronic Signature(s) Signed: 10/21/2019 5:07:56 PM By: Mikeal Hawthorne EMT/HBOT Entered By: Mikeal Hawthorne on 10/21/2019 17:06:06 -------------------------------------------------------------------------------- Patient/Caregiver Education Details Patient Name: [name] [name] 12/18/2020andnbsp1:00 Date of Service: PM Medical Record [id number] Number: Patient Account Number: [id number] Treating RN: [birth year] (63 y.o. Date of Birth/Gender: M) Other Clinician: Mikeal Hawthorne Primary Care Physician: [name] Treating [name] Referring Physician: Physician/Extender: [name] in Treatment: 5 Education Assessment Education Provided To: Patient Education Topics Provided Hyperbaric Oxygenation: Methods: Explain/Verbal Responses: State content correctly Electronic Signature(s) Signed: 10/21/2019 5:07:56 PM By: Mikeal Hawthorne EMT/HBOT Entered By: Mikeal Hawthorne on 10/21/2019 17:05:57 -------------------------------------------------------------------------------- Vitals Details Patient Name: Date of Service: [name] [name] 10/21/2019 1:00 PM Medical Record [id number] Patient Account Number: [id number] Date of Birth/Sex: Treating RN: Dec 22, 1955 (63 y.o. M) Primary Care Blanche Gallien: [name] Other Clinician: Referring Leandria Thier: Treating Merrilyn Legler/Extender:[name], [name], [name] in Treatment: 5 Vital Signs Time Taken: 12:55 Temperature (F): 97.8 Pulse (bpm): 91 Respiratory Rate (breaths/min): 18 Blood Pressure (mmHg): 169/81 Reference Range: 80 - 120 mg / dl Electronic Signature(s) Signed: 10/21/2019 5:07:56 PM By: Mikeal Hawthorne EMT/HBOT Entered By: Mikeal Hawthorne on 10/21/2019 13:38:40

## 2019-10-21 NOTE — Progress Notes (Addendum)
DEMARCO, LODUCA ([id number]) Visit Report for 10/21/2019 HBO Details Patient Name: Date of Service: [name] [name], [name] [name] 10/21/2019 1:00 PM Medical Record [id number] Patient Account Number: [id number] Date of Birth/Sex: Treating RN: 12-Sep-1956 (63 y.o. M) Primary Care Ridley Schewe: [name] Other Clinician: Mikeal Hawthorne Referring Calen Geister: Treating Braylin Xu/Extender:[name], [name], [name] in Treatment: 5 HBO Treatment Course Details Treatment Course Number: 1 Ordering Lakota Markgraf: [name] Total Treatments Ordered: 40 HBO Treatment Start Date: 09/19/2019 HBO Indication: Late Effect of Radiation HBO Treatment Details Treatment Number: 22 Patient Type: Outpatient Chamber Type: Monoplace Chamber Serial #: [id number] Treatment Protocol: 2.5 ATA with 90 minutes oxygen, with two 5 minute air breaks Treatment Details Compression Rate Down: 2.0 psi / minute De-Compression Rate Up: 2.0 psi / minute Air breaks and CompressTx Pressure breathing periods DecompressDecompress Begins Reached (leave unused spaces Begins Ends blank) Chamber Pressure (ATA)1 2.5 2.5 2.5 2.5 2.5 --2.5 1 Clock Time (24 hr) 13:03 13:15 13:4513:5014:2014:25--14:55 15:07 Treatment Length: 124 (minutes) Treatment Segments: 4 Vital Signs Capillary Blood Glucose Reference Range: 80 - 120 mg / dl HBO Diabetic Blood Glucose Intervention Range: <131 mg/dl or >249 mg/dl Time Vitals Blood Respiratory Capillary Blood Glucose Pulse Action Type: Pulse: Temperature: Taken: Pressure: Rate: Glucose (mg/dl): Meter #: Oximetry (%) Taken: Pre 12:55 169/81 91 18 97.8 Post 15:10 138/77 69 16 98 Treatment Response Treatment Toleration: Well Treatment Completion Treatment Completed without Adverse Event Status: Karmela Bram Notes No concerns with treatment given Physician HBO Attestation: I certify that I supervised this HBO treatment in accordance with Medicare guidelines. A  trained Yes Yes emergency response team is readily available per hospital policies and procedures. Continue HBOT as ordered. Yes Electronic Signature(s) Signed: 10/21/2019 6:01:02 PM By: [name] MD Previous Signature: 10/21/2019 5:07:56 PM Version By: Mikeal Hawthorne EMT/HBOT Entered By: [name] on 10/21/2019 17:14:28 -------------------------------------------------------------------------------- HBO Safety Checklist Details Patient Name: Date of Service: [name] 10/21/2019 1:00 PM Medical Record [id number] Patient Account Number: [id number] Date of Birth/Sex: Treating RN: 1956/04/22 (63 y.o. M) Primary Care Dura Mccormack: [name] Other Clinician: Mikeal Hawthorne Referring Hutton Pellicane: Treating Mae Cianci/Extender:[name], [name], [name] in Treatment: 5 HBO Safety Checklist Items Safety Checklist Consent Form Signed Patient voided / foley secured and emptied When did you last eato n/a Last dose of injectable or oral agent n/a NA Ostomy pouch emptied and vented if applicable NA All implantable devices assessed, documented and approved NA Intravenous access site secured and place Valuables secured Linens and cotton and cotton/polyester blend (less than 51% polyester) Personal oil-based products / skin lotions / body lotions removed NA Wigs or hairpieces removed NA Smoking or tobacco materials removed Books / newspapers / magazines / loose paper removed Cologne, aftershave, perfume and deodorant removed Jewelry removed (may wrap wedding band) NA Make-up removed Hair care products removed NA Battery operated devices (external) removed NA Heating patches and chemical warmers removed NA Titanium eyewear removed NA Nail polish cured greater than 10 hours NA Casting material cured greater than 10 hours NA Hearing aids removed NA Loose dentures or partials removed NA Prosthetics have been removed Patient demonstrates correct use  of air break device (if applicable) Patient concerns have been addressed Patient grounding bracelet on and cord attached to chamber Specifics for Inpatients (complete in addition to above) Medication sheet sent with patient Intravenous medications needed or due during therapy sent with patient Drainage tubes (e.g. nasogastric tube or chest tube secured and vented) Endotracheal or Tracheotomy tube secured Cuff deflated of air and  inflated with saline Airway suctioned Electronic Signature(s) Signed: 10/21/2019 1:39:16 PM By: Mikeal Hawthorne EMT/HBOT Entered By: Mikeal Hawthorne on 10/21/2019 13:39:16

## 2019-10-24 ENCOUNTER — Encounter (HOSPITAL_BASED_OUTPATIENT_CLINIC_OR_DEPARTMENT_OTHER): Payer: Federal, State, Local not specified - PPO | Admitting: Internal Medicine

## 2019-10-24 ENCOUNTER — Other Ambulatory Visit: Payer: Self-pay

## 2019-10-24 DIAGNOSIS — N3041 Irradiation cystitis with hematuria: Secondary | ICD-10-CM | POA: Diagnosis not present

## 2019-10-24 NOTE — Progress Notes (Signed)
[name], [name] ([id number]) Visit Report for 10/24/2019 Arrival Information Details Patient Name: Date of Service: [name] [name], [name] [name] 10/24/2019 1:00 PM Medical Record [id number] Patient Account Number: [id number] Date of Birth/Sex: Treating RN: [birth year] (63 y.o. M) Primary Care Benjie Ricketson: [name] Other Clinician: Mikeal Hawthorne Referring Ashwini Jago: Treating Janasia Coverdale/Extender:[name], [name], [name] in Treatment: 5 Visit Information History Since Last Visit Added or deleted any medications: No Patient Arrived: Ambulatory Any new allergies or adverse reactions: No Arrival Time: 13:40 Had a fall or experienced change in No Accompanied By: self activities of daily living that may affect Transfer Assistance: None risk of falls: Patient Identification Verified: Yes Signs or symptoms of abuse/neglect since last No Secondary Verification Process Yes visito Completed: Hospitalized since last visit: No Patient Requires Transmission-Based No Implantable device outside of the clinic excluding No Precautions: cellular tissue based products placed in the center Patient Has Alerts: No since last visit: Pain Present Now: No Electronic Signature(s) Signed: 10/24/2019 6:06:17 PM By: Mikeal Hawthorne EMT/HBOT Entered By: Mikeal Hawthorne on 10/24/2019 14:07:17 -------------------------------------------------------------------------------- Encounter Discharge Information Details Patient Name: Date of Service: [name] [name] 10/24/2019 1:00 PM Medical Record [id number] Patient Account Number: [id number] Date of Birth/Sex: Treating RN: [birth year] (63 y.o. M) Primary Care Arleatha Philipps: [name] Other Clinician: Mikeal Hawthorne Referring Tonimarie Gritz: Treating Kyzer Blowe/Extender:[name], [name], [name] in Treatment: 5 Encounter Discharge Information Items Discharge Condition: Stable Ambulatory Status: Ambulatory Discharge  Destination: Home Transportation: Private Auto Accompanied By: self Schedule Follow-up Appointment: Yes Clinical Summary of Care: Patient Declined Electronic Signature(s) Signed: 10/24/2019 6:06:17 PM By: Mikeal Hawthorne EMT/HBOT Entered By: Mikeal Hawthorne on 10/24/2019 16:38:19 -------------------------------------------------------------------------------- Patient/Caregiver Education Details Patient Name: [name] [name] 12/21/2020andnbsp1:00 Date of Service: PM Medical Record [id number] Number: Patient Account Number: [id number] Treating RN: 11-Oct-1956 (63 y.o. Date of Birth/Gender: M) Other Clinician: Mikeal Hawthorne Primary Care Physician: [name] Treating [name] Referring Physician: Physician/Extender: [name] in Treatment: 5 Education Assessment Education Provided To: Patient Education Topics Provided Hyperbaric Oxygenation: Methods: Explain/Verbal Responses: State content correctly Electronic Signature(s) Signed: 10/24/2019 6:06:17 PM By: Mikeal Hawthorne EMT/HBOT Entered By: Mikeal Hawthorne on 10/24/2019 16:38:04 -------------------------------------------------------------------------------- Vitals Details Patient Name: Date of Service: [name] [name] 10/24/2019 1:00 PM Medical Record [id number] Patient Account Number: [id number] Date of Birth/Sex: Treating RN: 07-05-1956 (63 y.o. M) Primary Care Carleen Rhue: [name] Other Clinician: Mikeal Hawthorne Referring Faye Strohman: Treating Charnice Zwilling/Extender:[name], [name], [name] in Treatment: 5 Vital Signs Time Taken: 13:45 Temperature (F): 98.4 Pulse (bpm): 83 Respiratory Rate (breaths/min): 17 Blood Pressure (mmHg): 167/81 Reference Range: 80 - 120 mg / dl Electronic Signature(s) Signed: 10/24/2019 6:06:17 PM By: Mikeal Hawthorne EMT/HBOT Entered By: Mikeal Hawthorne on 10/24/2019 14:07:33

## 2019-10-24 NOTE — Progress Notes (Addendum)
[name], [name] ([id number]) Visit Report for 10/24/2019 HBO Details Patient Name: Date of Service: [name], [name] 10/24/2019 1:00 PM Medical Record [id number] Patient Account Number: [id number] Date of Birth/Sex: Treating RN: 1956/08/13 (63 y.o. M) Primary Care [name]: [name] Other Clinician: Mikeal Hawthorne Referring Chistine Dematteo: Treating [name]/Extender:[name], [name], [name] in Treatment: 5 HBO Treatment Course Details Treatment Course Number: 1 Ordering Marry Kusch: [name] Total Treatments Ordered: 40 HBO Treatment Start Date: 09/19/2019 HBO Indication: Late Effect of Radiation HBO Treatment Details Treatment Number: 23 Patient Type: Outpatient Chamber Type: Monoplace Chamber Serial #: [id number] Treatment Protocol: 2.5 ATA with 90 minutes oxygen, with two 5 minute air breaks Treatment Details Compression Rate Down: 2.0 psi / minute De-Compression Rate Up: 2.0 psi / minute Air breaks and CompressTx Pressure breathing periods DecompressDecompress Begins Reached (leave unused spaces Begins Ends blank) Chamber Pressure (ATA)1 2.5 2.5 2.5 2.5 2.5 --2.5 1 Clock Time (24 hr) 13:55 14:07 [id number] 15:59 Treatment Length: 124 (minutes) Treatment Segments: 4 Vital Signs Capillary Blood Glucose Reference Range: 80 - 120 mg / dl HBO Diabetic Blood Glucose Intervention Range: <131 mg/dl or >249 mg/dl Time Vitals Blood Respiratory Capillary Blood Glucose Pulse Action Type: Pulse: Temperature: Taken: Pressure: Rate: Glucose (mg/dl): Meter #: Oximetry (%) Taken: Pre 13:45 167/81 83 17 98.4 Post 16:02 142/98 63 14 98 Treatment Response Treatment Toleration: Well Treatment Completion Treatment Completed without Adverse Event Status: Electronic Signature(s) Signed: 10/24/2019 6:06:17 PM By: Mikeal Hawthorne EMT/HBOT Signed: 10/25/2019 7:47:18 AM By: [name] MD Entered By: Mikeal Hawthorne on 10/24/2019  16:37:39 -------------------------------------------------------------------------------- HBO Safety Checklist Details Patient Name: Date of Service: [name] [name] 10/24/2019 1:00 PM Medical Record [id number] Patient Account Number: [id number] Date of Birth/Sex: Treating RN: [birth year] (63 y.o. M) Primary Care Lukisha Procida: [name] Other Clinician: Mikeal Hawthorne Referring Alexes Menchaca: Treating Kealan Buchan/Extender:[name], [name], [name] in Treatment: 5 HBO Safety Checklist Items Safety Checklist Consent Form Signed Patient voided / foley secured and emptied When did you last eato n/a Last dose of injectable or oral agent n/a NA Ostomy pouch emptied and vented if applicable NA All implantable devices assessed, documented and approved NA Intravenous access site secured and place Valuables secured Linens and cotton and cotton/polyester blend (less than 51% polyester) Personal oil-based products / skin lotions / body lotions removed NA Wigs or hairpieces removed NA Smoking or tobacco materials removed Books / newspapers / magazines / loose paper removed Cologne, aftershave, perfume and deodorant removed Jewelry removed (may wrap wedding band) NA Make-up removed Hair care products removed NA Battery operated devices (external) removed NA Heating patches and chemical warmers removed NA Titanium eyewear removed NA Nail polish cured greater than 10 hours NA Casting material cured greater than 10 hours NA Hearing aids removed NA Loose dentures or partials removed NA Prosthetics have been removed Patient demonstrates correct use of air break device (if applicable) Patient concerns have been addressed Patient grounding bracelet on and cord attached to chamber Specifics for Inpatients (complete in addition to above) Medication sheet sent with patient Intravenous medications needed or due during therapy sent with patient Drainage tubes (e.g. nasogastric  tube or chest tube secured and vented) Endotracheal or Tracheotomy tube secured Cuff deflated of air and inflated with saline Airway suctioned Electronic Signature(s) Signed: 10/24/2019 2:08:25 PM By: Mikeal Hawthorne EMT/HBOT Entered By: Mikeal Hawthorne on 10/24/2019 14:08:24

## 2019-10-25 ENCOUNTER — Other Ambulatory Visit: Payer: Self-pay

## 2019-10-25 ENCOUNTER — Encounter (HOSPITAL_BASED_OUTPATIENT_CLINIC_OR_DEPARTMENT_OTHER): Payer: Federal, State, Local not specified - PPO | Admitting: Internal Medicine

## 2019-10-25 DIAGNOSIS — N3041 Irradiation cystitis with hematuria: Secondary | ICD-10-CM | POA: Diagnosis not present

## 2019-10-25 NOTE — Progress Notes (Signed)
KUZEY, BERHANE ([id number]) Visit Report for 10/25/2019 Arrival Information Details Patient Name: Date of Service: DEMPSEY, MCSWEEN 10/25/2019 1:00 PM Medical Record [id number] Patient Account Number: [id number] Date of Birth/Sex: Treating RN: October 08, 1956 (63 y.o. M) Primary Care Liberta Gimpel: [name] Other Clinician: Mikeal Hawthorne Referring Tilton Marsalis: Treating Nasrin Lanzo/Extender:[name], [name], [name] in Treatment: 6 Visit Information History Since Last Visit Added or deleted any medications: No Patient Arrived: Ambulatory Any new allergies or adverse reactions: No Arrival Time: 12:50 Had a fall or experienced change in No Accompanied By: self activities of daily living that may affect Transfer Assistance: None risk of falls: Patient Identification Verified: Yes Signs or symptoms of abuse/neglect since last No Secondary Verification Process Yes visito Completed: Hospitalized since last visit: No Patient Requires Transmission-Based No Implantable device outside of the clinic excluding No Precautions: cellular tissue based products placed in the center Patient Has Alerts: No since last visit: Pain Present Now: No Electronic Signature(s) Signed: 10/25/2019 3:57:28 PM By: Mikeal Hawthorne EMT/HBOT Entered By: Mikeal Hawthorne on 10/25/2019 13:24:22 -------------------------------------------------------------------------------- Encounter Discharge Information Details Patient Name: Date of Service: [name] 10/25/2019 1:00 PM Medical Record [id number] Patient Account Number: [id number] Date of Birth/Sex: Treating RN: [birth year] (63 y.o. M) Primary Care Hjalmer Iovino: [name] Other Clinician: Mikeal Hawthorne Referring Kristy Catoe: Treating Winnell Bento/Extender:[name], [name], [name] in Treatment: 6 Encounter Discharge Information Items Discharge Condition: Stable Ambulatory Status: Ambulatory Discharge  Destination: Home Transportation: Private Auto Accompanied By: self Schedule Follow-up Appointment: Yes Clinical Summary of Care: Patient Declined Electronic Signature(s) Signed: 10/25/2019 3:57:28 PM By: Mikeal Hawthorne EMT/HBOT Entered By: Mikeal Hawthorne on 10/25/2019 15:54:33 -------------------------------------------------------------------------------- Patient/Caregiver Education Details Patient Name: [name] 12/22/2020andnbsp1:00 Date of Service: PM Medical Record [id number] Number: Patient Account Number: [id number] Treating RN: 02-09-1956 (63 y.o. Date of Birth/Gender: M) Other Clinician: Mikeal Hawthorne Primary Care Physician: [name] Treating [name] Referring Physician: Physician/Extender: [name] in Treatment: 6 Education Assessment Education Provided To: Patient Education Topics Provided Hyperbaric Oxygenation: Methods: Explain/Verbal Responses: State content correctly Electronic Signature(s) Signed: 10/25/2019 3:57:28 PM By: Mikeal Hawthorne EMT/HBOT Entered By: Mikeal Hawthorne on 10/25/2019 15:54:21 -------------------------------------------------------------------------------- Vitals Details Patient Name: Date of Service: [name] 10/25/2019 1:00 PM Medical Record [id number] Patient Account Number: [id number] Date of Birth/Sex: Treating RN: [birth year] (63 y.o. M) Primary Care Caris Cerveny: [name] Other Clinician: Mikeal Hawthorne Referring Acasia Skilton: Treating Sukhmani Fetherolf/Extender:[name], [name], [name] in Treatment: 6 Vital Signs Time Taken: 12:55 Temperature (F): 97.7 Pulse (bpm): 72 Respiratory Rate (breaths/min): 18 Blood Pressure (mmHg): 151/75 Reference Range: 80 - 120 mg / dl Electronic Signature(s) Signed: 10/25/2019 3:57:28 PM By: Mikeal Hawthorne EMT/HBOT Entered By: Mikeal Hawthorne on 10/25/2019 13:24:41

## 2019-10-25 NOTE — Progress Notes (Signed)
JEYDEN, KONOPA ([id number]) Visit Report for 10/24/2019 SuperBill Details Patient Name: Date of Service: CELESTER, SCALISI 10/24/2019 Medical Record [id number] Patient Account Number: [id number] Date of Birth/Sex: Treating RN: 1956-01-16 (63 y.o. 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

## 2019-10-25 NOTE — Progress Notes (Signed)
ANNDY, GRENIER ([id number]) Visit Report for 10/25/2019 SuperBill Details Patient Name: Date of Service: DAMASCUS, BLOT 10/25/2019 Medical Record [id number] Patient Account Number: [id number] Date of Birth/Sex: Treating RN: [birth year] (63 y.o. 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

## 2019-10-25 NOTE — Progress Notes (Addendum)
[name], [name] ([id number]) Visit Report for 10/25/2019 HBO Details Patient Name: Date of Service: [name] [name], [name] [name] 10/25/2019 1:00 PM Medical Record [id number] Patient Account Number: [id number] Date of Birth/Sex: Treating RN: Sep 24, 1956 (63 y.o. M) Primary Care Mirha Brucato: [name] Other Clinician: Mikeal Hawthorne Referring Laquinda Moller: Treating Illene Sweeting/Extender:[name], [name], [name] in Treatment: 6 HBO Treatment Course Details Treatment Course Number: 1 Ordering Corwin Kuiken: [name] Total Treatments Ordered: 40 HBO Treatment Start Date: 09/19/2019 HBO Indication: Late Effect of Radiation HBO Treatment Details Treatment Number: 24 Patient Type: Outpatient Chamber Type: Monoplace Chamber Serial #: [id number] Treatment Protocol: 2.5 ATA with 90 minutes oxygen, with two 5 minute air breaks Treatment Details Compression Rate Down: 2.0 psi / minute De-Compression Rate Up: 2.0 psi / minute Air breaks and CompressTx Pressure breathing periods DecompressDecompress Begins Reached (leave unused spaces Begins Ends blank) Chamber Pressure (ATA)1 2.5 2.5 2.5 2.5 2.5 --2.5 1 Clock Time (24 hr) 13:07 13:19 [id number] 15:11 Treatment Length: 124 (minutes) Treatment Segments: 4 Vital Signs Capillary Blood Glucose Reference Range: 80 - 120 mg / dl HBO Diabetic Blood Glucose Intervention Range: <131 mg/dl or >249 mg/dl Time Vitals Blood Respiratory Capillary Blood Glucose Pulse Action Type: Pulse: Temperature: Taken: Pressure: Rate: Glucose (mg/dl): Meter #: Oximetry (%) Taken: Pre 12:55 151/75 72 18 97.7 Post 15:13 152/90 67 16 98 Treatment Response Treatment Toleration: Well Treatment Completion Treatment Completed without Adverse Event Status: Sharona Rovner Notes No concerns with treatment given Physician HBO Attestation: I certify that I supervised this HBO treatment in accordance with Medicare guidelines. A  trained Yes Yes emergency response team is readily available per hospital policies and procedures. Continue HBOT as ordered. Yes Electronic Signature(s) Signed: 10/25/2019 6:32:44 PM By: [name] MD Previous Signature: 10/25/2019 3:57:28 PM Version By: Mikeal Hawthorne EMT/HBOT Entered By: [name] on 10/25/2019 18:30:55 -------------------------------------------------------------------------------- HBO Safety Checklist Details Patient Name: Date of Service: [name] [name] 10/25/2019 1:00 PM Medical Record [id number] Patient Account Number: [id number] Date of Birth/Sex: Treating RN: 1955/11/04 (63 y.o. M) Primary Care Jerrion Tabbert: [name] Other Clinician: Mikeal Hawthorne Referring Yailine Ballard: Treating Laurielle Selmon/Extender:[name], [name], [name] in Treatment: 6 HBO Safety Checklist Items Safety Checklist Consent Form Signed Patient voided / foley secured and emptied When did you last eato n/a Last dose of injectable or oral agent n/a NA Ostomy pouch emptied and vented if applicable NA All implantable devices assessed, documented and approved NA Intravenous access site secured and place Valuables secured Linens and cotton and cotton/polyester blend (less than 51% polyester) Personal oil-based products / skin lotions / body lotions removed NA Wigs or hairpieces removed NA Smoking or tobacco materials removed Books / newspapers / magazines / loose paper removed Cologne, aftershave, perfume and deodorant removed Jewelry removed (may wrap wedding band) NA Make-up removed Hair care products removed NA Battery operated devices (external) removed NA Heating patches and chemical warmers removed NA Titanium eyewear removed NA Nail polish cured greater than 10 hours NA Casting material cured greater than 10 hours NA Hearing aids removed NA Loose dentures or partials removed NA Prosthetics have been removed Patient demonstrates correct use  of air break device (if applicable) Patient concerns have been addressed Patient grounding bracelet on and cord attached to chamber Specifics for Inpatients (complete in addition to above) Medication sheet sent with patient Intravenous medications needed or due during therapy sent with patient Drainage tubes (e.g. nasogastric tube or chest tube secured and vented) Endotracheal or Tracheotomy tube secured Cuff deflated of air and  inflated with saline Airway suctioned Electronic Signature(s) Signed: 10/25/2019 1:26:04 PM By: Mikeal Hawthorne EMT/HBOT Entered By: Mikeal Hawthorne on 10/25/2019 13:26:04

## 2019-10-26 ENCOUNTER — Encounter (HOSPITAL_BASED_OUTPATIENT_CLINIC_OR_DEPARTMENT_OTHER): Payer: Federal, State, Local not specified - PPO | Admitting: Physician Assistant

## 2019-10-26 DIAGNOSIS — N3041 Irradiation cystitis with hematuria: Secondary | ICD-10-CM | POA: Diagnosis not present

## 2019-10-26 NOTE — Progress Notes (Signed)
KONNER, BILSKI ([id number]) Visit Report for 10/26/2019 Problem List Details Patient Name: Date of Service: NATE, BERROCAL 10/26/2019 1:00 PM Medical Record [id number] Patient Account Number: [id number] Date of Birth/Sex: Treating RN: Jun 03, 1956 (63 y.o. 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

## 2019-10-26 NOTE — Progress Notes (Signed)
[name] [name], [name] [name] ([id number]) Visit Report for 10/26/2019 Arrival Information Details Patient Name: Date of Service: [name], [name] 10/26/2019 1:00 PM Medical Record [id number] Patient Account Number: [id number] Date of Birth/Sex: Treating RN: June 04, 1956 (63 y.o. M) Primary Care Dangelo Guzzetta: [name] Other Clinician: Mikeal Hawthorne Referring Alyric Parkin: Treating Ameet Sandy/Extender:Stone III, Joannie Springs, [name] in Treatment: 6 Visit Information History Since Last Visit Added or deleted any medications: No Patient Arrived: Ambulatory Any new allergies or adverse reactions: No Arrival Time: 13:00 Had a fall or experienced change in No Accompanied By: self activities of daily living that may affect Transfer Assistance: None risk of falls: Patient Identification Verified: Yes Signs or symptoms of abuse/neglect since last No Secondary Verification Process Yes visito Completed: Hospitalized since last visit: No Patient Requires Transmission-Based No Implantable device outside of the clinic excluding No Precautions: cellular tissue based products placed in the center Patient Has Alerts: No since last visit: Pain Present Now: No Electronic Signature(s) Signed: 10/26/2019 3:48:24 PM By: Mikeal Hawthorne EMT/HBOT Entered By: Mikeal Hawthorne on 10/26/2019 13:20:28 -------------------------------------------------------------------------------- Encounter Discharge Information Details Patient Name: Date of Service: [name] [name] 10/26/2019 1:00 PM Medical Record [id number] Patient Account Number: [id number] Date of Birth/Sex: Treating RN: 11/24/1955 (63 y.o. M) Primary Care [name] [name]: [name] Other Clinician: Mikeal Hawthorne Referring [name]: Treating [name]/Extender:Stone III, Joannie Springs, [name] in Treatment: 6 Encounter Discharge Information Items Discharge Condition: Stable Ambulatory Status: Ambulatory Discharge  Destination: Home Transportation: Private Auto Accompanied By: self Schedule Follow-up Appointment: Yes Clinical Summary of Care: Patient Declined Electronic Signature(s) Signed: 10/26/2019 3:48:24 PM By: Mikeal Hawthorne EMT/HBOT Entered By: Mikeal Hawthorne on 10/26/2019 15:44:08 -------------------------------------------------------------------------------- Patient/Caregiver Education Details Patient Name: [name] [name] 12/23/2020andnbsp1:00 Date of Service: PM Medical Record [id number] Number: Patient Account Number: [id number] Treating RN: 1956-06-08 (63 y.o. Date of Birth/Gender: M) Other Clinician: Mikeal Hawthorne Primary Care Physician: [name] Treating [name] [name] Referring Physician: Physician/Extender: [name] in Treatment: 6 Education Assessment Education Provided To: Patient Education Topics Provided Hyperbaric Oxygenation: Methods: Explain/Verbal Responses: State content correctly Electronic Signature(s) Signed: 10/26/2019 3:48:24 PM By: Mikeal Hawthorne EMT/HBOT Entered By: Mikeal Hawthorne on 10/26/2019 15:43:55 -------------------------------------------------------------------------------- Vitals Details Patient Name: Date of Service: [name] [name] 10/26/2019 1:00 PM Medical Record [id number] Patient Account Number: [id number] Date of Birth/Sex: Treating RN: August 30, 1956 (63 y.o. M) Primary Care [name] [name]: [name] Other Clinician: Mikeal Hawthorne Referring [name]: Treating [name]/Extender:Stone III, Joannie Springs, [name] in Treatment: 6 Vital Signs Time Taken: 13:05 Temperature (F): 98 Pulse (bpm): 79 Respiratory Rate (breaths/min): 18 Blood Pressure (mmHg): 169/72 Reference Range: 80 - 120 mg / dl Electronic Signature(s) Signed: 10/26/2019 3:48:24 PM By: Mikeal Hawthorne EMT/HBOT Entered By: Mikeal Hawthorne on 10/26/2019 13:20:49

## 2019-10-26 NOTE — Progress Notes (Addendum)
KROY, SAFFLE ([id number]) Visit Report for 10/26/2019 HBO Details Patient Name: Date of Service: [name] [name], [name] [name] 10/26/2019 1:00 PM Medical Record [id number] Patient Account Number: [id number] Date of Birth/Sex: Treating RN: July 23, 1956 (63 y.o. M) Primary Care Evelisse Szalkowski: [name] Other Clinician: Mikeal Hawthorne Referring Itai Barbian: Treating Sharlyne Koeneman/Extender:Stone III, Joannie Springs, [name] in Treatment: 6 HBO Treatment Course Details Treatment Course Number: 1 Ordering Emilene Roma: [name] Total Treatments Ordered: 40 HBO Treatment Start Date: 09/19/2019 HBO Indication: Late Effect of Radiation HBO Treatment Details Treatment Number: 25 Patient Type: Outpatient Chamber Type: Monoplace Chamber Serial #: [id number] Treatment Protocol: 2.5 ATA with 90 minutes oxygen, with two 5 minute air breaks Treatment Details Compression Rate Down: 2.0 psi / minute De-Compression Rate Up: 2.0 psi / minute Air breaks and CompressTx Pressure breathing periods DecompressDecompress Begins Reached (leave unused spaces Begins Ends blank) Chamber Pressure (ATA)1 2.5 2.5 2.5 2.5 2.5 --2.5 1 Clock Time (24 hr) 13:01 13:13 [id number] 15:05 Treatment Length: 124 (minutes) Treatment Segments: 4 Vital Signs Capillary Blood Glucose Reference Range: 80 - 120 mg / dl HBO Diabetic Blood Glucose Intervention Range: <131 mg/dl or >249 mg/dl Time Vitals Blood Respiratory Capillary Blood Glucose Pulse Action Type: Pulse: Temperature: Taken: Pressure: Rate: Glucose (mg/dl): Meter #: Oximetry (%) Taken: Pre 13:05 169/72 79 18 98 Post 15:07 142/79 67 15 97.8 Treatment Response Treatment Toleration: Well Treatment Completion Treatment Completed without Adverse Event Status: Physician HBO Attestation: I certify that I supervised this HBO treatment in accordance with Medicare guidelines. A trained Yes emergency response team is readily available  per hospital policies and procedures. Continue HBOT as ordered. Yes Electronic Signature(s) Signed: 10/26/2019 3:47:03 PM By: [name] PA-C Entered By: [name] on 10/26/2019 15:47:02 -------------------------------------------------------------------------------- HBO Safety Checklist Details Patient Name: Date of Service: [name] [name], [name] [name] 10/26/2019 1:00 PM Medical Record [id number] Patient Account Number: [id number] Date of Birth/Sex: Treating RN: 07-02-1956 (63 y.o. M) Primary Care Avontae Burkhead: [name] Other Clinician: Mikeal Hawthorne Referring Jaki Steptoe: Treating Marcquis Ridlon/Extender:Stone III, Joannie Springs, [name] in Treatment: 6 HBO Safety Checklist Items Safety Checklist Consent Form Signed Patient voided / foley secured and emptied When did you last eato n/a Last dose of injectable or oral agent n/a NA Ostomy pouch emptied and vented if applicable NA All implantable devices assessed, documented and approved NA Intravenous access site secured and place Valuables secured Linens and cotton and cotton/polyester blend (less than 51% polyester) Personal oil-based products / skin lotions / body lotions removed NA Wigs or hairpieces removed NA Smoking or tobacco materials removed Books / newspapers / magazines / loose paper removed Cologne, aftershave, perfume and deodorant removed Jewelry removed (may wrap wedding band) NA Make-up removed Hair care products removed NA Battery operated devices (external) removed NA Heating patches and chemical warmers removed NA Titanium eyewear removed NA Nail polish cured greater than 10 hours NA Casting material cured greater than 10 hours NA Hearing aids removed NA Loose dentures or partials removed NA Prosthetics have been removed Patient demonstrates correct use of air break device (if applicable) Patient concerns have been addressed Patient grounding bracelet on and cord attached to  chamber Specifics for Inpatients (complete in addition to above) Medication sheet sent with patient Intravenous medications needed or due during therapy sent with patient Drainage tubes (e.g. nasogastric tube or chest tube secured and vented) Endotracheal or Tracheotomy tube secured Cuff deflated of air and inflated with saline Airway suctioned Electronic Signature(s) Signed: 10/26/2019 1:21:23 PM By: Mikeal Hawthorne  EMT/HBOT Entered By: Mikeal Hawthorne on 10/26/2019 13:21:22

## 2019-10-27 ENCOUNTER — Other Ambulatory Visit: Payer: Self-pay

## 2019-10-27 ENCOUNTER — Encounter (HOSPITAL_BASED_OUTPATIENT_CLINIC_OR_DEPARTMENT_OTHER): Payer: Federal, State, Local not specified - PPO | Admitting: Internal Medicine

## 2019-10-27 DIAGNOSIS — N3041 Irradiation cystitis with hematuria: Secondary | ICD-10-CM | POA: Diagnosis not present

## 2019-10-27 NOTE — Progress Notes (Signed)
RALPHEL, WOHLRAB ([id number]) Visit Report for 10/27/2019 SuperBill Details Patient Name: Date of Service: [name] [name], [name] [name] 10/27/2019 Medical Record [id number] Patient Account Number: [id number] Date of Birth/Sex: Treating RN: [birth year] (63 y.o. 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

## 2019-10-27 NOTE — Progress Notes (Addendum)
[name] [name], [name] [name] ([id number]) Visit Report for 10/27/2019 HBO Details Patient Name: Date of Service: [name] [name], [name] [name] 10/27/2019 1:00 PM Medical Record [id number] Patient Account Number: [id number] Date of Birth/Sex: Treating RN: November 27, 1955 (62 y.o. M) Primary Care Warrick Llera: [name] Other Clinician: Mikeal Hawthorne Referring Calyse Murcia: Treating Deunta Beneke/Extender:[name], [name], [name] in Treatment: 6 HBO Treatment Course Details Treatment Course Number: 1 Ordering Cheveyo [place]: [name] Total Treatments Ordered: 40 HBO Treatment Start Date: 09/19/2019 HBO Indication: Late Effect of Radiation HBO Treatment Details Treatment Number: 26 Patient Type: Outpatient Chamber Type: Monoplace Chamber Serial #: [id number] Treatment Protocol: 2.5 ATA with 90 minutes oxygen, with two 5 minute air breaks Treatment Details Compression Rate Down: 2.0 psi / minute De-Compression Rate Up: 2.0 psi / minute Air breaks and CompressTx Pressure breathing periods DecompressDecompress Begins Reached (leave unused spaces Begins Ends blank) Chamber Pressure (ATA)1 2.5 2.5 2.5 2.5 2.5 --2.5 1 Clock Time (24 hr) 09:27 09:39 10:0910:1410:4410:49--11:19 11:31 Treatment Length: 124 (minutes) Treatment Segments: 4 Vital Signs Capillary Blood Glucose Reference Range: 80 - 120 mg / dl HBO Diabetic Blood Glucose Intervention Range: <131 mg/dl or >249 mg/dl Time Vitals Blood Respiratory Capillary Blood Glucose Pulse Action Type: Pulse: Temperature: Taken: Pressure: Rate: Glucose (mg/dl): Meter #: Oximetry (%) Taken: Pre 09:05 144/78 71 17 98 Post 11:33 158/88 68 15 98.2 Treatment Response Treatment Toleration: Well Treatment Completion Treatment Completed without Adverse Event Status: Ikram Riebe Notes No concerns with treatment given Physician HBO Attestation: I certify that I supervised this HBO treatment in accordance with Medicare guidelines. A  trained Yes Yes emergency response team is readily available per hospital policies and procedures. Continue HBOT as ordered. Yes Electronic Signature(s) Signed: 10/27/2019 12:25:31 PM By: [name] MD Previous Signature: 10/27/2019 11:48:58 AM Version By: Mikeal Hawthorne EMT/HBOT Entered By: [name] on 10/27/2019 12:23:25 -------------------------------------------------------------------------------- HBO Safety Checklist Details Patient Name: Date of Service: [name] [name] 10/27/2019 1:00 PM Medical Record [id number] Patient Account Number: [id number] Date of Birth/Sex: Treating RN: July 22, 1956 (63 y.o. M) Primary Care Kamron Portee: [name] Other Clinician: Mikeal Hawthorne Referring Jamey Harman: Treating Tarique Loveall/Extender:[name], [name], [name] in Treatment: 6 HBO Safety Checklist Items Safety Checklist Consent Form Signed Patient voided / foley secured and emptied When did you last eato n/a Last dose of injectable or oral agent n/a NA Ostomy pouch emptied and vented if applicable NA All implantable devices assessed, documented and approved NA Intravenous access site secured and place Valuables secured Linens and cotton and cotton/polyester blend (less than 51% polyester) Personal oil-based products / skin lotions / body lotions removed NA Wigs or hairpieces removed NA Smoking or tobacco materials removed Books / newspapers / magazines / loose paper removed Cologne, aftershave, perfume and deodorant removed Jewelry removed (may wrap wedding band) NA Make-up removed Hair care products removed NA Battery operated devices (external) removed NA Heating patches and chemical warmers removed NA Titanium eyewear removed NA Nail polish cured greater than 10 hours NA Casting material cured greater than 10 hours NA Hearing aids removed NA Loose dentures or partials removed NA Prosthetics have been removed Patient demonstrates correct use  of air break device (if applicable) Patient concerns have been addressed Patient grounding bracelet on and cord attached to chamber Specifics for Inpatients (complete in addition to above) Medication sheet sent with patient Intravenous medications needed or due during therapy sent with patient Drainage tubes (e.g. nasogastric tube or chest tube secured and vented) Endotracheal or Tracheotomy tube secured Cuff deflated of air and  inflated with saline Airway suctioned Electronic Signature(s) Signed: 10/27/2019 10:00:51 AM By: Mikeal Hawthorne EMT/HBOT Entered By: Mikeal Hawthorne on 10/27/2019 10:00:51

## 2019-10-27 NOTE — Progress Notes (Signed)
LADARRIAN, [name] ([id number]) Visit Report for 10/27/2019 Arrival Information Details Patient Name: Date of Service: [name] [name], [name] [name] 10/27/2019 1:00 PM Medical Record [id number] Patient Account Number: [id number] Date of Birth/Sex: Treating RN: 1956/09/05 (63 y.o. M) Primary Care Dillinger Aston: [name] Other Clinician: Mikeal Hawthorne Referring Royelle Hinchman: Treating Aquiles Ruffini/Extender:[name], [name], [name] in Treatment: 6 Visit Information History Since Last Visit Added or deleted any medications: No Patient Arrived: Ambulatory Any new allergies or adverse reactions: No Arrival Time: 09:00 Had a fall or experienced change in No Accompanied By: self activities of daily living that may affect Transfer Assistance: None risk of falls: Patient Identification Verified: Yes Signs or symptoms of abuse/neglect since last No Secondary Verification Process Yes visito Completed: Hospitalized since last visit: No Patient Requires Transmission-Based No Implantable device outside of the clinic excluding No Precautions: cellular tissue based products placed in the center Patient Has Alerts: No since last visit: Pain Present Now: No Electronic Signature(s) Signed: 10/27/2019 11:48:58 AM By: Mikeal Hawthorne EMT/HBOT Entered By: Mikeal Hawthorne on 10/27/2019 10:00:00 -------------------------------------------------------------------------------- Encounter Discharge Information Details Patient Name: Date of Service: [name] [name] 10/27/2019 1:00 PM Medical Record [id number] Patient Account Number: [id number] Date of Birth/Sex: Treating RN: 1956-01-13 (63 y.o. M) Primary Care Catlin Doria: [name] Other Clinician: Mikeal Hawthorne Referring Elvenia Godden: Treating Lekeya Rollings/Extender:[name], [name], [name] in Treatment: 6 Encounter Discharge Information Items Discharge Condition: Stable Ambulatory Status: Ambulatory Discharge  Destination: Home Transportation: Private Auto Accompanied By: self Schedule Follow-up Appointment: Yes Clinical Summary of Care: Patient Declined Electronic Signature(s) Signed: 10/27/2019 11:48:58 AM By: Mikeal Hawthorne EMT/HBOT Entered By: Mikeal Hawthorne on 10/27/2019 11:48:28 -------------------------------------------------------------------------------- Patient/Caregiver Education Details Patient Name: [name] [name] 12/24/2020andnbsp1:00 Date of Service: PM Medical Record [id number] Number: Patient Account Number: [id number] Treating RN: [birth year] (63 y.o. Date of Birth/Gender: M) Other Clinician: Mikeal Hawthorne Primary Care Physician: [name] Treating [name] Referring Physician: Physician/Extender: [name] in Treatment: 6 Education Assessment Education Provided To: Patient Education Topics Provided Hyperbaric Oxygenation: Methods: Explain/Verbal Responses: State content correctly Electronic Signature(s) Signed: 10/27/2019 11:48:58 AM By: Mikeal Hawthorne EMT/HBOT Entered By: Mikeal Hawthorne on 10/27/2019 11:48:14 -------------------------------------------------------------------------------- Vitals Details Patient Name: Date of Service: [name] [name] 10/27/2019 1:00 PM Medical Record [id number] Patient Account Number: [id number] Date of Birth/Sex: Treating RN: 02-19-1956 (63 y.o. M) Primary Care [name] [name]: [name] Other Clinician: Mikeal Hawthorne Referring Laquan Beier: Treating Clent Damore/Extender:[name], [name], [name] in Treatment: 6 Vital Signs Time Taken: 09:05 Temperature (F): 98 Pulse (bpm): 71 Respiratory Rate (breaths/min): 17 Blood Pressure (mmHg): 144/78 Reference Range: 80 - 120 mg / dl Electronic Signature(s) Signed: 10/27/2019 11:48:58 AM By: Mikeal Hawthorne EMT/HBOT Entered By: Mikeal Hawthorne on 10/27/2019 10:00:15

## 2019-10-31 ENCOUNTER — Other Ambulatory Visit: Payer: Self-pay

## 2019-10-31 ENCOUNTER — Encounter (HOSPITAL_BASED_OUTPATIENT_CLINIC_OR_DEPARTMENT_OTHER): Payer: Federal, State, Local not specified - PPO | Admitting: Internal Medicine

## 2019-10-31 DIAGNOSIS — N3041 Irradiation cystitis with hematuria: Secondary | ICD-10-CM | POA: Diagnosis not present

## 2019-10-31 NOTE — Progress Notes (Addendum)
[name], [name] ([name] [name]) Visit Report for 10/31/2019 HBO Details Patient Name: Date of Service: [name] [name], [name] [name] 10/31/2019 1:00 PM Medical Record [name] [name] Patient Account Number: [id number] Date of Birth/Sex: Treating RN: Jul 01, 1956 (63 y.o. M) Primary Care [name] [name]: [name] [name] Other Clinician: Mikeal [name] Referring [name] [name]: Treating [name] [name]/Extender:[name] [name] in Treatment: 6 HBO Treatment Course Details Treatment Course Number: 1 Ordering [name] [name]: [name] [name] Total Treatments Ordered: 40 HBO Treatment Start Date: 09/19/2019 HBO Indication: Late Effect of Radiation HBO Treatment Details Treatment Number: 27 Patient Type: Outpatient Chamber Type: Monoplace Chamber Serial #: [id number] Treatment Protocol: 2.5 ATA with 90 minutes oxygen, with two 5 minute air breaks Treatment Details Compression Rate Down: 2.0 psi / minute De-Compression Rate Up: 2.0 psi / minute Air breaks and CompressTx Pressure breathing periods DecompressDecompress Begins Reached (leave unused spaces Begins Ends blank) Chamber Pressure (ATA)1 2.5 2.5 2.5 2.5 2.5 --2.5 1 Clock Time (24 hr) 13:04 13:16 13:4613:5114:2114:26--14:56 15:08 Treatment Length: 124 (minutes) Treatment Segments: 4 Vital Signs Capillary Blood Glucose Reference Range: 80 - 120 mg / dl HBO Diabetic Blood Glucose Intervention Range: <131 mg/dl or >249 mg/dl Time Vitals Blood Respiratory Capillary Blood Glucose Pulse Action Type: Pulse: Temperature: Taken: Pressure: Rate: Glucose (mg/dl): Meter #: Oximetry (%) Taken: Pre 12:55 174/91 74 15 98.2 Post 15:10 151/99 68 14 98 Treatment Response Treatment Toleration: Well Treatment Completion Treatment Completed without Adverse Event Status: [name] [name] Notes No concerns with treatment given Physician HBO Attestation: I certify that I supervised this HBO treatment in accordance with Medicare guidelines. A  trained Yes Yes emergency response team is readily available per hospital policies and procedures. Continue HBOT as ordered. Yes Electronic Signature(s) Signed: 10/31/2019 6:10:19 PM By: [name] Ham MD Previous Signature: 10/31/2019 4:06:01 PM Version By: [name] [name] EMT/HBOT Entered By: [name] [name] on 10/31/2019 18:08:52 -------------------------------------------------------------------------------- HBO Safety Checklist Details Patient Name: Date of Service: [name] [name] 10/31/2019 1:00 PM Medical Record [name] [name] Patient Account Number: [id number] Date of Birth/Sex: Treating RN: Jul 04, 1956 (63 y.o. M) Primary Care [name] [name]: [name] [name] Other Clinician: Mikeal [name] Referring [name] [name]: Treating [name] [name]/Extender:[name] [name] in Treatment: 6 HBO Safety Checklist Items Safety Checklist Consent Form Signed Patient voided / foley secured and emptied When did you last eato n/a Last dose of injectable or oral agent n/a NA Ostomy pouch emptied and vented if applicable NA All implantable devices assessed, documented and approved NA Intravenous access site secured and place Valuables secured Linens and cotton and cotton/polyester blend (less than 51% polyester) Personal oil-based products / skin lotions / body lotions removed NA Wigs or hairpieces removed NA Smoking or tobacco materials removed Books / newspapers / magazines / loose paper removed Cologne, aftershave, perfume and deodorant removed Jewelry removed (may wrap wedding band) NA Make-up removed Hair care products removed NA Battery operated devices (external) removed NA Heating patches and chemical warmers removed NA Titanium eyewear removed NA Nail polish cured greater than 10 hours NA Casting material cured greater than 10 hours NA Hearing aids removed NA Loose dentures or partials removed NA Prosthetics have been removed Patient demonstrates correct use  of air break device (if applicable) Patient concerns have been addressed Patient grounding bracelet on and cord attached to chamber Specifics for Inpatients (complete in addition to above) Medication sheet sent with patient Intravenous medications needed or due during therapy sent with patient Drainage tubes (e.g. nasogastric tube or chest tube secured and vented) Endotracheal or Tracheotomy tube secured Cuff deflated of air and  inflated with saline Airway suctioned Electronic Signature(s) Signed: 10/31/2019 1:27:26 PM By: [name] [name] EMT/HBOT Entered By: [name] [name] on 10/31/2019 13:27:25

## 2019-10-31 NOTE — Progress Notes (Signed)
[name] [name], [name] [name] ([id number]) Visit Report for 10/31/2019 SuperBill Details Patient Name: Date of Service: [name] [name], [name] [name] 10/31/2019 Medical Record [id number] Patient Account Number: [id number] Date of Birth/Sex: Treating RN: [birth year] (63 y.o. 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

## 2019-10-31 NOTE — Progress Notes (Signed)
PARTHA, HORTA ([id number]) Visit Report for 10/31/2019 Arrival Information Details Patient Name: Date of Service: [name] [name], [name] [name] 10/31/2019 1:00 PM Medical Record [id number] Patient Account Number: [id number] Date of Birth/Sex: Treating RN: 08/28/1956 (63 y.o. M) Primary Care Radie Berges: [name] Other Clinician: Mikeal Hawthorne Referring Lahela Woodin: Treating Zabrina Brotherton/Extender:[name], [name], [name] in Treatment: 6 Visit Information History Since Last Visit Added or deleted any medications: No Patient Arrived: Ambulatory Any new allergies or adverse reactions: No Arrival Time: 12:50 Had a fall or experienced change in No Accompanied By: self activities of daily living that may affect Transfer Assistance: None risk of falls: Patient Identification Verified: Yes Signs or symptoms of abuse/neglect since last No Secondary Verification Process Yes visito Completed: Hospitalized since last visit: No Patient Requires Transmission-Based No Implantable device outside of the clinic excluding No Precautions: cellular tissue based products placed in the center Patient Has Alerts: No since last visit: Pain Present Now: No Electronic Signature(s) Signed: 10/31/2019 4:06:01 PM By: Mikeal Hawthorne EMT/HBOT Entered By: Mikeal Hawthorne on 10/31/2019 13:26:39 -------------------------------------------------------------------------------- Encounter Discharge Information Details Patient Name: Date of Service: [name] 10/31/2019 1:00 PM Medical Record [id number] Patient Account Number: [id number] Date of Birth/Sex: Treating RN: [birth year] (63 y.o. M) Primary Care Charna Neeb: [name] Other Clinician: Mikeal Hawthorne Referring Trapper Meech: Treating Yakima Kreitzer/Extender:[name], [name], [name] in Treatment: 6 Encounter Discharge Information Items Discharge Condition: Stable Ambulatory Status: Ambulatory Discharge  Destination: Home Transportation: Private Auto Accompanied By: self Schedule Follow-up Appointment: Yes Clinical Summary of Care: Patient Declined Electronic Signature(s) Signed: 10/31/2019 4:06:01 PM By: Mikeal Hawthorne EMT/HBOT Entered By: Mikeal Hawthorne on 10/31/2019 15:34:39 -------------------------------------------------------------------------------- Patient/Caregiver Education Details Patient Name: [name] 12/28/2020andnbsp1:00 Date of Service: PM Medical Record [id number] Number: Patient Account Number: [id number] Treating RN: [birth year] (63 y.o. Date of Birth/Gender: M) Other Clinician: Mikeal Hawthorne Primary Care Physician: [name] Treating [name] Referring Physician: Physician/Extender: [name] in Treatment: 6 Education Assessment Education Provided To: Patient Education Topics Provided Hyperbaric Oxygenation: Methods: Explain/Verbal Responses: State content correctly Electronic Signature(s) Signed: 10/31/2019 4:06:01 PM By: Mikeal Hawthorne EMT/HBOT Entered By: Mikeal Hawthorne on 10/31/2019 15:34:27 -------------------------------------------------------------------------------- Vitals Details Patient Name: Date of Service: [name] 10/31/2019 1:00 PM Medical Record [id number] Patient Account Number: [id number] Date of Birth/Sex: Treating RN: [birth year] (63 y.o. M) Primary Care Magan Winnett: [name] Other Clinician: Referring Marigold Mom: Treating Abu Heavin/Extender:[name], [name], [name] in Treatment: 6 Vital Signs Time Taken: 12:55 Temperature (F): 98.2 Pulse (bpm): 74 Respiratory Rate (breaths/min): 15 Blood Pressure (mmHg): 174/91 Reference Range: 80 - 120 mg / dl Electronic Signature(s) Signed: 10/31/2019 4:06:01 PM By: Mikeal Hawthorne EMT/HBOT Entered By: Mikeal Hawthorne on 10/31/2019 13:26:50

## 2019-11-01 ENCOUNTER — Encounter (HOSPITAL_BASED_OUTPATIENT_CLINIC_OR_DEPARTMENT_OTHER): Payer: Federal, State, Local not specified - PPO | Admitting: Internal Medicine

## 2019-11-01 DIAGNOSIS — N3041 Irradiation cystitis with hematuria: Secondary | ICD-10-CM | POA: Diagnosis not present

## 2019-11-01 NOTE — Progress Notes (Signed)
NICKLOS, COBBS ([id number]) Visit Report for 11/01/2019 SuperBill Details Patient Name: Date of Service: [name] [name], [name] [name] 11/01/2019 Medical Record [id number] Patient Account Number: [id number] Date of Birth/Sex: Treating RN: 1956-10-15 (63 y.o. 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

## 2019-11-01 NOTE — Progress Notes (Addendum)
[name] [name], [name] [name] ([id number]) Visit Report for 11/01/2019 HBO Details Patient Name: Date of Service: [name] [name], [name] [name] 11/01/2019 1:00 PM Medical Record [id number] Patient Account Number: [id number] Date of Birth/Sex: Treating RN: 1956/04/25 (63 y.o. M) Primary Care Amaal Dimartino: [name] Other Clinician: Mikeal Hawthorne Referring Rockie Vawter: Treating Tylan Briguglio/Extender:[name], [name], [name] in Treatment: 7 HBO Treatment Course Details Treatment Course Number: 1 Ordering Riddick Nuon: [name] Total Treatments Ordered: 40 HBO Treatment Start Date: 09/19/2019 HBO Indication: Late Effect of Radiation HBO Treatment Details Treatment Number: 28 Patient Type: Outpatient Chamber Type: Monoplace Chamber Serial #: [id number] Treatment Protocol: 2.5 ATA with 90 minutes oxygen, with two 5 minute air breaks Treatment Details Compression Rate Down: 2.0 psi / minute De-Compression Rate Up: 2.0 psi / minute Air breaks and CompressTx Pressure breathing periods DecompressDecompress Begins Reached (leave unused spaces Begins Ends blank) Chamber Pressure (ATA)1 2.5 2.5 2.5 2.5 2.5 --2.5 1 Clock Time (24 hr) 13:03 13:15 13:4513:5014:2014:25--14:55 15:07 Treatment Length: 124 (minutes) Treatment Segments: 4 Vital Signs Capillary Blood Glucose Reference Range: 80 - 120 mg / dl HBO Diabetic Blood Glucose Intervention Range: <131 mg/dl or >249 mg/dl Time Vitals Blood Respiratory Capillary Blood Glucose Pulse Action Type: Pulse: Temperature: Taken: Pressure: Rate: Glucose (mg/dl): Meter #: Oximetry (%) Taken: Pre 13:00 170/81 84 18 98.1 Post 15:10 152/75 62 16 97.7 Treatment Response Treatment Toleration: Well Treatment Completion Treatment Completed without Adverse Event Status: [name] Notes No concerns with treatment given Physician HBO Attestation: I certify that I supervised this HBO treatment in accordance with Medicare guidelines. A  trained Yes Yes emergency response team is readily available per hospital policies and procedures. Continue HBOT as ordered. Yes Electronic Signature(s) Signed: 11/01/2019 6:15:30 PM By: [name] MD Previous Signature: 11/01/2019 3:43:24 PM Version By: Mikeal Hawthorne EMT/HBOT Entered By: [name] on 11/01/2019 17:45:21 -------------------------------------------------------------------------------- HBO Safety Checklist Details Patient Name: Date of Service: [name] [name] 11/01/2019 1:00 PM Medical Record [id number] Patient Account Number: [id number] Date of Birth/Sex: Treating RN: 04-Dec-1955 (63 y.o. M) Primary Care Lorea Kupfer: [name] Other Clinician: Mikeal Hawthorne Referring Tierria Watson: Treating Daunte Oestreich/Extender:[name], [name], [name] in Treatment: 7 HBO Safety Checklist Items Safety Checklist Consent Form Signed Patient voided / foley secured and emptied When did you last eato n/a Last dose of injectable or oral agent n/a NA Ostomy pouch emptied and vented if applicable NA All implantable devices assessed, documented and approved NA Intravenous access site secured and place Valuables secured Linens and cotton and cotton/polyester blend (less than 51% polyester) Personal oil-based products / skin lotions / body lotions removed NA Wigs or hairpieces removed NA Smoking or tobacco materials removed Books / newspapers / magazines / loose paper removed Cologne, aftershave, perfume and deodorant removed Jewelry removed (may wrap wedding band) NA Make-up removed Hair care products removed NA Battery operated devices (external) removed NA Heating patches and chemical warmers removed NA Titanium eyewear removed NA Nail polish cured greater than 10 hours NA Casting material cured greater than 10 hours NA Hearing aids removed NA Loose dentures or partials removed NA Prosthetics have been removed Patient demonstrates correct use  of air break device (if applicable) Patient concerns have been addressed Patient grounding bracelet on and cord attached to chamber Specifics for Inpatients (complete in addition to above) Medication sheet sent with patient Intravenous medications needed or due during therapy sent with patient Drainage tubes (e.g. nasogastric tube or chest tube secured and vented) Endotracheal or Tracheotomy tube secured Cuff deflated of air and  inflated with saline Airway suctioned Electronic Signature(s) Signed: 11/01/2019 1:31:58 PM By: Mikeal Hawthorne EMT/HBOT Entered By: Mikeal Hawthorne on 11/01/2019 13:31:57

## 2019-11-01 NOTE — Progress Notes (Signed)
[name], [name] ([id number]) Visit Report for 11/01/2019 Arrival Information Details Patient Name: Date of Service: [name] [name], [name] [name] 11/01/2019 1:00 PM Medical Record [id number] Patient Account Number: [id number] Date of Birth/Sex: Treating RN: [birth year] (63 y.o. M) Primary Care [name] [name]: [name] [name] Other Clinician: Mikeal [name] Referring Ren Grasse: Treating [name] [name]/Extender:[name] [name] in Treatment: 7 Visit Information History Since Last Visit Added or deleted any medications: No Patient Arrived: Ambulatory Any new allergies or adverse reactions: No Arrival Time: 12:55 Had a fall or experienced change in No Accompanied By: self activities of daily living that may affect Transfer Assistance: None risk of falls: Patient Identification Verified: Yes Signs or symptoms of abuse/neglect since last No Secondary Verification Process Yes visito Completed: Hospitalized since last visit: No Patient Requires Transmission-Based No Implantable device outside of the clinic excluding No Precautions: cellular tissue based products placed in the center Patient Has Alerts: No since last visit: Pain Present Now: No Electronic Signature(s) Signed: 11/01/2019 3:43:24 PM By: [name] [name] EMT/HBOT Entered By: [name] [name] on 11/01/2019 13:31:12 -------------------------------------------------------------------------------- Encounter Discharge Information Details Patient Name: Date of Service: [name] [name] 11/01/2019 1:00 PM Medical Record [id number] Patient Account Number: [id number] Date of Birth/Sex: Treating RN: August 09, 1956 (63 y.o. M) Primary Care [name] [name]: [name] [name] Other Clinician: Mikeal [name] Referring [name] [name]: Treating [name] [name]/Extender:[name] [name] in Treatment: 7 Encounter Discharge Information Items Discharge Condition: Stable Ambulatory Status: Ambulatory Discharge  Destination: Home Transportation: Private Auto Accompanied By: self Schedule Follow-up Appointment: Yes Clinical Summary of Care: Patient Declined Electronic Signature(s) Signed: 11/01/2019 3:43:24 PM By: [name] [name] EMT/HBOT Entered By: [name] [name] on 11/01/2019 15:42:56 -------------------------------------------------------------------------------- Patient/Caregiver Education Details Patient Name: [name] [name] 12/29/2020andnbsp1:00 Date of Service: PM Medical Record [id number] Number: Patient Account Number: [id number] Treating RN: 12-29-1955 (63 y.o. Date of Birth/Gender: M) Other Clinician: Mikeal [name] Primary Care Physician: [name] [name] Treating [name] [name] Referring Physician: Physician/Extender: [name] [name] in Treatment: 7 Education Assessment Education Provided To: Patient Education Topics Provided Hyperbaric Oxygenation: Methods: Explain/Verbal Responses: State content correctly Electronic Signature(s) Signed: 11/01/2019 3:43:24 PM By: [name] [name] EMT/HBOT Entered By: [name] [name] on 11/01/2019 15:42:45 -------------------------------------------------------------------------------- Vitals Details Patient Name: Date of Service: [name] [name] 11/01/2019 1:00 PM Medical Record [id number] Patient Account Number: [id number] Date of Birth/Sex: Treating RN: 06/26/1956 (63 y.o. M) Primary Care [name] [name]: [name] [name] Other Clinician: Mikeal [name] Referring [name] [name]: Treating [name] [name]/Extender:[name] [name] in Treatment: 7 Vital Signs Time Taken: 13:00 Temperature (F): 98.1 Pulse (bpm): 84 Respiratory Rate (breaths/min): 18 Blood Pressure (mmHg): 170/81 Reference Range: 80 - 120 mg / dl Electronic Signature(s) Signed: 11/01/2019 3:43:24 PM By: [name] [name] EMT/HBOT Entered By: [name] [name] on 11/01/2019 13:31:24

## 2019-11-02 ENCOUNTER — Other Ambulatory Visit: Payer: Self-pay

## 2019-11-02 ENCOUNTER — Encounter (HOSPITAL_BASED_OUTPATIENT_CLINIC_OR_DEPARTMENT_OTHER): Payer: Federal, State, Local not specified - PPO | Admitting: Internal Medicine

## 2019-11-02 DIAGNOSIS — N3041 Irradiation cystitis with hematuria: Secondary | ICD-10-CM | POA: Diagnosis not present

## 2019-11-02 NOTE — Progress Notes (Signed)
TEIGE, GARFIAS ([id number]) Visit Report for 11/02/2019 Arrival Information Details Patient Name: Date of Service: [name] [name], [name] [name] 11/02/2019 1:00 PM Medical Record [id number] Patient Account Number: [id number] Date of Birth/Sex: Treating RN: 17-Aug-1956 (63 y.o. M) Primary Care Milca Sytsma: [name] Other Clinician: Mikeal Hawthorne Referring Chihiro Frey: Treating Benjamen Koelling/Extender:Madduri, Rogelia Boga, [name] in Treatment: 7 Visit Information History Since Last Visit Added or deleted any medications: No Patient Arrived: Ambulatory Any new allergies or adverse reactions: No Arrival Time: 13:00 Had a fall or experienced change in No Accompanied By: self activities of daily living that may affect Transfer Assistance: None risk of falls: Patient Identification Verified: Yes Signs or symptoms of abuse/neglect since last No Secondary Verification Process Yes visito Completed: Hospitalized since last visit: No Patient Requires Transmission-Based No Implantable device outside of the clinic excluding No Precautions: cellular tissue based products placed in the center Patient Has Alerts: No since last visit: Pain Present Now: No Electronic Signature(s) Signed: 11/02/2019 3:39:15 PM By: Mikeal Hawthorne EMT/HBOT Entered By: Mikeal Hawthorne on 11/02/2019 13:47:05 -------------------------------------------------------------------------------- Encounter Discharge Information Details Patient Name: Date of Service: [name] [name] 11/02/2019 1:00 PM Medical Record [id number] Patient Account Number: [id number] Date of Birth/Sex: Treating RN: [birth year] (63 y.o. M) Primary Care Braydin Aloi: [name] Other Clinician: Mikeal Hawthorne Referring Sarin Comunale: Treating Cameran Pettey/Extender:Madduri, Rogelia Boga, [name] in Treatment: 7 Encounter Discharge Information Items Discharge Condition: Stable Ambulatory Status: Ambulatory Discharge  Destination: Home Transportation: Private Auto Accompanied By: self Schedule Follow-up Appointment: Yes Clinical Summary of Care: Patient Declined Electronic Signature(s) Signed: 11/02/2019 3:39:15 PM By: Mikeal Hawthorne EMT/HBOT Entered By: Mikeal Hawthorne on 11/02/2019 15:38:35 -------------------------------------------------------------------------------- Patient/Caregiver Education Details Patient Name: [name] [name] 12/30/2020andnbsp1:00 Date of Service: PM Medical Record [id number] Number: Patient Account Number: [id number] Treating RN: [birth year] (63 y.o. Date of Birth/Gender: M) Other Clinician: Mikeal Hawthorne Primary Care Physician: [name] Treating Tobi Bastos Referring Physician: Physician/Extender: [name] in Treatment: 7 Education Assessment Education Provided To: Patient Education Topics Provided Hyperbaric Oxygenation: Methods: Explain/Verbal Responses: State content correctly Electronic Signature(s) Signed: 11/02/2019 3:39:15 PM By: Mikeal Hawthorne EMT/HBOT Entered By: Mikeal Hawthorne on 11/02/2019 15:38:22 -------------------------------------------------------------------------------- Vitals Details Patient Name: Date of Service: [name] [name] 11/02/2019 1:00 PM Medical Record [id number] Patient Account Number: [id number] Date of Birth/Sex: Treating RN: 07/10/1956 (63 y.o. M) Primary Care Melbert Botelho: [name] Other Clinician: Mikeal Hawthorne Referring Karalyne Nusser: Treating Matia Zelada/Extender:Madduri, Rogelia Boga, [name] in Treatment: 7 Vital Signs Time Taken: 13:05 Temperature (F): 98 Pulse (bpm): 74 Respiratory Rate (breaths/min): 16 Blood Pressure (mmHg): 149/85 Reference Range: 80 - 120 mg / dl Electronic Signature(s) Signed: 11/02/2019 3:39:15 PM By: Mikeal Hawthorne EMT/HBOT Entered By: Mikeal Hawthorne on 11/02/2019 13:47:21

## 2019-11-02 NOTE — Progress Notes (Signed)
ABELL, SALZBERG ([id number]) Visit Report for 11/02/2019 SuperBill Details Patient Name: Date of Service: [name] [name], [name] [name] 11/02/2019 Medical Record [id number] Patient Account Number: [id number] Date of Birth/Sex: Treating RN: January 02, 1956 (63 y.o. 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

## 2019-11-02 NOTE — Progress Notes (Addendum)
[name] [name] ([id number]) Visit Report for 11/02/2019 HBO Details Patient Name: Date of Service: [name] [name], [name] [name] 11/02/2019 1:00 PM Medical Record [id number] Patient Account Number: [id number] Date of Birth/Sex: Treating RN: 01/21/1956 (63 y.o. M) Primary Care [name] [name]: [name] [name] Other Clinician: Mikeal [name] Referring [name] [name]: [name] [name], [name] [name], [name] [name] in Treatment: 7 HBO Treatment Course Details Treatment Course Number: 1 [name] [name] Total Treatments Ordered: 40 HBO Treatment Start Date: 09/19/2019 HBO Indication: Late Effect of Radiation HBO Treatment Details Treatment Number: 29 Patient Type: Outpatient Chamber Type: Monoplace Chamber Serial #: [id number] Treatment Protocol: 2.5 ATA with 90 minutes oxygen, with two 5 minute air breaks Treatment Details Compression Rate Down: 2.0 psi / minute De-Compression Rate Up: 2.0 psi / minute Air breaks and CompressTx Pressure breathing periods DecompressDecompress Begins Reached (leave unused spaces Begins Ends blank) Chamber Pressure (ATA)1 2.5 2.5 2.5 2.5 2.5 --2.5 1 Clock Time (24 hr) 13:14 13:26 [id number] 15:18 Treatment Length: 124 (minutes) Treatment Segments: 4 Vital Signs Capillary Blood Glucose Reference Range: 80 - 120 mg / dl HBO Diabetic Blood Glucose Intervention Range: <131 mg/dl or >249 mg/dl Time Vitals Blood Respiratory Capillary Blood Glucose Pulse Action Type: Pulse: Temperature: Taken: Pressure: Rate: Glucose (mg/dl): Meter #: Oximetry (%) Taken: Pre 13:05 149/85 74 16 98 Post 15:20 160/74 63 14 97.9 Treatment Response Treatment Toleration: Well Treatment Completion Treatment Completed without Adverse Event Status: Electronic Signature(s) Signed: 11/02/2019 3:39:15 PM By: [name] [name] EMT/HBOT Signed: 11/02/2019 5:10:58 PM By: Tobi Bastos MD, MBA Entered By: [name] [name] on 11/02/2019  15:37:56 -------------------------------------------------------------------------------- HBO Safety Checklist Details Patient Name: Date of Service: [name] [name] 11/02/2019 1:00 PM Medical Record [id number] Patient Account Number: [id number] Date of Birth/Sex: Treating RN: 1956-05-08 (63 y.o. M) Primary Care [name] [name]: [name] [name] Other Clinician: Mikeal [name] Referring [name] [name]: [name] [name] [name], [name] [name], [name] [name] in Treatment: 7 HBO Safety Checklist Items Safety Checklist Consent Form Signed Patient voided / foley secured and emptied When did you last eato n/a Last dose of injectable or oral agent n/a NA Ostomy pouch emptied and vented if applicable NA All implantable devices assessed, documented and approved NA Intravenous access site secured and place Valuables secured Linens and cotton and cotton/polyester blend (less than 51% polyester) Personal oil-based products / skin lotions / body lotions removed NA Wigs or hairpieces removed NA Smoking or tobacco materials removed Books / newspapers / magazines / loose paper removed Cologne, aftershave, perfume and deodorant removed Jewelry removed (may wrap wedding band) NA Make-up removed Hair care products removed NA Battery operated devices (external) removed NA Heating patches and chemical warmers removed NA Titanium eyewear removed NA Nail polish cured greater than 10 hours NA Casting material cured greater than 10 hours NA Hearing aids removed NA Loose dentures or partials removed NA Prosthetics have been removed Patient demonstrates correct use of air break device (if applicable) Patient concerns have been addressed Patient grounding bracelet on and cord attached to chamber Specifics for Inpatients (complete in addition to above) Medication sheet sent with patient Intravenous medications needed or due during therapy sent with patient Drainage tubes (e.g. nasogastric  tube or chest tube secured and vented) Endotracheal or Tracheotomy tube secured Cuff deflated of air and inflated with saline Airway suctioned Electronic Signature(s) Signed: 11/02/2019 1:48:06 PM By: [name] [name] EMT/HBOT Entered By: [name] [name] on 11/02/2019 13:48:05

## 2019-11-03 ENCOUNTER — Other Ambulatory Visit: Payer: Self-pay

## 2019-11-03 ENCOUNTER — Encounter (HOSPITAL_BASED_OUTPATIENT_CLINIC_OR_DEPARTMENT_OTHER): Payer: Federal, State, Local not specified - PPO | Admitting: Internal Medicine

## 2019-11-03 DIAGNOSIS — N3041 Irradiation cystitis with hematuria: Secondary | ICD-10-CM | POA: Diagnosis not present

## 2019-11-03 NOTE — Progress Notes (Signed)
AHMEER, GABIN ([id number]) Visit Report for 11/03/2019 SuperBill Details Patient Name: Date of Service: [name] [name], [name] [name] 11/03/2019 Medical Record [id number] Patient Account Number: [id number] Date of Birth/Sex: Treating RN: 1956-07-07 (63 y.o. 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

## 2019-11-03 NOTE — Progress Notes (Signed)
[name] [name], [name] [name] ([id number]) Visit Report for 11/03/2019 Arrival Information Details Patient Name: Date of Service: [name] [name], [name] [name] 11/03/2019 10:00 AM Medical Record [id number] Patient Account Number: [id number] Date of Birth/Sex: Treating RN: 1956-07-06 (63 y.o. M) Primary Care Tayden Nichelson: [name] Other Clinician: Mikeal Hawthorne Referring Charlot Gouin: Treating Camrynn Mcclintic/Extender:[name], [name], [name] in Treatment: 7 Visit Information History Since Last Visit Added or deleted any medications: No Patient Arrived: Ambulatory Any new allergies or adverse reactions: No Arrival Time: 09:50 Had a fall or experienced change in No Accompanied By: self activities of daily living that may affect Transfer Assistance: None risk of falls: Patient Identification Verified: Yes Signs or symptoms of abuse/neglect since last No Secondary Verification Process Yes visito Completed: Hospitalized since last visit: No Patient Requires Transmission-Based No Implantable device outside of the clinic excluding No Precautions: cellular tissue based products placed in the center Patient Has Alerts: No since last visit: Pain Present Now: No Electronic Signature(s) Signed: 11/03/2019 12:26:55 PM By: Mikeal Hawthorne EMT/HBOT Entered By: Mikeal Hawthorne on 11/03/2019 10:24:24 -------------------------------------------------------------------------------- Encounter Discharge Information Details Patient Name: Date of Service: [name] Baptise A. 11/03/2019 10:00 AM Medical Record [id number] Patient Account Number: [id number] Date of Birth/Sex: Treating RN: [birth year] (63 y.o. M) Primary Care Saturnino Liew: [name] Other Clinician: Mikeal Hawthorne Referring Korey Arroyo: Treating Assyria Morreale/Extender:[name], [name], [name] in Treatment: 7 Encounter Discharge Information Items Discharge Condition: Stable Ambulatory Status: Ambulatory Discharge  Destination: Home Transportation: Private Auto Accompanied By: self Schedule Follow-up Appointment: Yes Clinical Summary of Care: Patient Declined Electronic Signature(s) Signed: 11/03/2019 12:26:55 PM By: Mikeal Hawthorne EMT/HBOT Entered By: Mikeal Hawthorne on 11/03/2019 12:26:05 -------------------------------------------------------------------------------- Patient/Caregiver Education Details Patient Name: [name] [name] 12/31/2020andnbsp10:00 Date of Service: AM Medical Record [id number] Number: Patient Account Number: [id number] Treating RN: [birth year] (63 y.o. Date of Birth/Gender: M) Other Clinician: Mikeal Hawthorne Primary Care Treating Sheran Luz Physician: Physician/Extender: Referring Physician: [name] in Treatment: 7 Education Assessment Education Provided To: Patient Education Topics Provided Hyperbaric Oxygenation: Methods: Explain/Verbal Responses: State content correctly Electronic Signature(s) Signed: 11/03/2019 12:26:55 PM By: Mikeal Hawthorne EMT/HBOT Entered By: Mikeal Hawthorne on 11/03/2019 12:25:54 -------------------------------------------------------------------------------- Vitals Details Patient Name: Date of Service: [name] Baptise A. 11/03/2019 10:00 AM Medical Record [id number] Patient Account Number: [id number] Date of Birth/Sex: Treating RN: [birth year] (63 y.o. M) Primary Care Ernestina Joe: [name] Other Clinician: Mikeal Hawthorne Referring Liara Holm: Treating Felisa Zechman/Extender:[name], [name], [name] in Treatment: 7 Vital Signs Time Taken: 09:55 Temperature (F): 98.6 Pulse (bpm): 79 Respiratory Rate (breaths/min): 17 Blood Pressure (mmHg): 165/84 Reference Range: 80 - 120 mg / dl Electronic Signature(s) Signed: 11/03/2019 12:26:55 PM By: Mikeal Hawthorne EMT/HBOT Entered By: Mikeal Hawthorne on 11/03/2019 10:24:39

## 2019-11-03 NOTE — Progress Notes (Addendum)
[name] [name] ([id number]) Visit Report for 11/03/2019 HBO Details Patient Name: Date of Service: [name] [name] 11/03/2019 10:00 AM Medical Record [id number] Patient Account Number: [id number] Date of Birth/Sex: Treating RN: 03/23/1956 (63 y.o. M) Primary Care Tanner Yeley: [name] Other Clinician: Mikeal Hawthorne Referring Vera Wishart: Treating Leidi Astle/Extender:[name], [name], [name] in Treatment: 7 HBO Treatment Course Details Treatment Course Number: 1 Ordering Aidon Klemens: [name] Total Treatments Ordered: 40 HBO Treatment Start Date: 09/19/2019 HBO Indication: Late Effect of Radiation HBO Treatment Details Treatment Number: 30 Patient Type: Outpatient Chamber Type: Monoplace Chamber Serial #: [id number] Treatment Protocol: 2.5 ATA with 90 minutes oxygen, with two 5 minute air breaks Treatment Details Compression Rate Down: 2.0 psi / minute De-Compression Rate Up: 2.0 psi / minute Air breaks and CompressTx Pressure breathing periods DecompressDecompress Begins Reached (leave unused spaces Begins Ends blank) Chamber Pressure (ATA)1 2.5 2.5 2.5 2.5 2.5 --2.5 1 Clock Time (24 hr) 10:10 10:22 [id number] 12:14 Treatment Length: 124 (minutes) Treatment Segments: 4 Vital Signs Capillary Blood Glucose Reference Range: 80 - 120 mg / dl HBO Diabetic Blood Glucose Intervention Range: <131 mg/dl or >249 mg/dl Time Vitals Blood Respiratory Capillary Blood Glucose Pulse Action Type: Pulse: Temperature: Taken: Pressure: Rate: Glucose (mg/dl): Meter #: Oximetry (%) Taken: Pre 09:55 165/84 79 17 98.6 Post 12:17 162/81 68 15 98.1 Treatment Response Treatment Toleration: Well Treatment Completion Treatment Completed without Adverse Event Status: Jemari Hallum Notes No concerns with treatment given Physician HBO Attestation: I certify that I supervised this HBO treatment in accordance with Medicare guidelines. A  trained Yes Yes emergency response team is readily available per hospital policies and procedures. Continue HBOT as ordered. Yes Electronic Signature(s) Signed: 11/03/2019 2:35:12 PM By: [name] MD Previous Signature: 11/03/2019 12:26:55 PM Version By: Mikeal Hawthorne EMT/HBOT Entered By: [name] on 11/03/2019 13:09:37 -------------------------------------------------------------------------------- HBO Safety Checklist Details Patient Name: Date of Service: [name] [name]. 11/03/2019 10:00 AM Medical Record [id number] Patient Account Number: [id number] Date of Birth/Sex: Treating RN: Aug 15, 1956 (63 y.o. M) Primary Care Tyronne Blann: [name] Other Clinician: Mikeal Hawthorne Referring Toinette Lackie: Treating Bayley Hurn/Extender:[name], [name], [name] in Treatment: 7 HBO Safety Checklist Items Safety Checklist Consent Form Signed Patient voided / foley secured and emptied When did you last eato n/a Last dose of injectable or oral agent n/a NA Ostomy pouch emptied and vented if applicable NA All implantable devices assessed, documented and approved NA Intravenous access site secured and place Valuables secured Linens and cotton and cotton/polyester blend (less than 51% polyester) Personal oil-based products / skin lotions / body lotions removed NA Wigs or hairpieces removed NA Smoking or tobacco materials removed Books / newspapers / magazines / loose paper removed Cologne, aftershave, perfume and deodorant removed Jewelry removed (may wrap wedding band) NA Make-up removed Hair care products removed NA Battery operated devices (external) removed NA Heating patches and chemical warmers removed NA Titanium eyewear removed NA Nail polish cured greater than 10 hours NA Casting material cured greater than 10 hours NA Hearing aids removed NA Loose dentures or partials removed NA Prosthetics have been removed Patient demonstrates correct use  of air break device (if applicable) Patient concerns have been addressed Patient grounding bracelet on and cord attached to chamber Specifics for Inpatients (complete in addition to above) Medication sheet sent with patient Intravenous medications needed or due during therapy sent with patient Drainage tubes (e.g. nasogastric tube or chest tube secured and vented) Endotracheal or Tracheotomy tube secured Cuff deflated of air and  inflated with saline Airway suctioned Electronic Signature(s) Signed: 11/03/2019 10:25:10 AM By: Mikeal Hawthorne EMT/HBOT Entered By: Mikeal Hawthorne on 11/03/2019 10:25:10

## 2019-11-07 ENCOUNTER — Encounter (HOSPITAL_BASED_OUTPATIENT_CLINIC_OR_DEPARTMENT_OTHER): Payer: Federal, State, Local not specified - PPO | Admitting: Internal Medicine

## 2019-11-07 ENCOUNTER — Other Ambulatory Visit: Payer: Self-pay

## 2019-11-07 DIAGNOSIS — Z8546 Personal history of malignant neoplasm of prostate: Secondary | ICD-10-CM | POA: Insufficient documentation

## 2019-11-07 DIAGNOSIS — N3041 Irradiation cystitis with hematuria: Secondary | ICD-10-CM | POA: Diagnosis present

## 2019-11-07 DIAGNOSIS — R310 Gross hematuria: Secondary | ICD-10-CM | POA: Insufficient documentation

## 2019-11-07 NOTE — Progress Notes (Addendum)
SANTIEL, HOCKEY ([id number]) Visit Report for 11/07/2019 HBO Details Patient Name: Date of Service: [name] [name], [name] [name] 11/07/2019 1:00 PM Medical Record [id number] Patient Account Number: [id number] Date of Birth/Sex: Treating RN: 19-Oct-1956 (64 y.o. M) Primary Care [name] [name]: [name] [name] Other Clinician: Mikeal [name] Referring [name] [name]: Treating [name] [name]/Extender:[name] [name], [name] [name] in Treatment: 7 HBO Treatment Course Details Treatment Course Number: 1 Ordering [name] [name]: [name] [name] Total Treatments Ordered: 40 HBO Treatment Start Date: 09/19/2019 HBO Indication: Late Effect of Radiation HBO Treatment Details Treatment Number: 31 Patient Type: Outpatient Chamber Type: Monoplace Chamber Serial #: [id number] Treatment Protocol: 2.5 ATA with 90 minutes oxygen, with two 5 minute air breaks Treatment Details Compression Rate Down: 2.0 psi / minute De-Compression Rate Up: 2.0 psi / minute Air breaks and CompressTx Pressure breathing periods DecompressDecompress Begins Reached (leave unused spaces Begins Ends blank) Chamber Pressure (ATA)1 2.5 2.5 2.5 2.5 2.5 --2.5 1 Clock Time (24 hr) 13:07 13:19 [id number] 15:11 Treatment Length: 124 (minutes) Treatment Segments: 4 Vital Signs Capillary Blood Glucose Reference Range: 80 - 120 mg / dl HBO Diabetic Blood Glucose Intervention Range: <131 mg/dl or >249 mg/dl Time Vitals Blood Respiratory Capillary Blood Glucose Pulse Action Type: Pulse: Temperature: Taken: Pressure: Rate: Glucose (mg/dl): Meter #: Oximetry (%) Taken: Pre 12:50 164/73 70 15 97.6 Post 15:14 148/87 63 15 98 Treatment Response Treatment Toleration: Well Treatment Completion Treatment Completed without Adverse Event Status: [name] [name] Notes No concerns with treatment given Physician HBO Attestation: I certify that I supervised this HBO treatment in accordance with Medicare guidelines. A  trained Yes Yes emergency response team is readily available per hospital policies and procedures. Continue HBOT as ordered. Yes Electronic Signature(s) Signed: 11/07/2019 4:56:38 PM By: [name] Ham MD Previous Signature: 11/07/2019 4:12:00 PM Version By: [name] [name] EMT/HBOT Entered By: [name] [name] on 11/07/2019 16:55:17 -------------------------------------------------------------------------------- HBO Safety Checklist Details Patient Name: Date of Service: [name] [name]. 11/07/2019 1:00 PM Medical Record [id number] Patient Account Number: [id number] Date of Birth/Sex: Treating RN: 03/26/1956 (64 y.o. M) Primary Care [name] [name]: [name] [name] Other Clinician: Mikeal [name] Referring [name] [name]: Treating [name] [name]/Extender:[name] [name], [name] [name] in Treatment: 7 HBO Safety Checklist Items Safety Checklist Consent Form Signed Patient voided / foley secured and emptied When did you last eato n/a Last dose of injectable or oral agent n/a NA Ostomy pouch emptied and vented if applicable NA All implantable devices assessed, documented and approved NA Intravenous access site secured and place Valuables secured Linens and cotton and cotton/polyester blend (less than 51% polyester) Personal oil-based products / skin lotions / body lotions removed NA Wigs or hairpieces removed NA Smoking or tobacco materials removed Books / newspapers / magazines / loose paper removed Cologne, aftershave, perfume and deodorant removed Jewelry removed (may wrap wedding band) NA Make-up removed Hair care products removed NA Battery operated devices (external) removed NA Heating patches and chemical warmers removed NA Titanium eyewear removed NA Nail polish cured greater than 10 hours NA Casting material cured greater than 10 hours NA Hearing aids removed NA Loose dentures or partials removed NA Prosthetics have been removed Patient demonstrates correct use of air  break device (if applicable) Patient concerns have been addressed Patient grounding bracelet on and cord attached to chamber Specifics for Inpatients (complete in addition to above) Medication sheet sent with patient Intravenous medications needed or due during therapy sent with patient Drainage tubes (e.g. nasogastric tube or chest tube secured and vented) Endotracheal or Tracheotomy tube secured Cuff deflated of air and  inflated with saline Airway suctioned Electronic Signature(s) Signed: 11/07/2019 1:20:52 PM By: [name] [name] EMT/HBOT Entered By: [name] [name] on 11/07/2019 13:20:52

## 2019-11-07 NOTE — Progress Notes (Signed)
[name] [name], [name] [name] ([id number]) Visit Report for 11/07/2019 Arrival Information Details Patient Name: Date of Service: [name] [name], [name] [name] 11/07/2019 1:00 PM Medical Record [id number] Patient Account Number: [id number] Date of Birth/Sex: Treating RN: [birth year] (65 y.o. M) Primary Care Yousof Alderman: [name] Other Clinician: Mikeal Hawthorne Referring Aaradhya Kysar: Treating Lorree Millar/Extender:[name], [name], [name] in Treatment: 7 Visit Information History Since Last Visit Added or deleted any medications: No Patient Arrived: Ambulatory Any new allergies or adverse reactions: No Arrival Time: 12:45 Had a fall or experienced change in No Accompanied By: self activities of daily living that may affect Transfer Assistance: None risk of falls: Patient Identification Verified: Yes Signs or symptoms of abuse/neglect since last No Secondary Verification Process Yes visito Completed: Hospitalized since last visit: No Patient Requires Transmission-Based No Implantable device outside of the clinic excluding No Precautions: cellular tissue based products placed in the center Patient Has Alerts: No since last visit: Pain Present Now: No Electronic Signature(s) Signed: 11/07/2019 4:12:00 PM By: Mikeal Hawthorne EMT/HBOT Entered By: Mikeal Hawthorne on 11/07/2019 13:20:03 -------------------------------------------------------------------------------- Encounter Discharge Information Details Patient Name: Date of Service: [name] [name] 11/07/2019 1:00 PM Medical Record [id number] Patient Account Number: [id number] Date of Birth/Sex: Treating RN: 1956-01-15 (64 y.o. M) Primary Care Cadance Raus: [name] Other Clinician: Mikeal Hawthorne Referring Lexie Koehl: Treating Gera Inboden/Extender:[name], [name], [name] in Treatment: 7 Encounter Discharge Information Items Discharge Condition: Stable Ambulatory Status: Ambulatory Discharge Destination:  Home Transportation: Private Auto Accompanied By: self Schedule Follow-up Appointment: Yes Clinical Summary of Care: Patient Declined Electronic Signature(s) Signed: 11/07/2019 4:12:00 PM By: Mikeal Hawthorne EMT/HBOT Entered By: Mikeal Hawthorne on 11/07/2019 16:10:08 -------------------------------------------------------------------------------- Patient/Caregiver Education Details Patient Name: Date of Service: [name], [name] 1/4/2021andnbsp1:00 PM Medical Record [id number] Patient Account Number: [id number] Date of Birth/Gender: Treating RN: Apr 07, 1956 (63 y.o. M) Primary Care Physician: [name] Other Clinician: Mikeal Hawthorne Referring Physician: Treating Physician/Extender:[name], [name], [name] in Treatment: 7 Education Assessment Education Provided To: Patient Education Topics Provided Hyperbaric Oxygenation: Methods: Explain/Verbal Responses: State content correctly Electronic Signature(s) Signed: 11/07/2019 4:12:00 PM By: Mikeal Hawthorne EMT/HBOT Entered By: Mikeal Hawthorne on 11/07/2019 16:09:56 -------------------------------------------------------------------------------- Vitals Details Patient Name: Date of Service: [name] [name]. 11/07/2019 1:00 PM Medical Record [id number] Patient Account Number: [id number] Date of Birth/Sex: Treating RN: 10-01-1956 (64 y.o. M) Primary Care Jayden Rudge: [name] Other Clinician: Mikeal Hawthorne Referring Yoanna Jurczyk: Treating Anni Hocevar/Extender:[name], [name], [name] in Treatment: 7 Vital Signs Time Taken: 12:50 Temperature (F): 97.6 Pulse (bpm): 70 Respiratory Rate (breaths/min): 15 Blood Pressure (mmHg): 164/73 Reference Range: 80 - 120 mg / dl Electronic Signature(s) Signed: 11/07/2019 4:12:00 PM By: Mikeal Hawthorne EMT/HBOT Entered By: Mikeal Hawthorne on 11/07/2019 13:20:21

## 2019-11-07 NOTE — Progress Notes (Signed)
FAHED, GAFFKE ([id number]) Visit Report for 11/07/2019 SuperBill Details Patient Name: Date of Service: [name] [name], [name] [name] 11/07/2019 Medical Record [id number] Patient Account Number: [id number] Date of Birth/Sex: Treating RN: 08/14/1956 (64 y.o. 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

## 2019-11-08 ENCOUNTER — Encounter (HOSPITAL_BASED_OUTPATIENT_CLINIC_OR_DEPARTMENT_OTHER): Payer: Federal, State, Local not specified - PPO | Admitting: Internal Medicine

## 2019-11-08 DIAGNOSIS — N3041 Irradiation cystitis with hematuria: Secondary | ICD-10-CM | POA: Diagnosis not present

## 2019-11-08 NOTE — Progress Notes (Addendum)
MCGUIRE, DRESEL ([id number]) Visit Report for 11/08/2019 HBO Details Patient Name: Date of Service: KELLIS, BEARMAN 11/08/2019 1:00 PM Medical Record [id number] Patient Account Number: [id number] Date of Birth/Sex: Treating RN: Sep 23, 1956 (64 y.o. M) Primary Care Ceana Fiala: [name] Other Clinician: Mikeal Hawthorne Referring Avett Reineck: Treating Ediberto Sens/Extender:[name], [name], [name] in Treatment: 8 HBO Treatment Course Details Treatment Course Number: 1 Ordering Gurtej Noyola: [name] Total Treatments Ordered: 40 HBO Treatment Start Date: 09/19/2019 HBO Indication: Late Effect of Radiation HBO Treatment Details Treatment Number: 32 Patient Type: Outpatient Chamber Type: Monoplace Chamber Serial #: [id number] Treatment Protocol: 2.5 ATA with 90 minutes oxygen, with two 5 minute air breaks Treatment Details Compression Rate Down: 2.0 psi / minute De-Compression Rate Up: 2.0 psi / minute Air breaks and CompressTx Pressure breathing periods DecompressDecompress Begins Reached (leave unused spaces Begins Ends blank) Chamber Pressure (ATA)1 2.5 2.5 2.5 2.5 2.5 --2.5 1 Clock Time (24 hr) 13:04 13:16 13:4613:5114:2114:26--14:56 15:08 Treatment Length: 124 (minutes) Treatment Segments: 4 Vital Signs Capillary Blood Glucose Reference Range: 80 - 120 mg / dl HBO Diabetic Blood Glucose Intervention Range: <131 mg/dl or >249 mg/dl Time Vitals Blood Respiratory Capillary Blood Glucose Pulse Action Type: Pulse: Temperature: Taken: Pressure: Rate: Glucose (mg/dl): Meter #: Oximetry (%) Taken: Pre 13:05 160/88 93 17 98.1 Post 15:10 159/91 63 15 98 Treatment Response Treatment Toleration: Well Treatment Completion Treatment Completed without Adverse Event Status: Azalie Harbeck Notes No concerns with treatment given Physician HBO Attestation: I certify that I supervised this HBO treatment in accordance with Medicare guidelines. A  trained Yes Yes emergency response team is readily available per hospital policies and procedures. Continue HBOT as ordered. Yes Electronic Signature(s) Signed: 11/08/2019 5:30:44 PM By: [name] MD Previous Signature: 11/08/2019 3:29:37 PM Version By: Mikeal Hawthorne EMT/HBOT Entered By: [name] on 11/08/2019 17:28:41 -------------------------------------------------------------------------------- HBO Safety Checklist Details Patient Name: Date of Service: [name] 11/08/2019 1:00 PM Medical Record [id number] Patient Account Number: [id number] Date of Birth/Sex: Treating RN: [birth year] (64 y.o. M) Primary Care Kaleesi Guyton: [name] Other Clinician: Mikeal Hawthorne Referring Verner Kopischke: Treating Calianna Kim/Extender:[name], [name], [name] in Treatment: 8 HBO Safety Checklist Items Safety Checklist Consent Form Signed Patient voided / foley secured and emptied When did you last eato n/a Last dose of injectable or oral agent n/a NA Ostomy pouch emptied and vented if applicable NA All implantable devices assessed, documented and approved NA Intravenous access site secured and place Valuables secured Linens and cotton and cotton/polyester blend (less than 51% polyester) Personal oil-based products / skin lotions / body lotions removed NA Wigs or hairpieces removed NA Smoking or tobacco materials removed Books / newspapers / magazines / loose paper removed Cologne, aftershave, perfume and deodorant removed Jewelry removed (may wrap wedding band) NA Make-up removed Hair care products removed NA Battery operated devices (external) removed NA Heating patches and chemical warmers removed NA Titanium eyewear removed NA Nail polish cured greater than 10 hours NA Casting material cured greater than 10 hours NA Hearing aids removed NA Loose dentures or partials removed NA Prosthetics have been removed Patient demonstrates correct use of air  break device (if applicable) Patient concerns have been addressed Patient grounding bracelet on and cord attached to chamber Specifics for Inpatients (complete in addition to above) Medication sheet sent with patient Intravenous medications needed or due during therapy sent with patient Drainage tubes (e.g. nasogastric tube or chest tube secured and vented) Endotracheal or Tracheotomy tube secured Cuff deflated of air and  inflated with saline Airway suctioned Electronic Signature(s) Signed: 11/08/2019 1:13:26 PM By: Mikeal Hawthorne EMT/HBOT Entered By: Mikeal Hawthorne on 11/08/2019 13:13:26

## 2019-11-08 NOTE — Progress Notes (Signed)
ESTEPHAN, LAPIETRA ([id number]) Visit Report for 11/08/2019 Arrival Information Details Patient Name: Date of Service: JOFFRE, CABREROS 11/08/2019 1:00 PM Medical Record [id number] Patient Account Number: [id number] Date of Birth/Sex: Treating RN: 05/17/1956 (64 y.o. M) Primary Care Ramadan Couey: [name] Other Clinician: Mikeal Hawthorne Referring Karrington Mccravy: Treating Jaycie Kregel/Extender:[name], [name], [name] in Treatment: 8 Visit Information History Since Last Visit Added or deleted any medications: No Patient Arrived: Ambulatory Any new allergies or adverse reactions: No Arrival Time: 13:00 Had a fall or experienced change in No Accompanied By: self activities of daily living that may affect Transfer Assistance: None risk of falls: Patient Identification Verified: Yes Signs or symptoms of abuse/neglect since last No Secondary Verification Process Yes visito Completed: Hospitalized since last visit: No Patient Requires Transmission-Based No Implantable device outside of the clinic excluding No Precautions: cellular tissue based products placed in the center Patient Has Alerts: No since last visit: Pain Present Now: No Electronic Signature(s) Signed: 11/08/2019 3:29:37 PM By: Mikeal Hawthorne EMT/HBOT Entered By: Mikeal Hawthorne on 11/08/2019 13:12:42 -------------------------------------------------------------------------------- Encounter Discharge Information Details Patient Name: Date of Service: [name] 11/08/2019 1:00 PM Medical Record [id number] Patient Account Number: [id number] Date of Birth/Sex: Treating RN: December 26, 1955 (63 y.o. M) Primary Care Curby Carswell: [name] Other Clinician: Mikeal Hawthorne Referring Lacye Mccarn: Treating Cashae Weich/Extender:[name], [name], [name] in Treatment: 8 Encounter Discharge Information Items Discharge Condition: Stable Ambulatory Status: Ambulatory Discharge Destination:  Home Transportation: Private Auto Accompanied By: self Schedule Follow-up Appointment: Yes Clinical Summary of Care: Patient Declined Electronic Signature(s) Signed: 11/08/2019 3:29:37 PM By: Mikeal Hawthorne EMT/HBOT Entered By: Mikeal Hawthorne on 11/08/2019 15:27:38 -------------------------------------------------------------------------------- Patient/Caregiver Education Details Patient Name: Date of Service: Holster, Mordechai A. 1/5/2021andnbsp1:00 PM Medical Record [id number] Patient Account Number: [id number] Date of Birth/Gender: Treating RN: 14-Oct-1956 (63 y.o. M) Primary Care Physician: [name] Other Clinician: Mikeal Hawthorne Referring Physician: Treating Physician/Extender:[name], [name], [name] in Treatment: 8 Education Assessment Education Provided To: Patient Education Topics Provided Hyperbaric Oxygenation: Methods: Explain/Verbal Responses: State content correctly Electronic Signature(s) Signed: 11/08/2019 3:29:37 PM By: Mikeal Hawthorne EMT/HBOT Entered By: Mikeal Hawthorne on 11/08/2019 15:27:27 -------------------------------------------------------------------------------- Vitals Details Patient Name: Date of Service: [name] 11/08/2019 1:00 PM Medical Record [id number] Patient Account Number: [id number] Date of Birth/Sex: Treating RN: 04-20-1956 (63 y.o. M) Primary Care Vitalia Stough: [name] Other Clinician: Mikeal Hawthorne Referring Urvi Imes: Treating Seerat Peaden/Extender:[name], [name], [name] in Treatment: 8 Vital Signs Time Taken: 13:05 Temperature (F): 98.1 Pulse (bpm): 93 Respiratory Rate (breaths/min): 17 Blood Pressure (mmHg): 160/88 Reference Range: 80 - 120 mg / dl Electronic Signature(s) Signed: 11/08/2019 3:29:37 PM By: Mikeal Hawthorne EMT/HBOT Entered By: Mikeal Hawthorne on 11/08/2019 13:12:57

## 2019-11-08 NOTE — Progress Notes (Signed)
KAJE, FAYE ([id number]) Visit Report for 11/08/2019 SuperBill Details Patient Name: Date of Service: [name] [name], [name] [name] 11/08/2019 Medical Record [id number] Patient Account Number: [id number] Date of Birth/Sex: Treating RN: 02/26/1956 (64 y.o. 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

## 2019-11-09 ENCOUNTER — Other Ambulatory Visit: Payer: Self-pay

## 2019-11-09 ENCOUNTER — Encounter (HOSPITAL_BASED_OUTPATIENT_CLINIC_OR_DEPARTMENT_OTHER): Payer: Federal, State, Local not specified - PPO | Admitting: Physician Assistant

## 2019-11-09 DIAGNOSIS — N3041 Irradiation cystitis with hematuria: Secondary | ICD-10-CM | POA: Diagnosis not present

## 2019-11-09 NOTE — Progress Notes (Signed)
[name] [name], [name] [name] ([id number]) Visit Report for 11/09/2019 Arrival Information Details Patient Name: Date of Service: [name] [name], [name] [name] 11/09/2019 1:00 PM Medical Record [id number] Patient Account Number: [id number] Date of Birth/Sex: Treating RN: 11/19/1955 (64 y.o. M) Primary Care Taressa Rauh: [name] Other Clinician: Mikeal Hawthorne Referring Abbigaile Rockman: Treating Latanza Pfefferkorn/Extender:Stone III, Joannie Springs, [name] in Treatment: 8 Visit Information History Since Last Visit Added or deleted any medications: No Patient Arrived: Ambulatory Any new allergies or adverse reactions: No Arrival Time: 12:50 Had a fall or experienced change in No Accompanied By: self activities of daily living that may affect Transfer Assistance: None risk of falls: Patient Identification Verified: Yes Signs or symptoms of abuse/neglect since last No Secondary Verification Process Yes visito Completed: Hospitalized since last visit: No Patient Requires Transmission-Based No Implantable device outside of the clinic excluding No Precautions: cellular tissue based products placed in the center Patient Has Alerts: No since last visit: Pain Present Now: No Electronic Signature(s) Signed: 11/09/2019 3:43:47 PM By: Mikeal Hawthorne EMT/HBOT Entered By: Mikeal Hawthorne on 11/09/2019 13:50:15 -------------------------------------------------------------------------------- Encounter Discharge Information Details Patient Name: Date of Service: [name] [name] 11/09/2019 1:00 PM Medical Record [id number] Patient Account Number: [id number] Date of Birth/Sex: Treating RN: [birth year] (64 y.o. M) Primary Care Ritta Hammes: [name] Other Clinician: Mikeal Hawthorne Referring Shalva Rozycki: Treating Allien Melberg/Extender:Stone III, Joannie [place], [name] in Treatment: 8 Encounter Discharge Information Items Discharge Condition: Stable Ambulatory Status: Ambulatory Discharge Destination:  Home Transportation: Private Auto Accompanied By: self Schedule Follow-up Appointment: Yes Clinical Summary of Care: Patient Declined Electronic Signature(s) Signed: 11/09/2019 3:43:47 PM By: Mikeal Hawthorne EMT/HBOT Entered By: Mikeal Hawthorne on 11/09/2019 15:41:57 -------------------------------------------------------------------------------- Patient/Caregiver Education Details Patient Name: Date of Service: [name] [name], [name] A. 1/6/2021andnbsp1:00 PM Medical Record [id number] Patient Account Number: [id number] Date of Birth/Gender: Treating RN: 1956-09-30 (63 y.o. M) Primary Care Physician: [name] Other Clinician: Mikeal Hawthorne Referring Physician: Treating Physician/Extender:Stone III, Joannie Springs, [name] in Treatment: 8 Education Assessment Education Provided To: Patient Education Topics Provided Hyperbaric Oxygenation: Methods: Explain/Verbal Responses: State content correctly Electronic Signature(s) Signed: 11/09/2019 3:43:47 PM By: Mikeal Hawthorne EMT/HBOT Entered By: Mikeal Hawthorne on 11/09/2019 15:41:47 -------------------------------------------------------------------------------- Vitals Details Patient Name: Date of Service: [name] [name] 11/09/2019 1:00 PM Medical Record [id number] Patient Account Number: [id number] Date of Birth/Sex: Treating RN: 01/18/1956 (64 y.o. M) Primary Care Ahriyah Vannest: [name] Other Clinician: Mikeal Hawthorne Referring Juanita Streight: Treating Zafira Munos/Extender:Stone III, Joannie [place], [name] in Treatment: 8 Vital Signs Time Taken: 12:55 Temperature (F): 97.5 Pulse (bpm): 71 Respiratory Rate (breaths/min): 16 Blood Pressure (mmHg): 160/89 Reference Range: 80 - 120 mg / dl Electronic Signature(s) Signed: 11/09/2019 3:43:47 PM By: Mikeal Hawthorne EMT/HBOT Entered By: Mikeal Hawthorne on 11/09/2019 13:50:30

## 2019-11-09 NOTE — Progress Notes (Addendum)
[name] [name], [name] [name] ([id number]) Visit Report for 11/09/2019 HBO Details Patient Name: Date of Service: [name] [name], [name] [name] 11/09/2019 1:00 PM Medical Record [id number] Patient Account Number: [id number] Date of Birth/Sex: Treating RN: 12-Feb-1956 (64 y.o. M) Primary Care Abdul Beirne: [name] Other Clinician: Mikeal Hawthorne Referring Adalynd Donahoe: Treating Arlene Brickel/Extender:Stone III, Joannie Springs, [name] in Treatment: 8 HBO Treatment Course Details Treatment Course Number: 1 Ordering Chinelo Benn: [name] Total Treatments Ordered: 40 HBO Treatment Start Date: 09/19/2019 HBO Indication: Late Effect of Radiation HBO Treatment Details Treatment Number: 33 Patient Type: Outpatient Chamber Type: Monoplace Chamber Serial #: [id number] Treatment Protocol: 2.5 ATA with 90 minutes oxygen, with two 5 minute air breaks Treatment Details Compression Rate Down: 2.0 psi / minute De-Compression Rate Up: 2.0 psi / minute Air breaks and CompressTx Pressure breathing periods DecompressDecompress Begins Reached (leave unused spaces Begins Ends blank) Chamber Pressure (ATA)1 2.5 2.5 2.5 2.5 2.5 --2.5 1 Clock Time (24 hr) 13:00 13:12 13:4213:4714:1714:22--14:52 15:04 Treatment Length: 124 (minutes) Treatment Segments: 4 Vital Signs Capillary Blood Glucose Reference Range: 80 - 120 mg / dl HBO Diabetic Blood Glucose Intervention Range: <131 mg/dl or >249 mg/dl Time Vitals Blood Respiratory Capillary Blood Glucose Pulse Action Type: Pulse: Temperature: Taken: Pressure: Rate: Glucose (mg/dl): Meter #: Oximetry (%) Taken: Pre 12:55 160/89 71 16 97.5 Post 15:07 144/68 61 14 97.8 Treatment Response Treatment Toleration: Well Treatment Completion Treatment Completed without Adverse Event Status: Physician HBO Attestation: I certify that I supervised this HBO treatment in accordance with Medicare guidelines. A trained Yes emergency response team is readily available per hospital  policies and procedures. Continue HBOT as ordered. Yes Electronic Signature(s) Signed: 11/14/2019 4:27:48 PM By: [name] PA-C Previous Signature: 11/09/2019 3:43:47 PM Version By: Mikeal Hawthorne EMT/HBOT Entered By: [name] on 11/14/2019 16:27:47 -------------------------------------------------------------------------------- HBO Safety Checklist Details Patient Name: Date of Service: [name] [name], [name] [name] 11/09/2019 1:00 PM Medical Record [id number] Patient Account Number: [id number] Date of Birth/Sex: Treating RN: January 24, 1956 (64 y.o. M) Primary Care Malikah Principato: [name] Other Clinician: Mikeal Hawthorne Referring Aryam Zhan: Treating Wilmot Quevedo/Extender:Stone III, Joannie [place], [name] in Treatment: 8 HBO Safety Checklist Items Safety Checklist Consent Form Signed Patient voided / foley secured and emptied When did you last eato n/a Last dose of injectable or oral agent n/a NA Ostomy pouch emptied and vented if applicable NA All implantable devices assessed, documented and approved NA Intravenous access site secured and place Valuables secured Linens and cotton and cotton/polyester blend (less than 51% polyester) Personal oil-based products / skin lotions / body lotions removed NA Wigs or hairpieces removed NA Smoking or tobacco materials removed Books / newspapers / magazines / loose paper removed Cologne, aftershave, perfume and deodorant removed Jewelry removed (may wrap wedding band) NA Make-up removed Hair care products removed NA Battery operated devices (external) removed NA Heating patches and chemical warmers removed NA Titanium eyewear removed NA Nail polish cured greater than 10 hours NA Casting material cured greater than 10 hours NA Hearing aids removed NA Loose dentures or partials removed NA Prosthetics have been removed Patient demonstrates correct use of air break device (if applicable) Patient concerns have been addressed Patient  grounding bracelet on and cord attached to chamber Specifics for Inpatients (complete in addition to above) Medication sheet sent with patient Intravenous medications needed or due during therapy sent with patient Drainage tubes (e.g. nasogastric tube or chest tube secured and vented) Endotracheal or Tracheotomy tube secured Cuff deflated of air and inflated with saline Airway  suctioned Electronic Signature(s) Signed: 11/09/2019 1:51:06 PM By: Mikeal Hawthorne EMT/HBOT Entered By: Mikeal Hawthorne on 11/09/2019 13:51:05

## 2019-11-10 ENCOUNTER — Encounter (HOSPITAL_BASED_OUTPATIENT_CLINIC_OR_DEPARTMENT_OTHER): Payer: Federal, State, Local not specified - PPO | Admitting: Internal Medicine

## 2019-11-10 ENCOUNTER — Other Ambulatory Visit: Payer: Self-pay

## 2019-11-10 DIAGNOSIS — N3041 Irradiation cystitis with hematuria: Secondary | ICD-10-CM | POA: Diagnosis not present

## 2019-11-10 NOTE — Progress Notes (Addendum)
CASTER, WALDINGER ([id number]) Visit Report for 11/10/2019 HBO Details Patient Name: Date of Service: [name] [name], [name] [name] 11/10/2019 1:00 PM Medical Record [id number] Patient Account Number: [id number] Date of Birth/Sex: Treating RN: 10-30-1956 (64 y.o. M) Primary Care Lakrisha Iseman: [name] Other Clinician: Mikeal Hawthorne Referring Trenace Coughlin: Treating Evelyna Folker/Extender:[name], [name], [name] in Treatment: 8 HBO Treatment Course Details Treatment Course Number: 1 Ordering Charliee Krenz: [name] Total Treatments Ordered: 40 HBO Treatment Start Date: 09/19/2019 HBO Indication: Late Effect of Radiation HBO Treatment Details Treatment Number: 34 Patient Type: Outpatient Chamber Type: Monoplace Chamber Serial #: [id number] Treatment Protocol: 2.5 ATA with 90 minutes oxygen, with two 5 minute air breaks Treatment Details Compression Rate Down: 2.0 psi / minute De-Compression Rate Up: 2.0 psi / minute Air breaks and CompressTx Pressure breathing periods DecompressDecompress Begins Reached (leave unused spaces Begins Ends blank) Chamber Pressure (ATA)1 2.5 2.5 2.5 2.5 2.5 --2.5 1 Clock Time (24 hr) 12:56 13:08 [id number] 15:00 Treatment Length: 124 (minutes) Treatment Segments: 4 Vital Signs Capillary Blood Glucose Reference Range: 80 - 120 mg / dl HBO Diabetic Blood Glucose Intervention Range: <131 mg/dl or >249 mg/dl Time Vitals Blood Respiratory Capillary Blood Glucose Pulse Action Type: Pulse: Temperature: Taken: Pressure: Rate: Glucose (mg/dl): Meter #: Oximetry (%) Taken: Pre 12:55 175/93 100 16 98.2 Post 15:03 124/77 66 14 98 Treatment Response Treatment Toleration: Well Treatment Completion Treatment Completed without Adverse Event Status: Sheryl Towell Notes No concerns with treatment given Physician HBO Attestation: I certify that I supervised this HBO treatment in accordance with Medicare guidelines. A  trained Yes Yes emergency response team is readily available per hospital policies and procedures. Continue HBOT as ordered. Yes Electronic Signature(s) Signed: 11/11/2019 4:55:01 AM By: [name] MD Previous Signature: 11/10/2019 4:21:35 PM Version By: Mikeal Hawthorne EMT/HBOT Entered By: [name] on 11/10/2019 17:07:25 -------------------------------------------------------------------------------- HBO Safety Checklist Details Patient Name: Date of Service: [name] [name]. 11/10/2019 1:00 PM Medical Record [id number] Patient Account Number: [id number] Date of Birth/Sex: Treating RN: Mar 29, 1956 (64 y.o. M) Primary Care [name] [name]: [name] Other Clinician: Mikeal Hawthorne Referring [name] [name]: Treating [name] [name]/Extender:[name], [name], [name] in Treatment: 8 HBO Safety Checklist Items Safety Checklist Consent Form Signed Patient voided / foley secured and emptied When did you last eato n/a Last dose of injectable or oral agent n/a NA Ostomy pouch emptied and vented if applicable NA All implantable devices assessed, documented and approved NA Intravenous access site secured and place Valuables secured Linens and cotton and cotton/polyester blend (less than 51% polyester) Personal oil-based products / skin lotions / body lotions removed NA Wigs or hairpieces removed NA Smoking or tobacco materials removed Books / newspapers / magazines / loose paper removed Cologne, aftershave, perfume and deodorant removed Jewelry removed (may wrap wedding band) NA Make-up removed Hair care products removed NA Battery operated devices (external) removed NA Heating patches and chemical warmers removed NA Titanium eyewear removed NA Nail polish cured greater than 10 hours NA Casting material cured greater than 10 hours NA Hearing aids removed NA Loose dentures or partials removed NA Prosthetics have been removed Patient demonstrates correct use of air  break device (if applicable) Patient concerns have been addressed Patient grounding bracelet on and cord attached to chamber Specifics for Inpatients (complete in addition to above) Medication sheet sent with patient Intravenous medications needed or due during therapy sent with patient Drainage tubes (e.g. nasogastric tube or chest tube secured and vented) Endotracheal or Tracheotomy tube secured Cuff deflated of air and  inflated with saline Airway suctioned Electronic Signature(s) Signed: 11/10/2019 1:11:57 PM By: Mikeal Hawthorne EMT/HBOT Entered By: Mikeal Hawthorne on 11/10/2019 13:11:56

## 2019-11-10 NOTE — Progress Notes (Signed)
[name] [name], [name] [name] ([id number]) Visit Report for 11/10/2019 Arrival Information Details Patient Name: Date of Service: [name] [name] 11/10/2019 1:00 PM Medical Record [id number] Patient Account Number: [id number] Date of Birth/Sex: Treating RN: Feb 16, 1956 (64 y.o. M) Primary Care Dandrae Kustra: [name] Other Clinician: Mikeal Hawthorne Referring Jahdai Padovano: Treating Janayah Zavada/Extender:[name], [name], [name] in Treatment: 8 Visit Information History Since Last Visit Added or deleted any medications: No Patient Arrived: Ambulatory Any new allergies or adverse reactions: No Arrival Time: 12:50 Had a fall or experienced change in No Accompanied By: self activities of daily living that may affect Transfer Assistance: None risk of falls: Patient Identification Verified: Yes Signs or symptoms of abuse/neglect since last No Secondary Verification Process Yes visito Completed: Hospitalized since last visit: No Patient Requires Transmission-Based No Implantable device outside of the clinic excluding No Precautions: cellular tissue based products placed in the center Patient Has Alerts: No since last visit: Pain Present Now: No Electronic Signature(s) Signed: 11/10/2019 4:21:35 PM By: Mikeal Hawthorne EMT/HBOT Entered By: Mikeal Hawthorne on 11/10/2019 13:11:05 -------------------------------------------------------------------------------- Encounter Discharge Information Details Patient Name: Date of Service: [name] [name] 11/10/2019 1:00 PM Medical Record [id number] Patient Account Number: [id number] Date of Birth/Sex: Treating RN: 1956-06-01 (63 y.o. M) Primary Care Dakoda Laventure: [name] Other Clinician: Mikeal Hawthorne Referring Vence Lalor: Treating Darryle Dennie/Extender:[name], [name], [name] in Treatment: 8 Encounter Discharge Information Items Discharge Condition: Stable Ambulatory Status: Ambulatory Discharge Destination:  Home Transportation: Private Auto Accompanied By: self Schedule Follow-up Appointment: Yes Clinical Summary of Care: Patient Declined Electronic Signature(s) Signed: 11/10/2019 4:21:35 PM By: Mikeal Hawthorne EMT/HBOT Entered By: Mikeal Hawthorne on 11/10/2019 16:19:43 -------------------------------------------------------------------------------- Patient/Caregiver Education Details Patient Name: Date of Service: [name], [name] 1/7/2021andnbsp1:00 PM Medical Record [id number] Patient Account Number: [id number] Date of Birth/Gender: Treating RN: 1956/04/04 (63 y.o. M) Primary Care Physician: [name] Other Clinician: Mikeal Hawthorne Referring Physician: Treating Physician/Extender:[name], [name], [name] in Treatment: 8 Education Assessment Education Provided To: Patient Education Topics Provided Hyperbaric Oxygenation: Methods: Explain/Verbal Responses: State content correctly Electronic Signature(s) Signed: 11/10/2019 4:21:35 PM By: Mikeal Hawthorne EMT/HBOT Entered By: Mikeal Hawthorne on 11/10/2019 16:19:33 -------------------------------------------------------------------------------- Vitals Details Patient Name: Date of Service: [name] [name] 11/10/2019 1:00 PM Medical Record [id number] Patient Account Number: [id number] Date of Birth/Sex: Treating RN: 1956/04/05 (64 y.o. M) Primary Care Nyilah Kight: [name] Other Clinician: Mikeal Hawthorne Referring Aniruddh Ciavarella: Treating Sunshyne Horvath/Extender:[name], [name], [name] in Treatment: 8 Vital Signs Time Taken: 12:55 Temperature (F): 98.2 Pulse (bpm): 100 Respiratory Rate (breaths/min): 16 Blood Pressure (mmHg): 175/93 Reference Range: 80 - 120 mg / dl Electronic Signature(s) Signed: 11/10/2019 4:21:35 PM By: Mikeal Hawthorne EMT/HBOT Entered By: Mikeal Hawthorne on 11/10/2019 13:11:21

## 2019-11-11 ENCOUNTER — Encounter (HOSPITAL_BASED_OUTPATIENT_CLINIC_OR_DEPARTMENT_OTHER): Payer: Federal, State, Local not specified - PPO | Admitting: Internal Medicine

## 2019-11-11 ENCOUNTER — Other Ambulatory Visit: Payer: Self-pay

## 2019-11-11 DIAGNOSIS — N3041 Irradiation cystitis with hematuria: Secondary | ICD-10-CM | POA: Diagnosis not present

## 2019-11-11 NOTE — Progress Notes (Addendum)
KAWAN, GILLEN ([id number]) Visit Report for 11/11/2019 HBO Details Patient Name: Date of Service: [name] [name], [name] [name] 11/11/2019 1:00 PM Medical Record [id number] Patient Account Number: [id number] Date of Birth/Sex: Treating RN: [birth year] (64 y.o. M) Primary Care [name] [name]: [name] Other Clinician: Mikeal [name] Referring [name] [name]/Extender:[name] [name], [name] [name], [name] [name] in Treatment: 8 HBO Treatment Course Details Treatment Course Number: 1 Ordering [name] [name]: [name] [name] Total Treatments Ordered: 40 HBO Treatment Start Date: 09/19/2019 HBO Indication: Late Effect of Radiation HBO Treatment Details Treatment Number: 35 Patient Type: Outpatient Chamber Type: Monoplace Chamber Serial #: [id number] Treatment Protocol: 2.5 ATA with 90 minutes oxygen, with two 5 minute air breaks Treatment Details Compression Rate Down: 2.0 psi / minute De-Compression Rate Up: 2.0 psi / minute Air breaks and CompressTx Pressure breathing periods DecompressDecompress Begins Reached (leave unused spaces Begins Ends blank) Chamber Pressure (ATA)1 2.5 2.5 2.5 2.5 2.5 --2.5 1 Clock Time (24 hr) 13:00 13:12 13:4213:4714:1714:22--14:52 15:04 Treatment Length: 124 (minutes) Treatment Segments: 4 Vital Signs Capillary Blood Glucose Reference Range: 80 - 120 mg / dl HBO Diabetic Blood Glucose Intervention Range: <131 mg/dl or >249 mg/dl Time Vitals Blood Respiratory Capillary Blood Glucose Pulse Action Type: Pulse: Temperature: Taken: Pressure: Rate: Glucose (mg/dl): Meter #: Oximetry (%) Taken: Pre 13:00 157/91 101 16 98 Post 15:07 142/77 66 14 98.2 Treatment Response Treatment Toleration: Well Treatment Completion Treatment Completed without Adverse Event Status: [name] Notes No concerns or treatment given. Patient was interviewed. He has only had one brief episode of hematuria in the last 2 weeks and that was while coughing vigorously. He has had  no gross hematuria, no blood clots. Feels his urinary frequency is improved. He does not see urology again till the beginning of February Physician HBO Attestation: I certify that I supervised this HBO treatment in accordance with Medicare guidelines. A trained Yes emergency response team is readily available per hospital policies and procedures. Continue HBOT as ordered. Yes Electronic Signature(s) Signed: 11/11/2019 4:35:49 PM By: [name] Ham MD Previous Signature: 11/11/2019 3:20:21 PM Version By: [name] [name] EMT/HBOT Entered By: [name] [name] on 11/11/2019 15:51:14 -------------------------------------------------------------------------------- HBO Safety Checklist Details Patient Name: Date of Service: [name] [name]. 11/11/2019 1:00 PM Medical Record [id number] Patient Account Number: [id number] Date of Birth/Sex: Treating RN: [name], 1956 (64 y.o. M) Primary Care Rayden Scheper: [name] Other Clinician: Mikeal [name] Referring [name] [name]: Treating [name] [name]/Extender:[name] [name], [name] [name], [name] [name] in Treatment: 8 HBO Safety Checklist Items Safety Checklist Consent Form Signed Patient voided / foley secured and emptied When did you last eato n/a Last dose of injectable or oral agent n/a NA Ostomy pouch emptied and vented if applicable NA All implantable devices assessed, documented and approved NA Intravenous access site secured and place Valuables secured Linens and cotton and cotton/polyester blend (less than 51% polyester) Personal oil-based products / skin lotions / body lotions removed NA Wigs or hairpieces removed NA Smoking or tobacco materials removed Books / newspapers / magazines / loose paper removed Cologne, aftershave, perfume and deodorant removed Jewelry removed (may wrap wedding band) NA Make-up removed Hair care products removed NA Battery operated devices (external) removed NA Heating patches and chemical warmers removed NA  Titanium eyewear removed NA Nail polish cured greater than 10 hours NA Casting material cured greater than 10 hours NA Hearing aids removed NA Loose dentures or partials removed NA Prosthetics have been removed Patient demonstrates correct use of air break device (if applicable) Patient concerns have been addressed Patient grounding bracelet on and  cord attached to chamber Specifics for Inpatients (complete in addition to above) Medication sheet sent with patient Intravenous medications needed or due during therapy sent with patient Drainage tubes (e.g. nasogastric tube or chest tube secured and vented) Endotracheal or Tracheotomy tube secured Cuff deflated of air and inflated with saline Airway suctioned Electronic Signature(s) Signed: 11/11/2019 1:20:12 PM By: [name] [name] EMT/HBOT Entered By: [name] [name] on 11/11/2019 13:20:12

## 2019-11-11 NOTE — Progress Notes (Signed)
LEMONTE, KULT ([id number]) Visit Report for 11/10/2019 SuperBill Details Patient Name: Date of Service: [name] [name], [name] [name] 11/10/2019 Medical Record [id number] Patient Account Number: [id number] Date of Birth/Sex: Treating RN: 02-12-1956 (64 y.o. 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

## 2019-11-11 NOTE — Progress Notes (Signed)
DEREX, GARFIAS ([id number]) Visit Report for 11/11/2019 SuperBill Details Patient Name: Date of Service: [name] [name], [name] [name] 11/11/2019 Medical Record [id number] Patient Account Number: [id number] Date of Birth/Sex: Treating RN: 1955/12/04 (64 y.o. 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

## 2019-11-11 NOTE — Progress Notes (Addendum)
[name] [name], [name] [name] ([id number]) Visit Report for 11/11/2019 Arrival Information Details Patient Name: Date of Service: [name] [name], [name] [name] 11/11/2019 3:00 PM Medical Record [id number] Patient Account Number: [id number] Date of Birth/Sex: Treating RN: [birth year] (65 y.o. [name] [name] Primary Care Darothy Courtright: [name] Other Clinician: Referring Aaliyah Gavel: Treating Oshen Wlodarczyk/Extender:[name], [name], [name] in Treatment: 8 Visit Information History Since Last Visit Pain Present Now: No Patient Arrived: Ambulatory Arrival Time: 13:18 Accompanied By: self Transfer Assistance: None Patient Identification Verified: Yes Secondary Verification Process Yes Completed: Patient Requires Transmission-Based No Precautions: Patient Has Alerts: No Electronic Signature(s) Signed: 11/11/2019 2:58:22 PM By: Kela Millin Entered By: Kela Millin on 11/11/2019 13:18:44 -------------------------------------------------------------------------------- Clinic Level of Care Assessment Details Patient Name: Date of Service: [name] [name], [name] [name] 11/11/2019 3:00 PM Medical Record [id number] Patient Account Number: [id number] Date of Birth/Sex: Treating RN: 1956/08/31 (64 y.o. [name] [name] Primary Care Bela Bonaparte: [name] Other Clinician: Referring Carlita Whitcomb: Treating Armonie Staten/Extender:[name], [name], [name] in Treatment: 8 Clinic Level of Care Assessment Items TOOL 4 Quantity Score X - Use when only an EandM is performed on FOLLOW-UP visit 1 0 ASSESSMENTS - Nursing Assessment / Reassessment X - Reassessment of Co-morbidities (includes updates in patient status) 1 10 X - Reassessment of Adherence to Treatment Plan 1 5 ASSESSMENTS - Wound and Skin Assessment / Reassessment []  - Simple Wound Assessment / Reassessment - one wound 0 []  - Complex Wound Assessment / Reassessment - multiple wounds 0 []  - Dermatologic / Skin Assessment  (not related to wound area) 0 ASSESSMENTS - Focused Assessment []  - Circumferential Edema Measurements - multi extremities 0 []  - Nutritional Assessment / Counseling / Intervention 0 []  - Lower Extremity Assessment (monofilament, tuning fork, pulses) 0 []  - Peripheral Arterial Disease Assessment (using hand held doppler) 0 ASSESSMENTS - Ostomy and/or Continence Assessment and Care []  - Incontinence Assessment and Management 0 []  - Ostomy Care Assessment and Management (repouching, etc.) 0 PROCESS - Coordination of Care X - Simple Patient / Family Education for ongoing care 1 15 []  - Complex (extensive) Patient / Family Education for ongoing care 0 X - Staff obtains Programmer, systems, Records, Test Results / Process Orders 1 10 []  - Staff telephones HHA, Nursing Homes / Clarify orders / etc 0 []  - Routine Transfer to another Facility (non-emergent condition) 0 []  - Routine Hospital Admission (non-emergent condition) 0 []  - New Admissions / Biomedical engineer / Ordering NPWT, Apligraf, etc. 0 []  - Emergency Hospital Admission (emergent condition) 0 []  - Simple Discharge Coordination 0 []  - Complex (extensive) Discharge Coordination 0 PROCESS - Special Needs []  - Pediatric / Minor Patient Management 0 []  - Isolation Patient Management 0 []  - Hearing / Language / Visual special needs 0 []  - Assessment of Community assistance (transportation, D/C planning, etc.) 0 []  - Additional assistance / Altered mentation 0 []  - Support Surface(s) Assessment (bed, cushion, seat, etc.) 0 INTERVENTIONS - Wound Cleansing / Measurement []  - Simple Wound Cleansing - one wound 0 []  - Complex Wound Cleansing - multiple wounds 0 []  - Wound Imaging (photographs - any number of wounds) 0 []  - Wound Tracing (instead of photographs) 0 []  - Simple Wound Measurement - one wound 0 []  - Complex Wound Measurement - multiple wounds 0 INTERVENTIONS - Wound Dressings []  - Small Wound Dressing one or multiple wounds 0 []   - Medium Wound Dressing one or multiple wounds 0 []  - Large Wound Dressing one or multiple wounds 0 []  - Application of Medications -  topical 0 []  - Application of Medications - injection 0 INTERVENTIONS - Miscellaneous []  - External ear exam 0 []  - Specimen Collection (cultures, biopsies, blood, body fluids, etc.) 0 []  - Specimen(s) / Culture(s) sent or taken to Lab for analysis 0 []  - Patient Transfer (multiple staff / Harrel Lemon Lift / Similar devices) 0 []  - Simple Staple / Suture removal (25 or less) 0 []  - Complex Staple / Suture removal (26 or more) 0 []  - Hypo / Hyperglycemic Management (close monitor of Blood Glucose) 0 []  - Ankle / Brachial Index (ABI) - do not check if billed separately 0 X - Vital Signs 1 5 Has the patient been seen at the hospital within the last three years: Yes Total Score: 45 Level Of Care: New/Established - Level 2 Electronic Signature(s) Signed: 11/11/2019 2:58:22 PM By: Kela Millin Entered By: Kela Millin on 11/11/2019 13:19:58 -------------------------------------------------------------------------------- [place] Details Patient Name: Date of Service: [name] [name]. 11/11/2019 3:00 PM Medical Record [id number] Patient Account Number: [id number] Date of Birth/Sex: Treating RN: 1956/05/28 (64 y.o. [name] Primary Care Larri Brewton: [name] Other Clinician: Referring Kolbi Altadonna: Treating Chasty Randal/Extender:[name], [name], [name] in Treatment: 8 Active Inactive HBO Nursing Diagnoses: Potential for barotraumas to ears, sinuses, teeth, and lungs or cerebral gas embolism related to changes in atmospheric pressure inside hyperbaric oxygen chamber Potential for oxygen toxicity seizures related to delivery of 100% oxygen at an increased atmospheric pressure Potential for pulmonary oxygen toxicity related to delivery of 100% oxygen at an increased atmospheric pressure Goals: Barotrauma  will be prevented during HBO2 Date Initiated: 09/13/2019 Target Resolution Date: 10/14/2019 Goal Status: Active Patient and/or family will be able to state/discuss factors appropriate to the management of their disease process during treatment Date Initiated: 09/13/2019 Target Resolution Date: 10/14/2019 Goal Status: Active Patient will tolerate the hyperbaric oxygen therapy treatment Date Initiated: 09/13/2019 Target Resolution Date: 10/14/2019 Goal Status: Active Patient will tolerate the internal climate of the chamber Date Initiated: 09/13/2019 Target Resolution Date: 10/14/2019 Goal Status: Active Interventions: Administer decongestants, per physician orders, prior to HBO2 Administer the correct therapeutic gas delivery based on the patients needs and limitations, per physician order Assess and provide for patients comfort related to the hyperbaric environment and equalization of middle ear Assess for signs and symptoms related to adverse events, including but not limited to confinement anxiety, pneumothorax, oxygen toxicity and baurotrauma Assess patient for any history of confinement anxiety Assess patient's knowledge and expectations regarding hyperbaric medicine and provide education related to the hyperbaric environment, goals of treatment and prevention of adverse events Notes: Electronic Signature(s) Signed: 12/29/2019 2:23:51 PM By: Levan Hurst RN, BSN Entered By: Levan Hurst on 12/16/2019 08:53:28 -------------------------------------------------------------------------------- Pain Assessment Details Patient Name: Date of Service: [name] [name] 11/11/2019 3:00 PM Medical Record [id number] Patient Account Number: [id number] Date of Birth/Sex: Treating RN: [birth year] (64 y.o. [name] [name] Primary Care Gentle Hoge: [name] Other Clinician: Referring Anda Sobotta: Treating Masoud Nyce/Extender:[name], [name], [name] in  Treatment: 8 Active Problems Location of Pain Severity and Description of Pain Patient Has Paino No Site Locations Pain Management and Medication Current Pain Management: Electronic Signature(s) Signed: 11/11/2019 2:58:22 PM By: Kela Millin Entered By: Kela Millin on 11/11/2019 13:19:15 -------------------------------------------------------------------------------- Vitals Details Patient Name: Date of Service: [name] [name]. 11/11/2019 3:00 PM Medical Record [id number] Patient Account Number: [id number] Date of Birth/Sex: Treating RN: Feb 22, 1956 (64 y.o. [name] [name] Primary Care Mry Lamia: [name] Other Clinician: Referring Argil Mahl: Treating Annalei Friesz/Extender:[name], Legrand Como  [name] Weeks in Treatment: 8 Vital Signs Time Taken: 13:15 Temperature (F): 97.6 Pulse (bpm): 101 Respiratory Rate (breaths/min): 16 Blood Pressure (mmHg): 157/91 Reference Range: 80 - 120 mg / dl Electronic Signature(s) Signed: 11/11/2019 2:58:22 PM By: Kela Millin Entered By: Kela Millin on 11/11/2019 13:19:09

## 2019-11-11 NOTE — Progress Notes (Addendum)
GURSHAAN, SNELLEN ([id number]) Visit Report for 11/11/2019 HPI Details Patient Name: Date of Service: [name] [name], [name] [name] 11/11/2019 3:00 PM Medical Record [id number] Patient Account Number: [id number] Date of Birth/Sex: Treating RN: [birth year] (64 y.o. [name] Primary Care Provider: [name] Other Clinician: Referring Provider: Treating Provider/Extender:Kaydi Kley, [name], [name] in Treatment: 8 History of Present Illness HPI Description: ADMISSION 09/13/2019 This is a 64 year old man who was treated with robotic radical prostatectomy and lymphadenectomy 07/2014 for T2cN0Mx Gleason grade 7 adenocarcinoma. In 2017 he was treated for prostate cancer recurrence based I believe on an elevated PSA. He was treated with 68 Gy to the prostate fossa from 12/11/2015 through 01/31/2016. He had 38 fractions of treatment. He has had operative cystoscopy and clot evacuation x2 on 10/2 and 10/18 with radiation cystitis underwent fulguration. He was most recently seen by his urologist Dr. 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 He is on Lupron. The patient on 11/5 reported burning pain with urination, hesitancy straining. He was referred here for consideration of hyperbaric oxygen he is continuing on Lupron. Fortunately his disease appears to be under good control. The patient tells me that his bleeding towards the end of September. He has had 2 cystoscopies in March and bladder fulguration. He has had almost continuous gross hematuria with large clots that episodically make it difficult for him to void. He is not describing dysuria other than when he has a clot obstructing his urethra. He is describing some degree of urgency and hesitancy probably related to again clots making it difficult for him to void. Interestingly the patient's hemoglobin has really plummeted from 15.2 on 9/26-7.5 last night. He was  typed and crossed but was not transfused when he was in the ER. The patient was in the emergency room last night with inability to void. He had a Foley catheter placed. He now has a leg bag. Lab work as noted showed a hemoglobin of 7.5 white count of 10 sodium of 129 BUN is 16 and a creatinine slightly elevated from his baseline at 1.11 patient states he has been feeling very weak and tired not his self at all. Past medical history; hypertension. He is not a smoker. Has no cardiac or pulmonary issues 1/8 No concerns or treatment given. Patient was interviewed. He has only had one brief episode of hematuria in the last 2 weeks and that was while coughing vigorously. He has had no gross hematuria, no blood clots. Feels his urinary frequency is improved. He does not see urology again till the beginning of February Electronic Signature(s) Signed: 12/27/2019 7:51:02 AM By: [name] MD Signed: 12/29/2019 2:23:51 PM By: Levan Hurst RN, BSN Entered By: Levan Hurst on 12/16/2019 08:52:10 -------------------------------------------------------------------------------- Physician Orders Details Patient Name: Date of Service: [name] [name], [name] [name] 11/11/2019 3:00 PM Medical Record [id number] Patient Account Number: [id number] Date of Birth/Sex: Treating RN: [birth year] (64 y.o. [name] Primary Care Provider: [name] Other Clinician: Referring Provider: Treating Provider/Extender:Adis Sturgill, [name], [name] in Treatment: 8 Verbal / Phone Orders: No Diagnosis Coding ICD-10 Coding Code Description N30.41 Irradiation cystitis with hematuria R31.0 Gross hematuria Z85.46 Personal history of malignant neoplasm of prostate Hyperbaric Oxygen Therapy Evaluate for HBO Therapy Indication: - radiation cystitis If appropriate for treatment, begin HBOT per protocol: 2.5 ATA for 90 Minutes with 2 Five (5) Minute Air Breaks Total Number of Treatments: - 40 One  treatments per  day (delivered Monday through Friday unless otherwise specified in Special Instructions below): Antihistamine 30 minutes prior to HBO Treatment, difficulty clearing ears. Electronic Signature(s) Signed: 12/27/2019 7:51:02 AM By: [name] MD Signed: 12/29/2019 2:23:51 PM By: Levan Hurst RN, BSN Entered By: Levan Hurst on 12/16/2019 08:52:56 -------------------------------------------------------------------------------- Problem List Details Patient Name: Date of Service: [name] [name] 11/11/2019 3:00 PM Medical Record [id number] Patient Account Number: [id number] Date of Birth/Sex: Treating RN: 1956-09-28 (64 y.o. [name] Primary Care Provider: [name] Other Clinician: Referring Provider: Treating Provider/Extender:Jaala Bohle, [name], [name] in Treatment: 8 Active Problems ICD-10 Evaluated Encounter Code Description Active Date Today Diagnosis N30.41 Irradiation cystitis with hematuria 09/13/2019 No Yes R31.0 Gross hematuria 09/13/2019 No Yes Z85.46 Personal history of malignant neoplasm of prostate 09/13/2019 No Yes Inactive Problems Resolved Problems Electronic Signature(s) Signed: 12/27/2019 7:51:02 AM By: [name] MD Signed: 12/29/2019 2:23:51 PM By: Levan Hurst RN, BSN Entered By: Levan Hurst on 12/16/2019 08:52:17 -------------------------------------------------------------------------------- Progress Note Details Patient Name: Date of Service: [name] [name] 11/11/2019 3:00 PM Medical Record [id number] Patient Account Number: [id number] Date of Birth/Sex: Treating RN: Dec 01, 1955 (64 y.o. [name] Primary Care Provider: [name] Other Clinician: Referring Provider: Treating Provider/Extender:Pearly Bartosik, [name], [name] in Treatment: 8 Subjective History of Present Illness (HPI) ADMISSION 09/13/2019 This is a 64 year old man who was treated with  robotic radical prostatectomy and lymphadenectomy 07/2014 for T2cN0Mx Gleason grade 7 adenocarcinoma. In 2017 he was treated for prostate cancer recurrence based I believe on an elevated PSA. He was treated with 68 Gy to the prostate fossa from 12/11/2015 through 01/31/2016. He had 38 fractions of treatment. He has had operative cystoscopy and clot evacuation x2 on 10/2 and 10/18 with radiation cystitis underwent fulguration. He was most recently seen by his urologist Dr. 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 He is on Lupron. The patient on 11/5 reported burning pain with urination, hesitancy straining. He was referred here for consideration of hyperbaric oxygen he is continuing on Lupron. Fortunately his disease appears to be under good control. The patient tells me that his bleeding towards the end of September. He has had 2 cystoscopies in March and bladder fulguration. He has had almost continuous gross hematuria with large clots that episodically make it difficult for him to void. He is not describing dysuria other than when he has a clot obstructing his urethra. He is describing some degree of urgency and hesitancy probably related to again clots making it difficult for him to void. Interestingly the patient's hemoglobin has really plummeted from 15.2 on 9/26-7.5 last night. He was typed and crossed but was not transfused when he was in the ER. The patient was in the emergency room last night with inability to void. He had a Foley catheter placed. He now has a leg bag. Lab work as noted showed a hemoglobin of 7.5 white count of 10 sodium of 129 BUN is 16 and a creatinine slightly elevated from his baseline at 1.11 patient states he has been feeling very weak and tired not his self at all. Past medical history; hypertension. He is not a smoker. Has no cardiac or pulmonary issues 1/8 No concerns or treatment given. Patient was  interviewed. He has only had one brief episode of hematuria in the last 2 weeks and that was while coughing vigorously. He has had no gross hematuria, no blood clots. Feels his  urinary frequency is improved. He does not see urology again till the beginning of February Objective Constitutional Vitals Time Taken: 1:15 PM, Temperature: 97.6 F, Pulse: 101 bpm, Respiratory Rate: 16 breaths/min, Blood Pressure: 157/91 mmHg. Assessment Active Problems ICD-10 Irradiation cystitis with hematuria Gross hematuria Personal history of malignant neoplasm of prostate Plan Hyperbaric Oxygen Therapy: Evaluate for HBO Therapy Indication: - radiation cystitis If appropriate for treatment, begin HBOT per protocol: 2.5 ATA for 90 Minutes with 2 Five (5) Minute Air Breaks Total Number of Treatments: - 40 One treatments per day (delivered Monday through Friday unless otherwise specified in Special Instructions below): Antihistamine 30 minutes prior to HBO Treatment, difficulty clearing ears. Electronic Signature(s) Signed: 12/27/2019 7:51:02 AM By: [name] MD Signed: 12/29/2019 2:23:51 PM By: Levan Hurst RN, BSN Entered By: Levan Hurst on 12/16/2019 08:53:45 -------------------------------------------------------------------------------- [place] Details Patient Name: Date of Service: [name] [name] 11/11/2019 Medical Record [id number] Patient Account Number: [id number] Date of Birth/Sex: Treating RN: 08-Aug-1956 (64 y.o. Marvis Repress Primary Care Provider: [name] Other Clinician: Referring Provider: Treating Provider/Extender:Nikaela Coyne, [name], [name] in Treatment: 8 Diagnosis Coding ICD-10 Codes Code Description N30.41 Irradiation cystitis with hematuria R31.0 Gross hematuria Z85.46 Personal history of malignant neoplasm of prostate Facility Procedures CPT4 Code: [id number] Description: [id number] - WOUND CARE VISIT-LEV 2 EST  PT Modifier: Quantity: 1 Electronic Signature(s) Signed: 11/11/2019 2:58:22 PM By: Kela Millin Signed: 11/11/2019 4:35:49 PM By: [name] MD Entered By: Kela Millin on 11/11/2019 13:20:17

## 2019-11-11 NOTE — Progress Notes (Signed)
MARAT, WENGEL ([id number]) Visit Report for 11/11/2019 Arrival Information Details Patient Name: Date of Service: [name] [name], [name] [name] 11/11/2019 1:00 PM Medical Record [id number] Patient Account Number: [id number] Date of Birth/Sex: Treating RN: 23-Dec-1955 (64 y.o. M) Primary Care Avory Mimbs: [name] Other Clinician: Mikeal Hawthorne Referring Everlee Quakenbush: Treating Anacristina Steffek/Extender:[name], [name], [name] in Treatment: 8 Visit Information History Since Last Visit Added or deleted any medications: No Patient Arrived: Ambulatory Any new allergies or adverse reactions: No Arrival Time: 12:55 Had a fall or experienced change in No Accompanied By: self activities of daily living that may affect Transfer Assistance: None risk of falls: Patient Identification Verified: Yes Signs or symptoms of abuse/neglect since last No Secondary Verification Process Yes visito Completed: Hospitalized since last visit: No Patient Requires Transmission-Based No Implantable device outside of the clinic excluding No Precautions: cellular tissue based products placed in the center Patient Has Alerts: No since last visit: Pain Present Now: No Electronic Signature(s) Signed: 11/11/2019 3:20:21 PM By: Mikeal Hawthorne EMT/HBOT Entered By: Mikeal Hawthorne on 11/11/2019 13:19:12 -------------------------------------------------------------------------------- Encounter Discharge Information Details Patient Name: Date of Service: [name] [name] 11/11/2019 1:00 PM Medical Record [id number] Patient Account Number: [id number] Date of Birth/Sex: Treating RN: 02-08-1956 (63 y.o. M) Primary Care Samil Mecham: [name] Other Clinician: Mikeal Hawthorne Referring Janda Cargo: Treating Merelyn Klump/Extender:[name], [name], [name] in Treatment: 8 Encounter Discharge Information Items Discharge Condition: Stable Ambulatory Status: Ambulatory Discharge Destination:  Home Transportation: Private Auto Accompanied By: self Schedule Follow-up Appointment: Yes Clinical Summary of Care: Patient Declined Electronic Signature(s) Signed: 11/11/2019 3:20:21 PM By: Mikeal Hawthorne EMT/HBOT Entered By: Mikeal Hawthorne on 11/11/2019 15:18:29 -------------------------------------------------------------------------------- Patient/Caregiver Education Details Patient Name: Date of Service: [name] [name], [name] A. 1/8/2021andnbsp1:00 PM Medical Record [id number] Patient Account Number: [id number] Date of Birth/Gender: Treating RN: 1956-01-25 (63 y.o. M) Primary Care Physician: [name] Other Clinician: Mikeal Hawthorne Referring Physician: Treating Physician/Extender:[name], [name], [name] in Treatment: 8 Education Assessment Education Provided To: Patient Education Topics Provided Hyperbaric Oxygenation: Methods: Explain/Verbal Responses: State content correctly Electronic Signature(s) Signed: 11/11/2019 3:20:21 PM By: Mikeal Hawthorne EMT/HBOT Entered By: Mikeal Hawthorne on 11/11/2019 15:18:19 -------------------------------------------------------------------------------- Vitals Details Patient Name: Date of Service: [name] [name] 11/11/2019 1:00 PM Medical Record [id number] Patient Account Number: [id number] Date of Birth/Sex: Treating RN: [birth year] (64 y.o. M) Primary Care Tamecia Mcdougald: [name] Other Clinician: Mikeal Hawthorne Referring Arista Kettlewell: Treating Karolyna Bianchini/Extender:[name], [name], [name] in Treatment: 8 Vital Signs Time Taken: 13:00 Temperature (F): 98 Pulse (bpm): 101 Respiratory Rate (breaths/min): 16 Blood Pressure (mmHg): 157/91 Reference Range: 80 - 120 mg / dl Electronic Signature(s) Signed: 11/11/2019 3:20:21 PM By: Mikeal Hawthorne EMT/HBOT Entered By: Mikeal Hawthorne on 11/11/2019 13:19:33

## 2019-11-14 ENCOUNTER — Other Ambulatory Visit: Payer: Self-pay

## 2019-11-14 ENCOUNTER — Encounter (HOSPITAL_BASED_OUTPATIENT_CLINIC_OR_DEPARTMENT_OTHER): Payer: Federal, State, Local not specified - PPO | Admitting: Internal Medicine

## 2019-11-14 DIAGNOSIS — N3041 Irradiation cystitis with hematuria: Secondary | ICD-10-CM | POA: Diagnosis not present

## 2019-11-14 NOTE — Progress Notes (Signed)
[name], [name] ([id number]) Visit Report for 11/09/2019 Problem List Details Patient Name: Date of Service: [name] [name], [name] [name] 11/09/2019 1:00 PM Medical Record [id number] Patient Account Number: [id number] Date of Birth/Sex: Treating RN: [birth year] (64 y.o. M) Primary Care Provider: [name] Other Clinician: Referring Provider: Treating Provider/Extender:Stone III, Joannie [place], Madelon Lips Weeks in Treatment: 8 Active Problems ICD-10 Evaluated Encounter Code Description Active Date Today Diagnosis N30.41 Irradiation cystitis with hematuria 09/13/2019 No Yes R31.0 Gross hematuria 09/13/2019 No Yes Z85.46 Personal history of malignant neoplasm of prostate 09/13/2019 No Yes Inactive Problems Resolved Problems Electronic Signature(s) Signed: 11/14/2019 4:27:57 PM By: [name] PA-C Entered By: [name] on 11/14/2019 16:27:57 -------------------------------------------------------------------------------- [place] Details Patient Name: Date of Service: [name] [name] 11/09/2019 Medical Record [id number] Patient Account Number: [id number] Date of Birth/Sex: Treating RN: [birth year] (63 y.o. M) Primary Care Provider: [name] Other Clinician: Mikeal Hawthorne Referring Provider: Treating Provider/Extender:Stone III, Joannie [place], [name] in Treatment: 8 Diagnosis Coding ICD-10 Codes Code Description N30.41 Irradiation cystitis with hematuria R31.0 Gross hematuria Z85.46 Personal history of malignant neoplasm of prostate Facility Procedures CPT4 Code Description: [id number] G0277-(Facility Use Only) HBOT, full body chamber, 51min Modifier: Quantity: 4 Physician Procedures CPT4 Code Description: [id number] - WC PHYS HYPERBARIC OXYGEN THERAPY ICD-10 Diagnosis Description N30.41 Irradiation cystitis with hematuria Modifier: Quantity: 1 Electronic Signature(s) Signed: 11/14/2019 4:27:54 PM By: [name] PA-C Previous  Signature: 11/09/2019 3:43:47 PM Version By: Mikeal Hawthorne EMT/HBOT Entered By: [name] on 11/14/2019 16:27:53

## 2019-11-14 NOTE — Progress Notes (Signed)
HET, HELFERICH ([id number]) Visit Report for 11/14/2019 Arrival Information Details Patient Name: Date of Service: [name] [name], [name] [name] 11/14/2019 1:00 PM Medical Record [id number] Patient Account Number: [id number] Date of Birth/Sex: Treating RN: 04/24/1956 (65 y.o. M) Primary Care [name] [name]: [name] [name] Other Clinician: Mikeal [name] Referring [name] [name]: Treating [name] [name]/Extender:[name] [name], [name] [name] in Treatment: 8 Visit Information History Since Last Visit Added or deleted any medications: No Patient Arrived: Ambulatory Any new allergies or adverse reactions: No Arrival Time: 13:05 Had a fall or experienced change in No Accompanied By: self activities of daily living that may affect Transfer Assistance: None risk of falls: Patient Identification Verified: Yes Signs or symptoms of abuse/neglect since last No Secondary Verification Process Yes visito Completed: Hospitalized since last visit: No Patient Requires Transmission-Based No Implantable device outside of the clinic excluding No Precautions: cellular tissue based products placed in the center Patient Has Alerts: No since last visit: Pain Present Now: No Electronic Signature(s) Signed: 11/14/2019 4:37:37 PM By: [name] [name] EMT/HBOT Entered By: [name] [name] on 11/14/2019 13:30:16 -------------------------------------------------------------------------------- Encounter Discharge Information Details Patient Name: Date of Service: [name] Baptise A. 11/14/2019 1:00 PM Medical Record [id number] Patient Account Number: [id number] Date of Birth/Sex: Treating RN: 1956-01-28 (64 y.o. M) Primary Care [name] [name]: [name] [name] Other Clinician: Mikeal [name] Referring [name] [name]: Treating [name] [name]/Extender:[name] [name], [name] [name] in Treatment: 8 Encounter Discharge Information Items Discharge Condition: Stable Ambulatory Status: Ambulatory Discharge Destination:  Home Transportation: Private Auto Accompanied By: self Schedule Follow-up Appointment: Yes Clinical Summary of Care: Patient Declined Electronic Signature(s) Signed: 11/14/2019 4:37:37 PM By: [name] [name] EMT/HBOT Entered By: [name] [name] on 11/14/2019 16:26:12 -------------------------------------------------------------------------------- Patient/Caregiver Education Details Patient Name: Date of Service: [name], [name] 1/11/2021andnbsp1:00 PM Medical Record [id number] Patient Account Number: [id number] Date of Birth/Gender: Treating RN: [birth year] (63 y.o. M) Primary Care Physician: [name] [name] Other Clinician: Mikeal [name] Referring Physician: Treating Physician/Extender:[name] [name], [name] [name] in Treatment: 8 Education Assessment Education Provided To: Patient Education Topics Provided Hyperbaric Oxygenation: Methods: Explain/Verbal Responses: State content correctly Electronic Signature(s) Signed: 11/14/2019 4:37:37 PM By: [name] [name] EMT/HBOT Entered By: [name] [name] on 11/14/2019 16:26:01 -------------------------------------------------------------------------------- Vitals Details Patient Name: Date of Service: [name] Baptise A. 11/14/2019 1:00 PM Medical Record [id number] Patient Account Number: [id number] Date of Birth/Sex: Treating RN: 1956-01-05 (64 y.o. M) Primary Care [name] [name]: [name] [name] Other Clinician: Mikeal [name] Referring [name] [name]: Treating [name] [name]/Extender:[name] [name], [name] [name] in Treatment: 8 Vital Signs Time Taken: 13:10 Temperature (F): 97.6 Pulse (bpm): 71 Respiratory Rate (breaths/min): 15 Blood Pressure (mmHg): 140/84 Reference Range: 80 - 120 mg / dl Electronic Signature(s) Signed: 11/14/2019 4:37:37 PM By: [name] [name] EMT/HBOT Entered By: [name] [name] on 11/14/2019 13:30:32

## 2019-11-14 NOTE — Progress Notes (Signed)
TREAVOR, UN ([id number]) Visit Report for 11/14/2019 [place] Details Patient Name: Date of Service: [name] [name], [name] [name] 11/14/2019 Medical Record [id number] Patient Account Number: [id number] Date of Birth/Sex: Treating RN: 29-Mar-1956 (64 y.o. M) Primary Care Provider: [name] Other Clinician: Mikeal Hawthorne Referring Provider: Treating Provider/Extender:Nicol Herbig, [name], [name] in Treatment: 8 Diagnosis Coding ICD-10 Codes Code Description N30.41 Irradiation cystitis with hematuria R31.0 Gross hematuria Z85.46 Personal history of malignant neoplasm of prostate Facility Procedures CPT4 Code Description Modifier Quantity [id number] G0277-(Facility Use Only) HBOT, full body chamber, 51min 4 Physician Procedures CPT4 Code Description Modifier Quantity [id number] - WC PHYS HYPERBARIC OXYGEN THERAPY 1 ICD-10 Diagnosis Description N30.41 Irradiation cystitis with hematuria Electronic Signature(s) Signed: 11/14/2019 4:37:37 PM By: Mikeal Hawthorne EMT/HBOT Signed: 11/14/2019 6:04:26 PM By: [name] MD Entered By: Mikeal Hawthorne on 11/14/2019 16:25:47

## 2019-11-14 NOTE — Progress Notes (Addendum)
TORELL, [name] ([id number]) Visit Report for 11/14/2019 HBO Details Patient Name: Date of Service: [name] [name], [name] [name] 11/14/2019 1:00 PM Medical Record [id number] Patient Account Number: [id number] Date of Birth/Sex: Treating RN: [birth year] (64 y.o. M) Primary Care Liv Rallis: [name] Other Clinician: Mikeal Hawthorne Referring Kenon Delashmit: Treating Yaniyah Koors/Extender:[name], [name], [name] in Treatment: 8 HBO Treatment Course Details Treatment Course Number: 1 Ordering Loreena Valeri: [name] Total Treatments Ordered: 40 HBO Treatment Start Date: 09/19/2019 HBO Indication: Late Effect of Radiation HBO Treatment Details Treatment Number: 36 Patient Type: Outpatient Chamber Type: Monoplace Chamber Serial #: [id number] Treatment Protocol: 2.5 ATA with 90 minutes oxygen, with two 5 minute air breaks Treatment Details Compression Rate Down: 2.0 psi / minute De-Compression Rate Up: 2.0 psi / minute Air breaks and CompressTx Pressure breathing periods DecompressDecompress Begins Reached (leave unused spaces Begins Ends blank) Chamber Pressure (ATA)1 2.5 2.5 2.5 2.5 2.5 --2.5 1 Clock Time (24 hr) 13:08 13:20 13:5013:5514:2514:30--15:00 15:12 Treatment Length: 124 (minutes) Treatment Segments: 4 Vital Signs Capillary Blood Glucose Reference Range: 80 - 120 mg / dl HBO Diabetic Blood Glucose Intervention Range: <131 mg/dl or >249 mg/dl Time Vitals Blood Respiratory Capillary Blood Glucose Pulse Action Type: Pulse: Temperature: Taken: Pressure: Rate: Glucose (mg/dl): Meter #: Oximetry (%) Taken: Pre 13:10 140/84 71 15 97.6 Post 15:15 142/73 65 15 98.3 Treatment Response Treatment Toleration: Well Treatment Completion Treatment Completed without Adverse Event Status: [name] [name] Notes No concerns with treatment given Physician HBO Attestation: I certify that I supervised this HBO treatment in accordance with Medicare guidelines. A  trained Yes Yes emergency response team is readily available per hospital policies and procedures. Continue HBOT as ordered. Yes Electronic Signature(s) Signed: 11/14/2019 6:04:26 PM By: [name] MD Previous Signature: 11/14/2019 4:37:37 PM Version By: Mikeal Hawthorne EMT/HBOT Entered By: [name] on 11/14/2019 18:01:46 -------------------------------------------------------------------------------- HBO Safety Checklist Details Patient Name: Date of Service: [name] [name]. 11/14/2019 1:00 PM Medical Record [id number] Patient Account Number: [id number] Date of Birth/Sex: Treating RN: 1956-10-07 (64 y.o. M) Primary Care Soua Caltagirone: [name] Other Clinician: Mikeal Hawthorne Referring Bueford Arp: Treating Donyell Carrell/Extender:[name], [name], [name] in Treatment: 8 HBO Safety Checklist Items Safety Checklist Consent Form Signed Patient voided / foley secured and emptied When did you last eato n/a Last dose of injectable or oral agent n/a NA Ostomy pouch emptied and vented if applicable NA All implantable devices assessed, documented and approved NA Intravenous access site secured and place Valuables secured Linens and cotton and cotton/polyester blend (less than 51% polyester) Personal oil-based products / skin lotions / body lotions removed NA Wigs or hairpieces removed NA Smoking or tobacco materials removed Books / newspapers / magazines / loose paper removed Cologne, aftershave, perfume and deodorant removed Jewelry removed (may wrap wedding band) NA Make-up removed Hair care products removed NA Battery operated devices (external) removed NA Heating patches and chemical warmers removed NA Titanium eyewear removed NA Nail polish cured greater than 10 hours NA Casting material cured greater than 10 hours NA Hearing aids removed NA Loose dentures or partials removed NA Prosthetics have been removed Patient demonstrates correct use of  air break device (if applicable) Patient concerns have been addressed Patient grounding bracelet on and cord attached to chamber Specifics for Inpatients (complete in addition to above) Medication sheet sent with patient Intravenous medications needed or due during therapy sent with patient Drainage tubes (e.g. nasogastric tube or chest tube secured and vented) Endotracheal or Tracheotomy tube secured Cuff deflated of air and  inflated with saline Airway suctioned Electronic Signature(s) Signed: 11/14/2019 1:31:12 PM By: Mikeal Hawthorne EMT/HBOT Entered By: Mikeal Hawthorne on 11/14/2019 13:31:12

## 2019-11-15 ENCOUNTER — Encounter (HOSPITAL_BASED_OUTPATIENT_CLINIC_OR_DEPARTMENT_OTHER): Payer: Federal, State, Local not specified - PPO | Admitting: Internal Medicine

## 2019-11-15 ENCOUNTER — Other Ambulatory Visit: Payer: Self-pay

## 2019-11-15 DIAGNOSIS — N3041 Irradiation cystitis with hematuria: Secondary | ICD-10-CM | POA: Diagnosis not present

## 2019-11-15 NOTE — Progress Notes (Addendum)
[name] [name], [name] [name] ([id number]) Visit Report for 11/15/2019 HBO Details Patient Name: Date of Service: [name] [name], [name] [name] 11/15/2019 1:00 PM Medical Record [id number] Patient Account Number: [id number] Date of Birth/Sex: Treating RN: August 14, 1956 (64 y.o. M) Primary Care [name] [name]: [name] [name] Other Clinician: Mikeal [name] Referring [name] [name]: Treating [name] [name]/Extender:[name] [name] in Treatment: 9 HBO Treatment Course Details Treatment Course Number: 1 Ordering [name] [name]: [name] [name] Total Treatments Ordered: 40 HBO Treatment Start Date: 09/19/2019 HBO Indication: Late Effect of Radiation HBO Treatment Details Treatment Number: 37 Patient Type: Outpatient Chamber Type: Monoplace Chamber Serial #: [id number] Treatment Protocol: 2.5 ATA with 90 minutes oxygen, with two 5 minute air breaks Treatment Details Compression Rate Down: 2.0 psi / minute De-Compression Rate Up: 2.0 psi / minute Air breaks and CompressTx Pressure breathing periods DecompressDecompress Begins Reached (leave unused spaces Begins Ends blank) Chamber Pressure (ATA)1 2.5 2.5 2.5 2.5 2.5 --2.5 1 Clock Time (24 hr) 12:56 13:08 [name] [name] 15:00 Treatment Length: 124 (minutes) Treatment Segments: 4 Vital Signs Capillary Blood Glucose Reference Range: 80 - 120 mg / dl HBO Diabetic Blood Glucose Intervention Range: <131 mg/dl or >249 mg/dl Time Vitals Blood Respiratory Capillary Blood Glucose Pulse Action Type: Pulse: Temperature: Taken: Pressure: Rate: Glucose (mg/dl): Meter #: Oximetry (%) Taken: Pre 12:55 167/74 59 18 97.8 Post 15:03 144/79 69 17 97.5 Treatment Response Treatment Toleration: Well Treatment Completion Treatment Completed without Adverse Event Status: [name] [name] Notes No concerns with treatment given Physician HBO Attestation: I certify that I supervised this HBO treatment in accordance with Medicare guidelines. A  trained Yes Yes emergency response team is readily available per hospital policies and procedures. Continue HBOT as ordered. Yes Electronic Signature(s) Signed: 11/15/2019 6:16:53 PM By: [name] Ham MD Previous Signature: 11/15/2019 3:22:35 PM Version By: [name] [name] EMT/HBOT Entered By: [name] [name] on 11/15/2019 18:14:54 -------------------------------------------------------------------------------- HBO Safety Checklist Details Patient Name: Date of Service: [name] [name]. 11/15/2019 1:00 PM Medical Record [id number] Patient Account Number: [id number] Date of Birth/Sex: Treating RN: 1956-02-17 (64 y.o. M) Primary Care [name] [name]: [name] [name] Other Clinician: Mikeal [name] Referring [name] [name]: Treating [name] [name]/Extender:[name] [name] in Treatment: 9 HBO Safety Checklist Items Safety Checklist Consent Form Signed Patient voided / foley secured and emptied When did you last eato n/a Last dose of injectable or oral agent n/a NA Ostomy pouch emptied and vented if applicable NA All implantable devices assessed, documented and approved NA Intravenous access site secured and place Valuables secured Linens and cotton and cotton/polyester blend (less than 51% polyester) Personal oil-based products / skin lotions / body lotions removed NA Wigs or hairpieces removed NA Smoking or tobacco materials removed Books / newspapers / magazines / loose paper removed Cologne, aftershave, perfume and deodorant removed Jewelry removed (may wrap wedding band) NA Make-up removed Hair care products removed NA Battery operated devices (external) removed NA Heating patches and chemical warmers removed NA Titanium eyewear removed NA Nail polish cured greater than 10 hours NA Casting material cured greater than 10 hours NA Hearing aids removed NA Loose dentures or partials removed NA Prosthetics have been removed Patient demonstrates correct use of  air break device (if applicable) Patient concerns have been addressed Patient grounding bracelet on and cord attached to chamber Specifics for Inpatients (complete in addition to above) Medication sheet sent with patient Intravenous medications needed or due during therapy sent with patient Drainage tubes (e.g. nasogastric tube or chest tube secured and vented) Endotracheal or Tracheotomy tube secured Cuff deflated of air and  inflated with saline Airway suctioned Electronic Signature(s) Signed: 11/15/2019 1:17:40 PM By: [name] [name] EMT/HBOT Entered By: [name] [name] on 11/15/2019 13:17:39

## 2019-11-15 NOTE — Progress Notes (Signed)
ARK, NAZARENO ([id number]) Visit Report for 11/15/2019 Arrival Information Details Patient Name: Date of Service: [name], [name] 11/15/2019 1:00 PM Medical Record [id number] Patient Account Number: [id number] Date of Birth/Sex: Treating RN: 1956-02-22 (64 y.o. M) Primary Care Philippa Vessey: [name] Other Clinician: Mikeal Hawthorne Referring Kyland No: Treating Epic Tribbett/Extender:[name], [name], [name] in Treatment: 9 Visit Information History Since Last Visit Added or deleted any medications: No Patient Arrived: Ambulatory Any new allergies or adverse reactions: No Arrival Time: 12:50 Had a fall or experienced change in No Accompanied By: self activities of daily living that may affect Transfer Assistance: None risk of falls: Patient Identification Verified: Yes Signs or symptoms of abuse/neglect since last No Secondary Verification Process Yes visito Completed: Hospitalized since last visit: No Patient Requires Transmission-Based No Implantable device outside of the clinic excluding No Precautions: cellular tissue based products placed in the center Patient Has Alerts: No since last visit: Pain Present Now: No Electronic Signature(s) Signed: 11/15/2019 3:22:35 PM By: Mikeal Hawthorne EMT/HBOT Entered By: Mikeal Hawthorne on 11/15/2019 13:16:53 -------------------------------------------------------------------------------- Encounter Discharge Information Details Patient Name: Date of Service: [name] Baptise A. 11/15/2019 1:00 PM Medical Record [id number] Patient Account Number: [id number] Date of Birth/Sex: Treating RN: 12-08-1955 (64 y.o. M) Primary Care Shayona Hibbitts: [name] Other Clinician: Mikeal Hawthorne Referring Lillyahna Hemberger: Treating Faithlynn Deeley/Extender:[name], [name], [name] in Treatment: 9 Encounter Discharge Information Items Discharge Condition: Stable Ambulatory Status: Ambulatory Discharge Destination:  Home Transportation: Private Auto Accompanied By: self Schedule Follow-up Appointment: Yes Clinical Summary of Care: Patient Declined Electronic Signature(s) Signed: 11/15/2019 3:22:35 PM By: Mikeal Hawthorne EMT/HBOT Entered By: Mikeal Hawthorne on 11/15/2019 15:22:11 -------------------------------------------------------------------------------- Patient/Caregiver Education Details Patient Name: Date of Service: [name] [name], [name] A. 1/12/2021andnbsp1:00 PM Medical Record [id number] Patient Account Number: [id number] Date of Birth/Gender: Treating RN: 20-Aug-1956 (63 y.o. M) Primary Care Physician: [name] Other Clinician: Mikeal Hawthorne Referring Physician: Treating Physician/Extender:[name], [name], [name] in Treatment: 9 Education Assessment Education Provided To: Patient Education Topics Provided Hyperbaric Oxygenation: Methods: Explain/Verbal Responses: State content correctly Electronic Signature(s) Signed: 11/15/2019 3:22:35 PM By: Mikeal Hawthorne EMT/HBOT Entered By: Mikeal Hawthorne on 11/15/2019 15:22:02 -------------------------------------------------------------------------------- Vitals Details Patient Name: Date of Service: [name] [name] 11/15/2019 1:00 PM Medical Record [id number] Patient Account Number: [id number] Date of Birth/Sex: Treating RN: 10-01-1956 (64 y.o. M) Primary Care Marcoantonio Legault: [name] Other Clinician: Mikeal Hawthorne Referring Kea Callan: Treating Argyle Gustafson/Extender:[name], [name], [name] in Treatment: 9 Vital Signs Time Taken: 12:55 Temperature (F): 97.8 Pulse (bpm): 59 Respiratory Rate (breaths/min): 18 Blood Pressure (mmHg): 167/74 Reference Range: 80 - 120 mg / dl Electronic Signature(s) Signed: 11/15/2019 3:22:35 PM By: Mikeal Hawthorne EMT/HBOT Entered By: Mikeal Hawthorne on 11/15/2019 13:17:07

## 2019-11-15 NOTE — Progress Notes (Signed)
[name] [name], [name] [name] ([id number]) Visit Report for 11/15/2019 SuperBill Details Patient Name: Date of Service: LEHI, CORNS 11/15/2019 Medical Record [id number] Patient Account Number: [id number] Date of Birth/Sex: Treating RN: 03-31-1956 (64 y.o. 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

## 2019-11-16 ENCOUNTER — Encounter (HOSPITAL_BASED_OUTPATIENT_CLINIC_OR_DEPARTMENT_OTHER): Payer: Federal, State, Local not specified - PPO | Attending: Physician Assistant | Admitting: Physician Assistant

## 2019-11-16 DIAGNOSIS — N3041 Irradiation cystitis with hematuria: Secondary | ICD-10-CM | POA: Diagnosis not present

## 2019-11-16 NOTE — Progress Notes (Signed)
[name] [name], [name] [name] ([id number]) Visit Report for 11/16/2019 Arrival Information Details Patient Name: Date of Service: [name] [name], [name] [name] 11/16/2019 1:00 PM Medical Record [id number] Patient Account Number: [id number] Date of Birth/Sex: Treating RN: 1955-12-19 (64 y.o. M) Primary Care [place]: [name] Other Clinician: Mikeal Hawthorne Referring Olyvia Gopal: Treating Malaki Koury/Extender:Stone III, Joannie [place], [name] in Treatment: 9 Visit Information History Since Last Visit Added or deleted any medications: No Patient Arrived: Ambulatory Any new allergies or adverse reactions: No Arrival Time: 12:55 Had a fall or experienced change in No Accompanied By: self activities of daily living that may affect Transfer Assistance: None risk of falls: Patient Identification Verified: Yes Signs or symptoms of abuse/neglect since last No Secondary Verification Process Yes visito Completed: Hospitalized since last visit: No Patient Requires Transmission-Based No Implantable device outside of the clinic excluding No Precautions: cellular tissue based products placed in the center Patient Has Alerts: No since last visit: Pain Present Now: No Electronic Signature(s) Signed: 11/16/2019 3:30:11 PM By: Mikeal Hawthorne EMT/HBOT Entered By: Mikeal Hawthorne on 11/16/2019 13:29:30 -------------------------------------------------------------------------------- Encounter Discharge Information Details Patient Name: Date of Service: [name] Baptise A. 11/16/2019 1:00 PM Medical Record [id number] Patient Account Number: [id number] Date of Birth/Sex: Treating RN: [birth year] (64 y.o. M) Primary Care Muhammadali Ries: [name] Other Clinician: Mikeal Hawthorne Referring Adrielle Polakowski: Treating Katessa Attridge/Extender:Stone III, Joannie [place], [name] in Treatment: 9 Encounter Discharge Information Items Discharge Condition: Stable Ambulatory Status: Ambulatory Discharge Destination:  Home Transportation: Private Auto Accompanied By: self Schedule Follow-up Appointment: Yes Clinical Summary of Care: Patient Declined Electronic Signature(s) Signed: 11/16/2019 3:30:11 PM By: Mikeal Hawthorne EMT/HBOT Entered By: Mikeal Hawthorne on 11/16/2019 15:29:39 -------------------------------------------------------------------------------- Patient/Caregiver Education Details Patient Name: Date of Service: [name] [name], [name] A. 1/13/2021andnbsp1:00 PM Medical Record [phone] Patient Account Number: [id number] Date of Birth/Gender: Treating RN: Aug 13, 1956 (63 y.o. M) Primary Care Physician: [name] Other Clinician: Mikeal Hawthorne Referring Physician: Treating Physician/Extender:Stone III, Joannie [place], [name] in Treatment: 9 Education Assessment Education Provided To: Patient Education Topics Provided Hyperbaric Oxygenation: Methods: Explain/Verbal Responses: State content correctly Electronic Signature(s) Signed: 11/16/2019 3:30:11 PM By: Mikeal Hawthorne EMT/HBOT Entered By: Mikeal Hawthorne on 11/16/2019 15:29:28 -------------------------------------------------------------------------------- Vitals Details Patient Name: Date of Service: [name] Baptise A. 11/16/2019 1:00 PM Medical Record [id number] Patient Account Number: [id number] Date of Birth/Sex: Treating RN: 1956-01-25 (63 y.o. M) Primary Care Kristof Nadeem: [name] Other Clinician: Mikeal Hawthorne Referring Ismeal Heider: Treating Christoper Bushey/Extender:Stone III, Joannie [place], [name] in Treatment: 9 Vital Signs Time Taken: 13:00 Temperature (F): 98.2 Pulse (bpm): 99 Respiratory Rate (breaths/min): 18 Blood Pressure (mmHg): 183/88 Reference Range: 80 - 120 mg / dl Electronic Signature(s) Signed: 11/16/2019 3:30:11 PM By: Mikeal Hawthorne EMT/HBOT Entered By: Mikeal Hawthorne on 11/16/2019 13:29:48

## 2019-11-16 NOTE — Progress Notes (Addendum)
[name] [name], [name] [name] ([id number]) Visit Report for 11/16/2019 HBO Details Patient Name: Date of Service: [name] [name], [name] [name] 11/16/2019 1:00 PM Medical Record [id number] Patient Account Number: [id number] Date of Birth/Sex: Treating RN: August 21, 1956 (64 y.o. M) Primary Care Maghan Jessee: [name] Other Clinician: Mikeal Hawthorne Referring Romar Woodrick: Treating Joram Venson/Extender:Stone III, Joannie [place], [name] in Treatment: 9 HBO Treatment Course Details Treatment Course Number: 1 Ordering Corbin Hott: [name] Total Treatments Ordered: 40 HBO Treatment Start Date: 09/19/2019 HBO Indication: Late Effect of Radiation HBO Treatment Details Treatment Number: 38 Patient Type: Outpatient Chamber Type: Monoplace Chamber Serial #: [id number] Treatment Protocol: 2.5 ATA with 90 minutes oxygen, with two 5 minute air breaks Treatment Details Compression Rate Down: 2.0 psi / minute De-Compression Rate Up: 2.0 psi / minute Air breaks and CompressTx Pressure breathing periods DecompressDecompress Begins Reached (leave unused spaces Begins Ends blank) Chamber Pressure (ATA)1 2.5 2.5 2.5 2.5 2.5 --2.5 1 Clock Time (24 hr) 13:03 13:15 13:4513:5014:2014:25--14:55 15:07 Treatment Length: 124 (minutes) Treatment Segments: 4 Vital Signs Capillary Blood Glucose Reference Range: 80 - 120 mg / dl HBO Diabetic Blood Glucose Intervention Range: <131 mg/dl or >249 mg/dl Time Vitals Blood Respiratory Capillary Blood Glucose Pulse Action Type: Pulse: Temperature: Taken: Pressure: Rate: Glucose (mg/dl): Meter #: Oximetry (%) Taken: Pre 13:00 183/88 99 18 98.2 Post 15:10 142/76 66 16 97.8 Treatment Response Treatment Toleration: Well Treatment Completion Treatment Completed without Adverse Event Status: Physician HBO Attestation: I certify that I supervised this HBO treatment in accordance with Medicare guidelines. A trained Yes emergency response team is readily available  per hospital policies and procedures. Continue HBOT as ordered. Yes Electronic Signature(s) Signed: 11/16/2019 5:54:52 PM By: [name] PA-C Previous Signature: 11/16/2019 3:30:11 PM Version By: Mikeal Hawthorne EMT/HBOT Entered By: [name] on 11/16/2019 17:54:52 -------------------------------------------------------------------------------- HBO Safety Checklist Details Patient Name: Date of Service: [name] [name], [name] [name] 11/16/2019 1:00 PM Medical Record [id number] Patient Account Number: [id number] Date of Birth/Sex: Treating RN: 06-Feb-1956 (64 y.o. M) Primary Care Clevon Khader: [name] Other Clinician: Mikeal Hawthorne Referring Mazikeen Hehn: Treating Tavious Griesinger/Extender:Stone III, [place]annie [place], [name] in Treatment: 9 HBO Safety Checklist Items Safety Checklist Consent Form Signed Patient voided / foley secured and emptied When did you last eato n/a Last dose of injectable or oral agent n/a NA Ostomy pouch emptied and vented if applicable NA All implantable devices assessed, documented and approved NA Intravenous access site secured and place Valuables secured Linens and cotton and cotton/polyester blend (less than 51% polyester) Personal oil-based products / skin lotions / body lotions removed NA Wigs or hairpieces removed NA Smoking or tobacco materials removed Books / newspapers / magazines / loose paper removed Cologne, aftershave, perfume and deodorant removed Jewelry removed (may wrap wedding band) NA Make-up removed Hair care products removed NA Battery operated devices (external) removed NA Heating patches and chemical warmers removed NA Titanium eyewear removed NA Nail polish cured greater than 10 hours NA Casting material cured greater than 10 hours NA Hearing aids removed NA Loose dentures or partials removed NA Prosthetics have been removed Patient demonstrates correct use of air break device (if applicable) Patient concerns have been  addressed Patient grounding bracelet on and cord attached to chamber Specifics for Inpatients (complete in addition to above) Medication sheet sent with patient Intravenous medications needed or due during therapy sent with patient Drainage tubes (e.g. nasogastric tube or chest tube secured and vented) Endotracheal or Tracheotomy tube secured Cuff deflated of air and inflated with saline Airway  suctioned Electronic Signature(s) Signed: 11/16/2019 1:30:21 PM By: Mikeal Hawthorne EMT/HBOT Entered By: Mikeal Hawthorne on 11/16/2019 13:30:20

## 2019-11-16 NOTE — Progress Notes (Signed)
[name], [name] ([id number]) Visit Report for 11/16/2019 Problem List Details Patient Name: Date of Service: [name] [name], [name] [name] 11/16/2019 1:00 PM Medical Record [id number] Patient Account Number: [id number] Date of Birth/Sex: Treating RN: 05/01/1956 (64 y.o. M) Primary Care Provider: [name] Other Clinician: Referring Provider: Treating Provider/Extender:Stone III, Joannie Springs, Madelon Lips Weeks in Treatment: 9 Active Problems ICD-10 Evaluated Encounter Code Description Active Date Today Diagnosis N30.41 Irradiation cystitis with hematuria 09/13/2019 No Yes R31.0 Gross hematuria 09/13/2019 No Yes Z85.46 Personal history of malignant neoplasm of prostate 09/13/2019 No Yes Inactive Problems Resolved Problems Electronic Signature(s) Signed: 11/16/2019 5:54:59 PM By: [name] PA-C Entered By: [name] on 11/16/2019 17:54:58 -------------------------------------------------------------------------------- SuperBill Details Patient Name: Date of Service: [name] 11/16/2019 Medical Record [id number] Patient Account Number: [id number] Date of Birth/Sex: Treating RN: 29-Jul-1956 (63 y.o. 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

## 2019-11-17 ENCOUNTER — Encounter (HOSPITAL_BASED_OUTPATIENT_CLINIC_OR_DEPARTMENT_OTHER): Payer: Federal, State, Local not specified - PPO | Admitting: Internal Medicine

## 2019-11-17 ENCOUNTER — Other Ambulatory Visit: Payer: Self-pay

## 2019-11-17 DIAGNOSIS — N3041 Irradiation cystitis with hematuria: Secondary | ICD-10-CM | POA: Diagnosis not present

## 2019-11-17 NOTE — Progress Notes (Signed)
[name] [name], [name] [name] ([id number]) Visit Report for 11/17/2019 SuperBill Details Patient Name: Date of Service: [name], [name] 11/17/2019 Medical Record [id number] Patient Account Number: [id number] Date of Birth/Sex: Treating RN: [birth year] (64 y.o. M) Primary Care Provider: [name] Other Clinician: Mikeal Hawthorne Referring Provider: Treating Provider/Extender:Ajahni Nay, [name], [name] in Treatment: 9 Diagnosis Coding ICD-10 Codes Code Description N30.41 Irradiation cystitis with hematuria R31.0 Gross hematuria Z85.46 Personal history of malignant neoplasm of prostate Facility Procedures CPT4 Code Description Modifier Quantity [id number] G0277-(Facility Use Only) HBOT, full body chamber, 13min 4 Physician Procedures CPT4 Code Description Modifier Quantity [id number] - WC PHYS HYPERBARIC OXYGEN THERAPY 1 ICD-10 Diagnosis Description N30.41 Irradiation cystitis with hematuria Electronic Signature(s) Signed: 11/17/2019 3:33:56 PM By: Mikeal Hawthorne EMT/HBOT Signed: 11/17/2019 5:49:05 PM By: [name] MD Entered By: Mikeal Hawthorne on 11/17/2019 15:33:03

## 2019-11-17 NOTE — Progress Notes (Addendum)
[name], [name] ([id number]) Visit Report for 11/17/2019 HBO Details Patient Name: Date of Service: [name] [name], [name] [name] 11/17/2019 1:00 PM Medical Record [id number] Patient Account Number: [id number] Date of Birth/Sex: Treating RN: Dec 12, 1955 (64 y.o. M) Primary Care Areej Tayler: [name] Other Clinician: Mikeal Hawthorne Referring Anoushka Divito: Treating Zyrell Carmean/Extender:[name], [name], [name] in Treatment: 9 HBO Treatment Course Details Treatment Course Number: 1 Ordering Coury Grieger: [name] Total Treatments Ordered: 40 HBO Treatment Start Date: 09/19/2019 HBO Indication: Late Effect of Radiation HBO Treatment Details Treatment Number: 39 Patient Type: Outpatient Chamber Type: Monoplace Chamber Serial #: [id number] Treatment Protocol: 2.5 ATA with 90 minutes oxygen, with two 5 minute air breaks Treatment Details Compression Rate Down: 2.0 psi / minute De-Compression Rate Up: 2.0 psi / minute Air breaks and CompressTx Pressure breathing periods DecompressDecompress Begins Reached (leave unused spaces Begins Ends blank) Chamber Pressure (ATA)1 2.5 2.5 2.5 2.5 2.5 --2.5 1 Clock Time (24 hr) 12:56 13:08 [id number] 15:00 Treatment Length: 124 (minutes) Treatment Segments: 4 Vital Signs Capillary Blood Glucose Reference Range: 80 - 120 mg / dl HBO Diabetic Blood Glucose Intervention Range: <131 mg/dl or >249 mg/dl Time Vitals Blood Respiratory Capillary Blood Glucose Pulse Action Type: Pulse: Temperature: Taken: Pressure: Rate: Glucose (mg/dl): Meter #: Oximetry (%) Taken: Pre 12:55 180/91 88 16 98 Post 15:05 157/70 100 17 97.8 Treatment Response Treatment Toleration: Well Treatment Completion Treatment Completed without Adverse Event Status: Yona Stansbury Notes No concerns with treatment given Physician HBO Attestation: I certify that I supervised this HBO treatment in accordance with Medicare guidelines. A  trained Yes Yes emergency response team is readily available per hospital policies and procedures. Continue HBOT as ordered. Yes Electronic Signature(s) Signed: 11/17/2019 5:49:05 PM By: [name] MD Previous Signature: 11/17/2019 3:33:56 PM Version By: Mikeal Hawthorne EMT/HBOT Entered By: [name] on 11/17/2019 17:46:43 -------------------------------------------------------------------------------- HBO Safety Checklist Details Patient Name: Date of Service: [name] [name]. 11/17/2019 1:00 PM Medical Record [id number] Patient Account Number: [id number] Date of Birth/Sex: Treating RN: 1956/03/02 (64 y.o. M) Primary Care Camron Monday: [name] Other Clinician: Mikeal Hawthorne Referring Hiromi Knodel: Treating Denielle Bayard/Extender:[name], [name], [name] in Treatment: 9 HBO Safety Checklist Items Safety Checklist Consent Form Signed Patient voided / foley secured and emptied When did you last eato n/a Last dose of injectable or oral agent n/a NA Ostomy pouch emptied and vented if applicable NA All implantable devices assessed, documented and approved NA Intravenous access site secured and place Valuables secured Linens and cotton and cotton/polyester blend (less than 51% polyester) Personal oil-based products / skin lotions / body lotions removed NA Wigs or hairpieces removed NA Smoking or tobacco materials removed Books / newspapers / magazines / loose paper removed Cologne, aftershave, perfume and deodorant removed Jewelry removed (may wrap wedding band) NA Make-up removed Hair care products removed NA Battery operated devices (external) removed NA Heating patches and chemical warmers removed NA Titanium eyewear removed NA Nail polish cured greater than 10 hours NA Casting material cured greater than 10 hours NA Hearing aids removed NA Loose dentures or partials removed NA Prosthetics have been removed Patient demonstrates correct use of  air break device (if applicable) Patient concerns have been addressed Patient grounding bracelet on and cord attached to chamber Specifics for Inpatients (complete in addition to above) Medication sheet sent with patient Intravenous medications needed or due during therapy sent with patient Drainage tubes (e.g. nasogastric tube or chest tube secured and vented) Endotracheal or Tracheotomy tube secured Cuff deflated of air and  inflated with saline Airway suctioned Electronic Signature(s) Signed: 11/17/2019 1:33:13 PM By: Mikeal Hawthorne EMT/HBOT Entered By: Mikeal Hawthorne on 11/17/2019 13:33:12

## 2019-11-17 NOTE — Progress Notes (Signed)
IROH, JESCHKE ([id number]) Visit Report for 11/17/2019 Arrival Information Details Patient Name: Date of Service: [name] [name], [name] [name] 11/17/2019 1:00 PM Medical Record [id number] Patient Account Number: [id number] Date of Birth/Sex: Treating RN: Nov 14, 1955 (64 y.o. M) Primary Care Raylea Adcox: [name] Other Clinician: Mikeal Hawthorne Referring Otillia Cordone: Treating Cashus Halterman/Extender:[name], [name], [name] in Treatment: 9 Visit Information History Since Last Visit Added or deleted any medications: No Patient Arrived: Ambulatory Any new allergies or adverse reactions: No Arrival Time: 12:50 Had a fall or experienced change in No Accompanied By: self activities of daily living that may affect Transfer Assistance: None risk of falls: Patient Identification Verified: Yes Signs or symptoms of abuse/neglect since last No Secondary Verification Process Yes visito Completed: Hospitalized since last visit: No Patient Requires Transmission-Based No Implantable device outside of the clinic excluding No Precautions: cellular tissue based products placed in the center Patient Has Alerts: No since last visit: Pain Present Now: No Electronic Signature(s) Signed: 11/17/2019 3:33:56 PM By: Mikeal Hawthorne EMT/HBOT Entered By: Mikeal Hawthorne on 11/17/2019 13:32:21 -------------------------------------------------------------------------------- Encounter Discharge Information Details Patient Name: Date of Service: [name] Baptise A. 11/17/2019 1:00 PM Medical Record [id number] Patient Account Number: [id number] Date of Birth/Sex: Treating RN: Mar 04, 1956 (64 y.o. M) Primary Care [name] [name]: [name] Other Clinician: Mikeal Hawthorne Referring Anntonette Madewell: Treating Royston Bekele/Extender:[name], [name], [name] in Treatment: 9 Encounter Discharge Information Items Discharge Condition: Stable Ambulatory Status: Ambulatory Discharge Destination:  Home Transportation: Private Auto Accompanied By: self Schedule Follow-up Appointment: Yes Clinical Summary of Care: Patient Declined Electronic Signature(s) Signed: 11/17/2019 3:33:56 PM By: Mikeal Hawthorne EMT/HBOT Entered By: Mikeal Hawthorne on 11/17/2019 15:33:26 -------------------------------------------------------------------------------- Patient/Caregiver Education Details Patient Name: Date of Service: [name], [name] 1/14/2021andnbsp1:00 PM Medical Record [phone] Patient Account Number: [id number] Date of Birth/Gender: Treating RN: April 15, 1956 (63 y.o. M) Primary Care Physician: [name] Other Clinician: Mikeal Hawthorne Referring Physician: Treating Physician/Extender:[name], [name], [name] in Treatment: 9 Education Assessment Education Provided To: Patient Education Topics Provided Hyperbaric Oxygenation: Methods: Explain/Verbal Responses: State content correctly Electronic Signature(s) Signed: 11/17/2019 3:33:56 PM By: Mikeal Hawthorne EMT/HBOT Entered By: Mikeal Hawthorne on 11/17/2019 15:33:16 -------------------------------------------------------------------------------- Vitals Details Patient Name: Date of Service: [name] Baptise A. 11/17/2019 1:00 PM Medical Record [id number] Patient Account Number: [id number] Date of Birth/Sex: Treating RN: 1956-07-13 (64 y.o. M) Primary Care [name] [name]: [name] Other Clinician: Mikeal Hawthorne Referring Tiffany Talarico: Treating [name]/Extender:[name], [name], [name] in Treatment: 9 Vital Signs Time Taken: 12:55 Temperature (F): 98 Pulse (bpm): 88 Respiratory Rate (breaths/min): 16 Blood Pressure (mmHg): 180/91 Reference Range: 80 - 120 mg / dl Electronic Signature(s) Signed: 11/17/2019 3:33:56 PM By: Mikeal Hawthorne EMT/HBOT Entered By: Mikeal Hawthorne on 11/17/2019 13:32:41

## 2019-11-18 ENCOUNTER — Encounter (HOSPITAL_BASED_OUTPATIENT_CLINIC_OR_DEPARTMENT_OTHER): Payer: Federal, State, Local not specified - PPO | Admitting: Internal Medicine

## 2019-11-18 ENCOUNTER — Other Ambulatory Visit: Payer: Self-pay

## 2019-11-18 DIAGNOSIS — N3041 Irradiation cystitis with hematuria: Secondary | ICD-10-CM | POA: Diagnosis not present

## 2019-11-18 NOTE — Progress Notes (Addendum)
PAGE, RAMAGLIA ([id number]) Visit Report for 11/18/2019 HBO Details Patient Name: Date of Service: [name] [name], [name] [name] 11/18/2019 1:00 PM Medical Record [id number] Patient Account Number: [id number] Date of Birth/Sex: Treating RN: 02-Mar-1956 (64 y.o. M) Primary Care [name] [name]: [name] [name] Other Clinician: Mikeal [name] Referring [name] [name]: Treating [name] [name] in Treatment: 9 HBO Treatment Course Details Treatment Course Number: 1 Ordering [name] [name] [name] [name]: Total Treatments Ordered: 40 HBO HBO Indication: Treatment 09/19/2019 Late Effect of Radiation Start Date: HBO Treatment 11/18/2019 End Date: HBO Treatment Series Complete; Non-Wound Discharge Protocol Completed with Symptom Relief Outcome: HBO Treatment Details Treatment Number: 40 Patient Type: Outpatient Chamber Type: Monoplace Chamber Serial #: [id number] Treatment Protocol: 2.5 ATA with 90 minutes oxygen, with two 5 minute air breaks Treatment Details Compression Rate Down: 2.0 psi / minute De-Compression Rate Up: 2.0 psi / minute Air breaks and CompressTx Pressure breathing periods DecompressDecompress Begins Reached (leave unused spaces Begins Ends blank) Chamber Pressure (ATA)1 2.5 2.5 2.5 2.5 2.5 --2.5 1 Clock Time (24 hr) 12:56 13:08 [id number] 15:00 Treatment Length: 124 (minutes) Treatment Segments: 4 Vital Signs Capillary Blood Glucose Reference Range: 80 - 120 mg / dl HBO Diabetic Blood Glucose Intervention Range: <131 mg/dl or >249 mg/dl Time Vitals Blood Respiratory Capillary Blood Glucose Pulse Action Type: Pulse: Temperature: Taken: Pressure: Rate: Glucose (mg/dl): Meter #: Oximetry (%) Taken: Pre 12:55 170/89 84 16 98.1 Post 15:03 135/71 64 14 98.3 Treatment Response Treatment Toleration: Well Treatment Completion Treatment Completed without Adverse Event Status: [name] [name] Notes No concerns with treatment  given. 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 He remarks that he has had no further bleeding. Apparently his urinalysis is clear of microscopic hematuria at urology office. He still has some episodic incontinence but generally thinks this is improved as is his urinary frequency Physician HBO Attestation: I certify that I supervised this HBO treatment in accordance with Medicare guidelines. A trained Yes emergency response team is readily available per hospital policies and procedures. Continue HBOT as ordered. Yes Electronic Signature(s) Signed: 11/18/2019 6:20:19 PM By: [name] Ham MD Entered By: [name] [name] on 11/18/2019 18:19:08 -------------------------------------------------------------------------------- HBO Safety Checklist Details Patient Name: Date of Service: [name] [name]. 11/18/2019 1:00 PM Medical Record [id number] Patient Account Number: [id number] Date of Birth/Sex: Treating RN: 01/15/1956 (64 y.o. M) Primary Care [name] [name]: [name] [name] Other Clinician: Mikeal [name] Referring [name] [name]: Treating [name] [name]/Extender:[name] [name], [name] [name] in Treatment: 9 HBO Safety Checklist Items Safety Checklist Consent Form Signed Patient voided / foley secured and emptied When did you last eato n/a Last dose of injectable or oral agent n/a NA Ostomy pouch emptied and vented if applicable NA All implantable devices assessed, documented and approved NA Intravenous access site secured and place Valuables secured Linens and cotton and cotton/polyester blend (less than 51% polyester) Personal oil-based products / skin lotions / body lotions removed NA Wigs or hairpieces removed NA Smoking or tobacco materials removed Books / newspapers / magazines / loose paper removed Cologne, aftershave, perfume and deodorant removed Jewelry removed (may wrap wedding band) NA Make-up removed Hair care products removed NA Battery  operated devices (external) removed NA Heating patches and chemical warmers removed NA Titanium eyewear removed NA Nail polish cured greater than 10 hours NA Casting material cured greater than 10 hours NA Hearing aids removed NA Loose dentures or partials removed NA Prosthetics have been removed Patient demonstrates correct use of air break device (if applicable) Patient concerns have been addressed  Patient grounding bracelet on and cord attached to chamber Specifics for Inpatients (complete in addition to above) Medication sheet sent with patient Intravenous medications needed or due during therapy sent with patient Drainage tubes (e.g. nasogastric tube or chest tube secured and vented) Endotracheal or Tracheotomy tube secured Cuff deflated of air and inflated with saline Airway suctioned Electronic Signature(s) Signed: 11/18/2019 1:23:12 PM By: [name] [name] EMT/HBOT Entered By: [name] [name] on 11/18/2019 13:23:11

## 2019-11-21 ENCOUNTER — Encounter (HOSPITAL_BASED_OUTPATIENT_CLINIC_OR_DEPARTMENT_OTHER): Payer: Federal, State, Local not specified - PPO | Admitting: Internal Medicine

## 2019-11-22 ENCOUNTER — Other Ambulatory Visit: Payer: Self-pay

## 2019-11-22 ENCOUNTER — Encounter (HOSPITAL_BASED_OUTPATIENT_CLINIC_OR_DEPARTMENT_OTHER): Payer: Federal, State, Local not specified - PPO | Admitting: Internal Medicine

## 2019-11-22 NOTE — Progress Notes (Signed)
IMANI, ELY ([id number]) Visit Report for 11/18/2019 Arrival Information Details Patient Name: Date of Service: [name] [name], [name] [name] 11/18/2019 1:00 PM Medical Record [id number] Patient Account Number: [id number] Date of Birth/Sex: Treating RN: [birth year] (64 y.o. M) Primary Care Eward Rutigliano: [name] Other Clinician: Mikeal Hawthorne Referring Sostenes Kauffmann: Treating Jiovany Scheffel/Extender:[name], [name], [name] in Treatment: 9 Visit Information History Since Last Visit Added or deleted any medications: No Patient Arrived: Ambulatory Any new allergies or adverse reactions: No Arrival Time: 12:50 Had a fall or experienced change in No Accompanied By: self activities of daily living that may affect Transfer Assistance: None risk of falls: Patient Identification Verified: Yes Signs or symptoms of abuse/neglect since last No Secondary Verification Process Yes visito Completed: Hospitalized since last visit: No Patient Requires Transmission-Based No Implantable device outside of the clinic excluding No Precautions: cellular tissue based products placed in the center Patient Has Alerts: No since last visit: Pain Present Now: No Electronic Signature(s) Signed: 11/22/2019 4:37:08 PM By: Mikeal Hawthorne EMT/HBOT Entered By: Mikeal Hawthorne on 11/18/2019 13:22:23 -------------------------------------------------------------------------------- Encounter Discharge Information Details Patient Name: Date of Service: [name] [name] 11/18/2019 1:00 PM Medical Record [id number] Patient Account Number: [id number] Date of Birth/Sex: Treating RN: [birth year] (64 y.o. M) Primary Care Goro Wenrick: [name] Other Clinician: Mikeal Hawthorne Referring Evelyna Folker: Treating Gearld Kerstein/Extender:[name], [name], [name] in Treatment: 9 Encounter Discharge Information Items Discharge Condition: Stable Ambulatory Status: Ambulatory Discharge Destination:  Home Transportation: Private Auto Accompanied By: self Schedule Follow-up Appointment: Yes Clinical Summary of Care: Patient Declined Electronic Signature(s) Signed: 11/22/2019 4:37:08 PM By: Mikeal Hawthorne EMT/HBOT Entered By: Mikeal Hawthorne on 11/18/2019 16:20:11 -------------------------------------------------------------------------------- Patient/Caregiver Education Details Patient Name: Date of Service: [name] [name], [name] A. 1/15/2021andnbsp1:00 PM Medical Record [id number] Patient Account Number: [id number] Date of Birth/Gender: Treating RN: [birth year] (64 y.o. M) Primary Care Physician: [name] Other Clinician: Mikeal Hawthorne Referring Physician: Treating Physician/Extender:[name], [name], [name] in Treatment: 9 Education Assessment Education Provided To: Patient Education Topics Provided Hyperbaric Oxygenation: Methods: Explain/Verbal Responses: State content correctly Electronic Signature(s) Signed: 11/22/2019 4:37:08 PM By: Mikeal Hawthorne EMT/HBOT Entered By: Mikeal Hawthorne on 11/18/2019 16:20:01 -------------------------------------------------------------------------------- Vitals Details Patient Name: Date of Service: [name] [name]. 11/18/2019 1:00 PM Medical Record [id number] Patient Account Number: [id number] Date of Birth/Sex: Treating RN: 1956-10-27 (64 y.o. M) Primary Care Montoya Watkin: [name] Other Clinician: Mikeal Hawthorne Referring Kymberlyn Eckford: Treating Lakasha Mcfall/Extender:[name], [name], [name] in Treatment: 9 Vital Signs Time Taken: 12:55 Temperature (F): 98.1 Pulse (bpm): 84 Respiratory Rate (breaths/min): 16 Blood Pressure (mmHg): 170/89 Reference Range: 80 - 120 mg / dl Electronic Signature(s) Signed: 11/22/2019 4:37:08 PM By: Mikeal Hawthorne EMT/HBOT Entered By: Mikeal Hawthorne on 11/18/2019 13:22:38

## 2019-11-22 NOTE — Progress Notes (Signed)
[name] [name], [name] [name] ([id number]) Visit Report for 11/18/2019 SuperBill Details Patient Name: Date of Service: [name] [name], [name] [name] 11/18/2019 Medical Record [id number] Patient Account Number: [id number] Date of Birth/Sex: Treating RN: [birth year] (64 y.o. 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

## 2019-11-23 ENCOUNTER — Encounter (HOSPITAL_BASED_OUTPATIENT_CLINIC_OR_DEPARTMENT_OTHER): Payer: Federal, State, Local not specified - PPO | Admitting: Physician Assistant

## 2019-11-24 ENCOUNTER — Encounter (HOSPITAL_BASED_OUTPATIENT_CLINIC_OR_DEPARTMENT_OTHER): Payer: Federal, State, Local not specified - PPO | Admitting: Internal Medicine

## 2019-11-25 ENCOUNTER — Encounter (HOSPITAL_BASED_OUTPATIENT_CLINIC_OR_DEPARTMENT_OTHER): Payer: Federal, State, Local not specified - PPO | Admitting: Internal Medicine

## 2019-11-28 ENCOUNTER — Encounter (HOSPITAL_BASED_OUTPATIENT_CLINIC_OR_DEPARTMENT_OTHER): Payer: Federal, State, Local not specified - PPO | Admitting: Internal Medicine

## 2019-11-29 ENCOUNTER — Encounter (HOSPITAL_BASED_OUTPATIENT_CLINIC_OR_DEPARTMENT_OTHER): Payer: Federal, State, Local not specified - PPO | Admitting: Internal Medicine

## 2019-11-30 ENCOUNTER — Encounter (HOSPITAL_BASED_OUTPATIENT_CLINIC_OR_DEPARTMENT_OTHER): Payer: Federal, State, Local not specified - PPO | Admitting: Physician Assistant

## 2019-12-01 ENCOUNTER — Encounter (HOSPITAL_BASED_OUTPATIENT_CLINIC_OR_DEPARTMENT_OTHER): Payer: Federal, State, Local not specified - PPO | Admitting: Internal Medicine

## 2019-12-02 ENCOUNTER — Encounter (HOSPITAL_BASED_OUTPATIENT_CLINIC_OR_DEPARTMENT_OTHER): Payer: Federal, State, Local not specified - PPO | Admitting: Internal Medicine

## 2021-04-09 IMAGING — US US PELVIS LIMITED
1 series · 14 of 21 positions shown · non-contrast
Comparison: None.

CLINICAL DATA: Gross hematuria.

EXAM:
LIMITED ULTRASOUND OF PELVIS
TECHNIQUE: Limited transabdominal ultrasound examination of the pelvis was
performed.

[Series 1: us pelvis limited · 21 acquisitions, 14 frames shown]
[im 1/21]
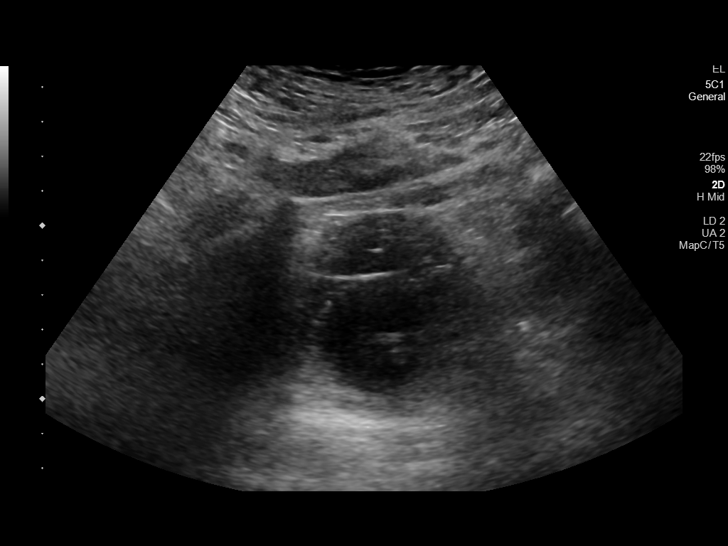
[im 3/21]
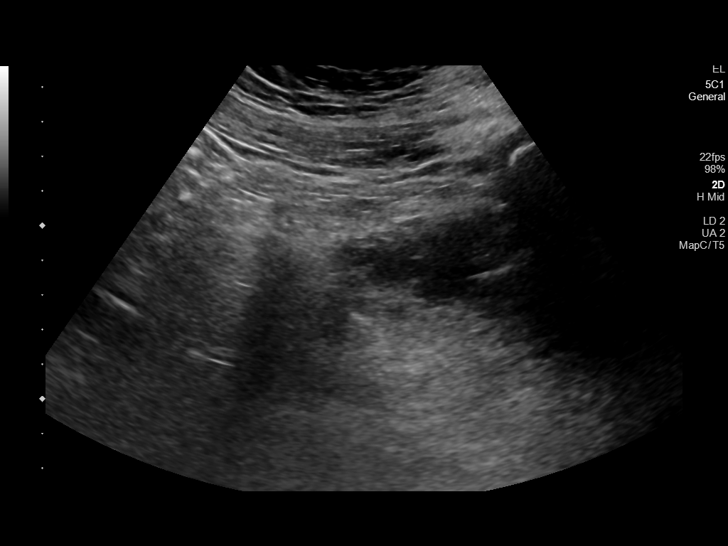
[im 4/21]
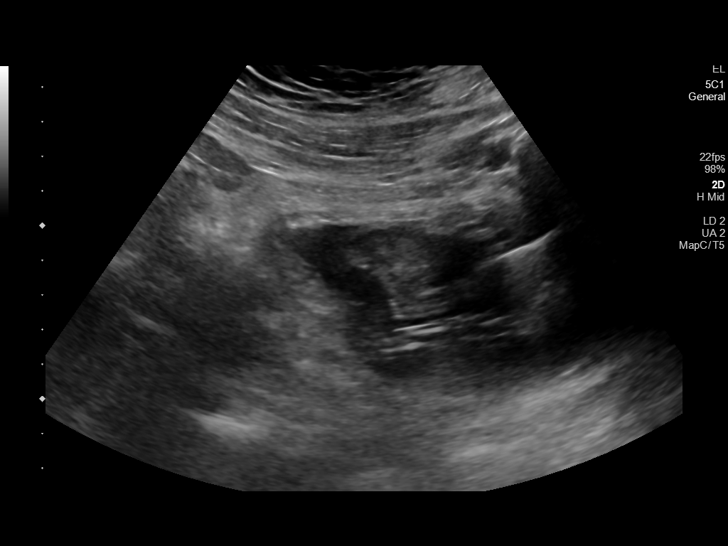
[im 6/21]
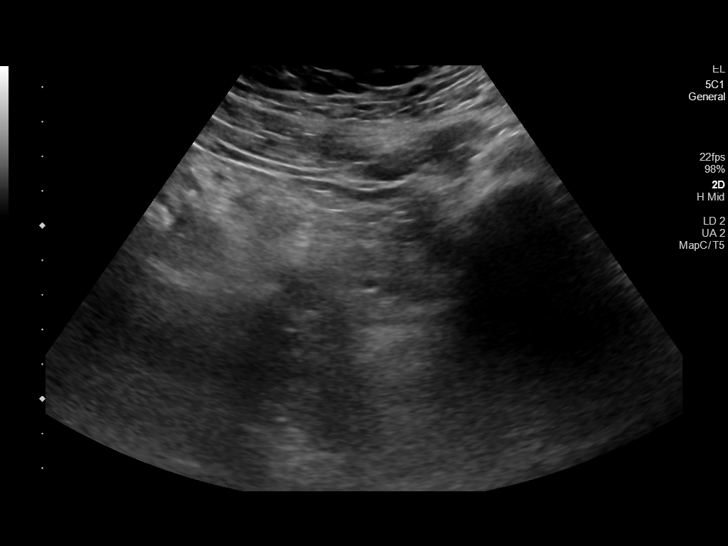
[im 7/21]
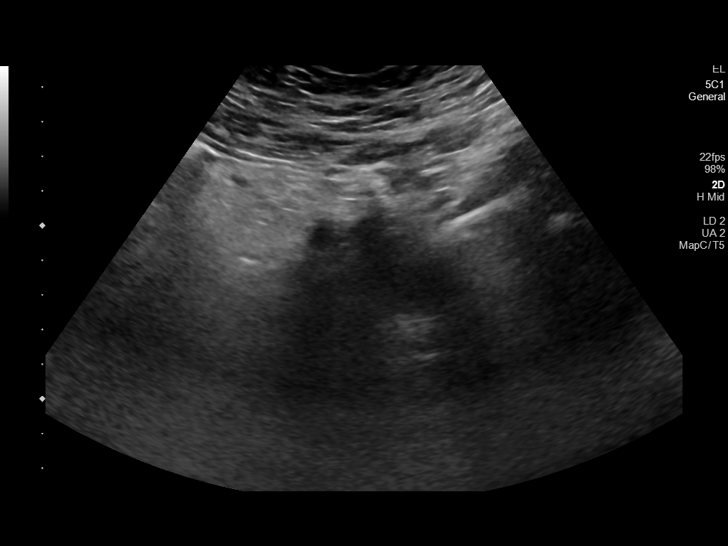
[im 9/21]
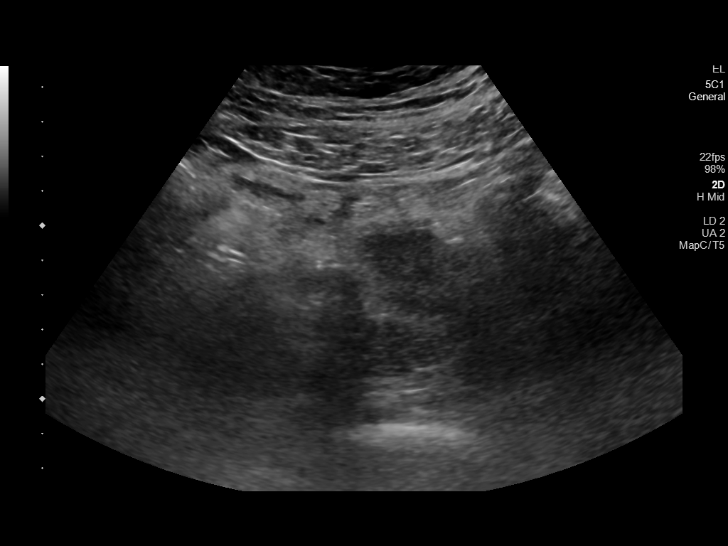
[im 10/21]
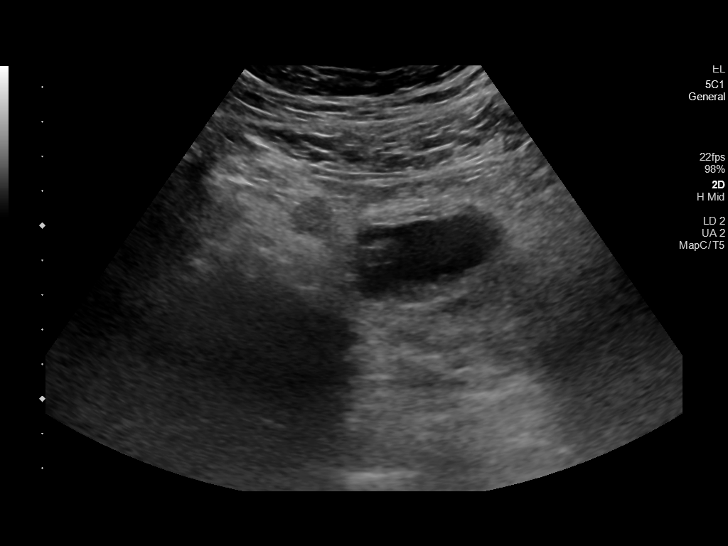
[im 12/21]
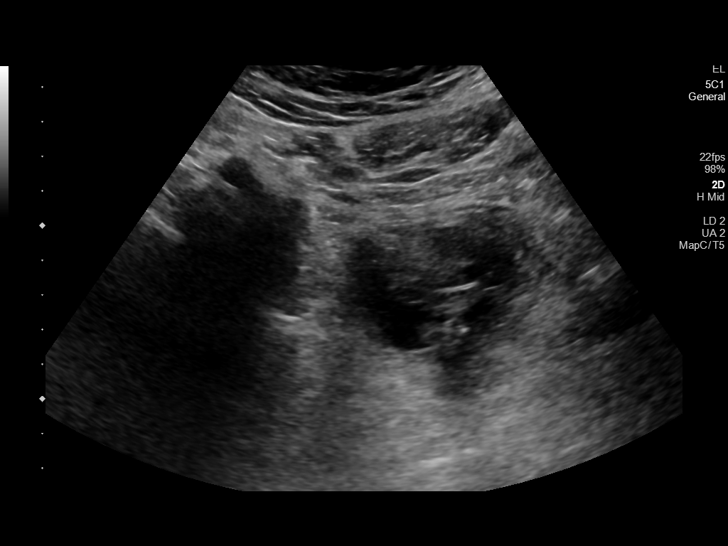
[im 13/21]
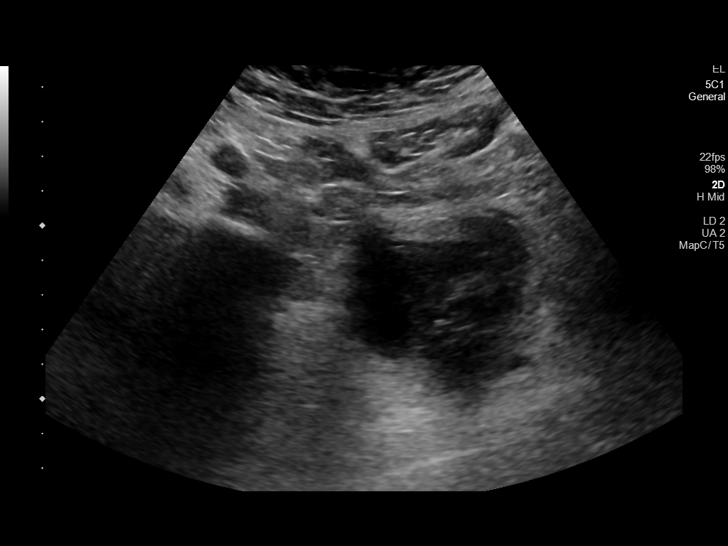
[im 15/21]
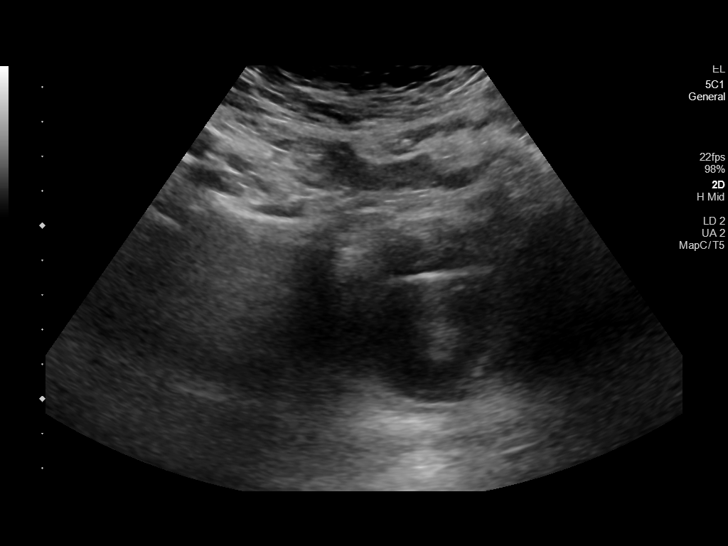
[im 16/21]
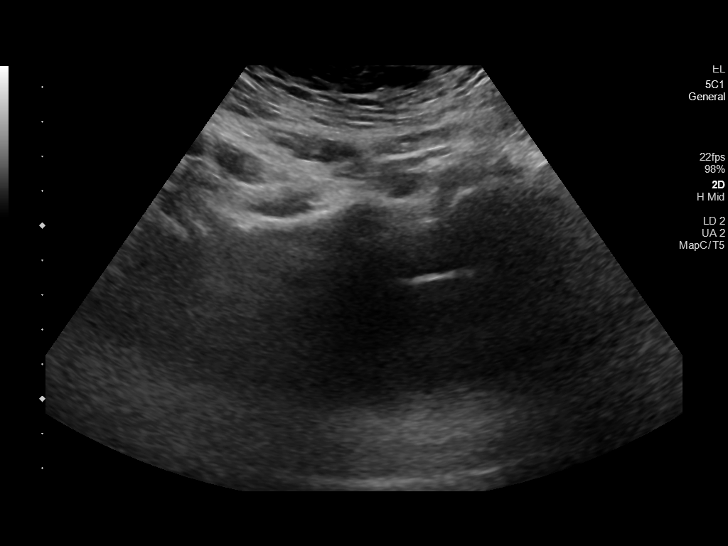
[im 18/21]
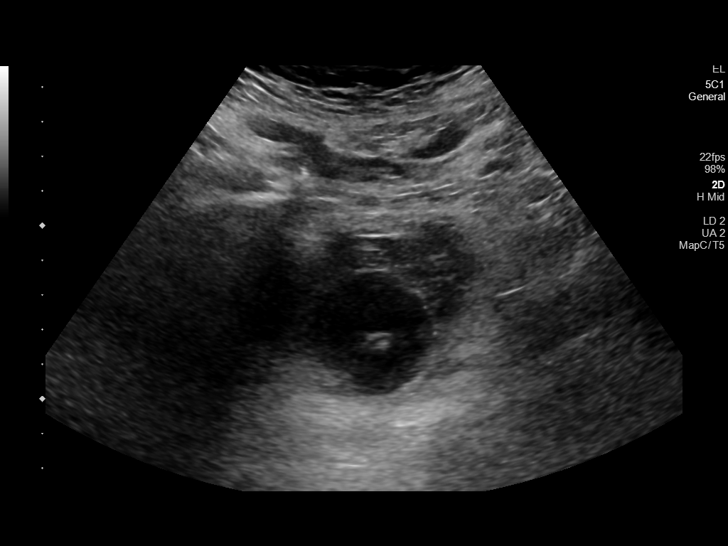
[im 19/21]
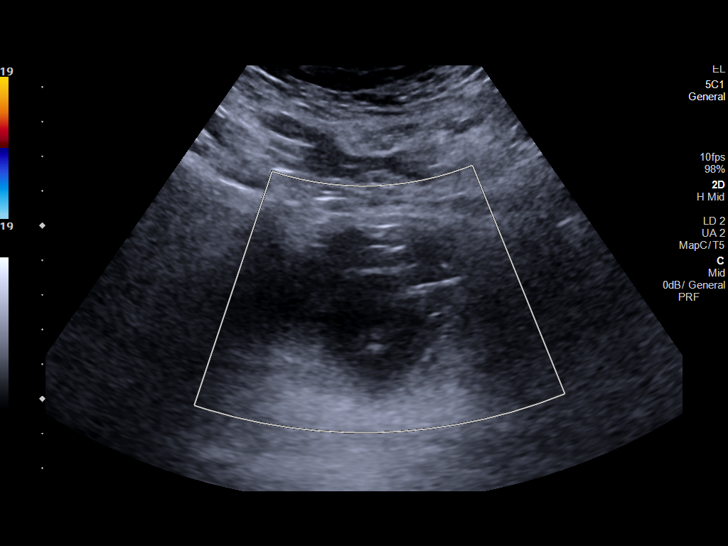
[im 21/21]
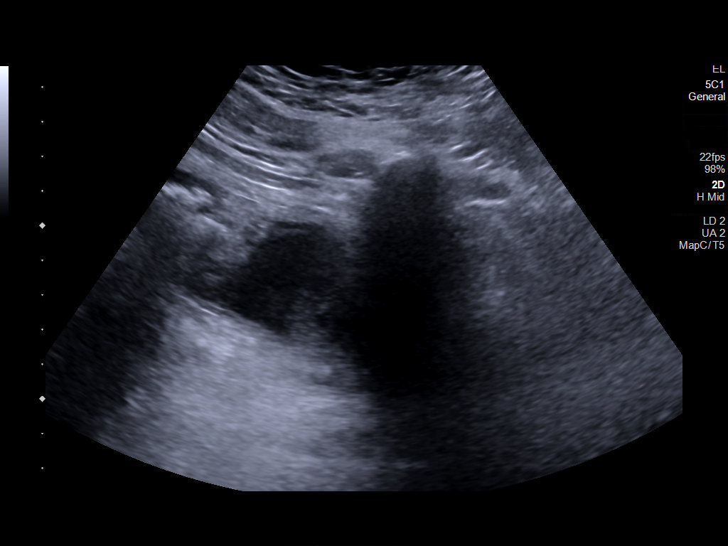

[14 of 21 positions shown; findings below may reference images not displayed]

FINDINGS: Foley catheter seen in the decompressed bladder. Rounded increased
echogenicity superior to the Foley catheter balloon is identified.
IMPRESSION: 1. Foley catheter balloon is seen in the decompressed bladder.
2. Rounded increased echogenicity superior to the Foley catheter
balloon could simply represent blood products given history of gross
hematuria. This study cannot exclude a mass in the bladder.

## 2021-11-18 DIAGNOSIS — C61 Malignant neoplasm of prostate: Secondary | ICD-10-CM | POA: Diagnosis not present

## 2021-11-25 DIAGNOSIS — N3041 Irradiation cystitis with hematuria: Secondary | ICD-10-CM | POA: Diagnosis not present

## 2021-11-25 DIAGNOSIS — R310 Gross hematuria: Secondary | ICD-10-CM | POA: Diagnosis not present

## 2021-11-25 DIAGNOSIS — C61 Malignant neoplasm of prostate: Secondary | ICD-10-CM | POA: Diagnosis not present

## 2021-12-12 DIAGNOSIS — E781 Pure hyperglyceridemia: Secondary | ICD-10-CM | POA: Diagnosis not present

## 2021-12-12 DIAGNOSIS — I10 Essential (primary) hypertension: Secondary | ICD-10-CM | POA: Diagnosis not present

## 2022-02-19 DIAGNOSIS — L578 Other skin changes due to chronic exposure to nonionizing radiation: Secondary | ICD-10-CM | POA: Diagnosis not present

## 2022-02-19 DIAGNOSIS — L814 Other melanin hyperpigmentation: Secondary | ICD-10-CM | POA: Diagnosis not present

## 2022-02-19 DIAGNOSIS — D225 Melanocytic nevi of trunk: Secondary | ICD-10-CM | POA: Diagnosis not present

## 2022-02-19 DIAGNOSIS — L821 Other seborrheic keratosis: Secondary | ICD-10-CM | POA: Diagnosis not present

## 2022-05-26 DIAGNOSIS — C61 Malignant neoplasm of prostate: Secondary | ICD-10-CM | POA: Diagnosis not present

## 2022-06-02 DIAGNOSIS — C61 Malignant neoplasm of prostate: Secondary | ICD-10-CM | POA: Diagnosis not present

## 2022-06-02 DIAGNOSIS — N3041 Irradiation cystitis with hematuria: Secondary | ICD-10-CM | POA: Diagnosis not present

## 2022-06-19 DIAGNOSIS — E781 Pure hyperglyceridemia: Secondary | ICD-10-CM | POA: Diagnosis not present

## 2022-06-19 DIAGNOSIS — Z23 Encounter for immunization: Secondary | ICD-10-CM | POA: Diagnosis not present

## 2022-06-19 DIAGNOSIS — R7303 Prediabetes: Secondary | ICD-10-CM | POA: Diagnosis not present

## 2022-06-19 DIAGNOSIS — Z0000 Encounter for general adult medical examination without abnormal findings: Secondary | ICD-10-CM | POA: Diagnosis not present

## 2022-06-19 DIAGNOSIS — I10 Essential (primary) hypertension: Secondary | ICD-10-CM | POA: Diagnosis not present

## 2022-06-24 DIAGNOSIS — M1711 Unilateral primary osteoarthritis, right knee: Secondary | ICD-10-CM | POA: Diagnosis not present

## 2022-08-05 DIAGNOSIS — M1711 Unilateral primary osteoarthritis, right knee: Secondary | ICD-10-CM | POA: Diagnosis not present

## 2022-08-12 DIAGNOSIS — K080 Exfoliation of teeth due to systemic causes: Secondary | ICD-10-CM | POA: Diagnosis not present

## 2022-08-20 DIAGNOSIS — K080 Exfoliation of teeth due to systemic causes: Secondary | ICD-10-CM | POA: Diagnosis not present

## 2022-09-16 DIAGNOSIS — M1711 Unilateral primary osteoarthritis, right knee: Secondary | ICD-10-CM | POA: Diagnosis not present

## 2022-11-25 DIAGNOSIS — E291 Testicular hypofunction: Secondary | ICD-10-CM | POA: Diagnosis not present

## 2022-11-25 DIAGNOSIS — C61 Malignant neoplasm of prostate: Secondary | ICD-10-CM | POA: Diagnosis not present

## 2022-12-02 DIAGNOSIS — N3041 Irradiation cystitis with hematuria: Secondary | ICD-10-CM | POA: Diagnosis not present

## 2022-12-02 DIAGNOSIS — R310 Gross hematuria: Secondary | ICD-10-CM | POA: Diagnosis not present

## 2022-12-02 DIAGNOSIS — C61 Malignant neoplasm of prostate: Secondary | ICD-10-CM | POA: Diagnosis not present

## 2022-12-02 DIAGNOSIS — N393 Stress incontinence (female) (male): Secondary | ICD-10-CM | POA: Diagnosis not present

## 2022-12-12 DIAGNOSIS — M542 Cervicalgia: Secondary | ICD-10-CM | POA: Diagnosis not present

## 2022-12-19 DIAGNOSIS — B029 Zoster without complications: Secondary | ICD-10-CM | POA: Diagnosis not present

## 2023-01-20 DIAGNOSIS — M1711 Unilateral primary osteoarthritis, right knee: Secondary | ICD-10-CM | POA: Diagnosis not present

## 2023-01-29 DIAGNOSIS — J209 Acute bronchitis, unspecified: Secondary | ICD-10-CM | POA: Diagnosis not present

## 2023-01-29 DIAGNOSIS — R062 Wheezing: Secondary | ICD-10-CM | POA: Diagnosis not present

## 2023-03-14 DIAGNOSIS — B029 Zoster without complications: Secondary | ICD-10-CM | POA: Diagnosis not present

## 2023-03-20 DIAGNOSIS — B029 Zoster without complications: Secondary | ICD-10-CM | POA: Diagnosis not present

## 2023-03-27 DIAGNOSIS — B029 Zoster without complications: Secondary | ICD-10-CM | POA: Diagnosis not present

## 2023-05-19 DIAGNOSIS — C61 Malignant neoplasm of prostate: Secondary | ICD-10-CM | POA: Diagnosis not present

## 2023-05-26 DIAGNOSIS — N393 Stress incontinence (female) (male): Secondary | ICD-10-CM | POA: Diagnosis not present

## 2023-05-26 DIAGNOSIS — C61 Malignant neoplasm of prostate: Secondary | ICD-10-CM | POA: Diagnosis not present

## 2023-05-26 DIAGNOSIS — R310 Gross hematuria: Secondary | ICD-10-CM | POA: Diagnosis not present

## 2023-05-26 DIAGNOSIS — N3041 Irradiation cystitis with hematuria: Secondary | ICD-10-CM | POA: Diagnosis not present

## 2023-06-23 DIAGNOSIS — M1711 Unilateral primary osteoarthritis, right knee: Secondary | ICD-10-CM | POA: Diagnosis not present

## 2023-07-02 DIAGNOSIS — E781 Pure hyperglyceridemia: Secondary | ICD-10-CM | POA: Diagnosis not present

## 2023-07-02 DIAGNOSIS — I10 Essential (primary) hypertension: Secondary | ICD-10-CM | POA: Diagnosis not present

## 2023-07-02 DIAGNOSIS — C61 Malignant neoplasm of prostate: Secondary | ICD-10-CM | POA: Diagnosis not present

## 2023-07-02 DIAGNOSIS — R7303 Prediabetes: Secondary | ICD-10-CM | POA: Diagnosis not present

## 2023-07-17 DIAGNOSIS — K5792 Diverticulitis of intestine, part unspecified, without perforation or abscess without bleeding: Secondary | ICD-10-CM | POA: Diagnosis not present

## 2024-05-04 DIAGNOSIS — Z09 Encounter for follow-up examination after completed treatment for conditions other than malignant neoplasm: Secondary | ICD-10-CM | POA: Diagnosis not present

## 2024-05-04 DIAGNOSIS — K5730 Diverticulosis of large intestine without perforation or abscess without bleeding: Secondary | ICD-10-CM | POA: Diagnosis not present

## 2024-05-04 DIAGNOSIS — K648 Other hemorrhoids: Secondary | ICD-10-CM | POA: Diagnosis not present

## 2024-05-04 DIAGNOSIS — Z860101 Personal history of adenomatous and serrated colon polyps: Secondary | ICD-10-CM | POA: Diagnosis not present

## 2024-05-04 DIAGNOSIS — D123 Benign neoplasm of transverse colon: Secondary | ICD-10-CM | POA: Diagnosis not present

## 2024-05-09 DIAGNOSIS — D123 Benign neoplasm of transverse colon: Secondary | ICD-10-CM | POA: Diagnosis not present

## 2024-05-10 DIAGNOSIS — L720 Epidermal cyst: Secondary | ICD-10-CM | POA: Diagnosis not present

## 2024-06-06 DIAGNOSIS — E782 Mixed hyperlipidemia: Secondary | ICD-10-CM | POA: Diagnosis not present

## 2024-06-06 DIAGNOSIS — R7303 Prediabetes: Secondary | ICD-10-CM | POA: Diagnosis not present

## 2024-06-06 DIAGNOSIS — I10 Essential (primary) hypertension: Secondary | ICD-10-CM | POA: Diagnosis not present

## 2024-06-06 DIAGNOSIS — N3041 Irradiation cystitis with hematuria: Secondary | ICD-10-CM | POA: Diagnosis not present

## 2024-06-08 DIAGNOSIS — I10 Essential (primary) hypertension: Secondary | ICD-10-CM | POA: Diagnosis not present

## 2024-06-08 DIAGNOSIS — Z0000 Encounter for general adult medical examination without abnormal findings: Secondary | ICD-10-CM | POA: Diagnosis not present

## 2024-06-08 DIAGNOSIS — R7303 Prediabetes: Secondary | ICD-10-CM | POA: Diagnosis not present

## 2024-06-08 DIAGNOSIS — E781 Pure hyperglyceridemia: Secondary | ICD-10-CM | POA: Diagnosis not present

## 2024-07-08 DIAGNOSIS — Z86018 Personal history of other benign neoplasm: Secondary | ICD-10-CM | POA: Diagnosis not present

## 2024-07-08 DIAGNOSIS — I10 Essential (primary) hypertension: Secondary | ICD-10-CM | POA: Diagnosis not present

## 2024-07-08 DIAGNOSIS — L723 Sebaceous cyst: Secondary | ICD-10-CM | POA: Diagnosis not present

## 2024-07-08 DIAGNOSIS — R7303 Prediabetes: Secondary | ICD-10-CM | POA: Diagnosis not present

## 2024-07-08 DIAGNOSIS — E781 Pure hyperglyceridemia: Secondary | ICD-10-CM | POA: Diagnosis not present

## 2024-07-08 DIAGNOSIS — K219 Gastro-esophageal reflux disease without esophagitis: Secondary | ICD-10-CM | POA: Diagnosis not present

## 2024-07-08 DIAGNOSIS — Z23 Encounter for immunization: Secondary | ICD-10-CM | POA: Diagnosis not present

## 2024-08-25 DIAGNOSIS — M25562 Pain in left knee: Secondary | ICD-10-CM | POA: Diagnosis not present
